# Patient Record
Sex: Female | Born: 1975
Health system: Southern US, Community
[De-identification: ages and names within clinical notes are randomized; demographics above are authoritative.]

## PROBLEM LIST (undated history)

## (undated) DIAGNOSIS — F419 Anxiety disorder, unspecified: Secondary | ICD-10-CM

## (undated) DIAGNOSIS — F309 Manic episode, unspecified: Secondary | ICD-10-CM

## (undated) DIAGNOSIS — F4310 Post-traumatic stress disorder, unspecified: Secondary | ICD-10-CM

## (undated) DIAGNOSIS — F329 Major depressive disorder, single episode, unspecified: Secondary | ICD-10-CM

## (undated) DIAGNOSIS — Z9989 Dependence on other enabling machines and devices: Secondary | ICD-10-CM

## (undated) DIAGNOSIS — R51 Headache: Secondary | ICD-10-CM

## (undated) DIAGNOSIS — E785 Hyperlipidemia, unspecified: Secondary | ICD-10-CM

## (undated) DIAGNOSIS — I10 Essential (primary) hypertension: Secondary | ICD-10-CM

## (undated) DIAGNOSIS — E669 Obesity, unspecified: Secondary | ICD-10-CM

## (undated) DIAGNOSIS — F29 Unspecified psychosis not due to a substance or known physiological condition: Secondary | ICD-10-CM

## (undated) DIAGNOSIS — G4733 Obstructive sleep apnea (adult) (pediatric): Secondary | ICD-10-CM

## (undated) DIAGNOSIS — F32A Depression, unspecified: Secondary | ICD-10-CM

## (undated) DIAGNOSIS — G932 Benign intracranial hypertension: Secondary | ICD-10-CM

## (undated) DIAGNOSIS — F840 Autistic disorder: Secondary | ICD-10-CM

## (undated) DIAGNOSIS — G4730 Sleep apnea, unspecified: Secondary | ICD-10-CM

## (undated) DIAGNOSIS — F819 Developmental disorder of scholastic skills, unspecified: Secondary | ICD-10-CM

## (undated) DIAGNOSIS — M545 Low back pain, unspecified: Secondary | ICD-10-CM

## (undated) DIAGNOSIS — M25559 Pain in unspecified hip: Secondary | ICD-10-CM

## (undated) DIAGNOSIS — F909 Attention-deficit hyperactivity disorder, unspecified type: Secondary | ICD-10-CM

## (undated) HISTORY — PX: FRACTURE SURGERY: SHX138

## (undated) HISTORY — DX: Major depressive disorder, single episode, unspecified: F32.9

## (undated) HISTORY — PX: CHOLECYSTECTOMY: SHX55

## (undated) HISTORY — DX: Essential (primary) hypertension: I10

## (undated) HISTORY — DX: Hyperlipidemia, unspecified: E78.5

## (undated) HISTORY — DX: Obesity, unspecified: E66.9

## (undated) HISTORY — DX: Post-traumatic stress disorder, unspecified: F43.10

## (undated) HISTORY — DX: Headache: R51

## (undated) HISTORY — DX: Manic episode, unspecified: F30.9

## (undated) HISTORY — DX: Depression, unspecified: F32.A

## (undated) HISTORY — DX: Dependence on other enabling machines and devices: Z99.89

## (undated) HISTORY — DX: Anxiety disorder, unspecified: F41.9

## (undated) HISTORY — DX: Obstructive sleep apnea (adult) (pediatric): G47.33

## (undated) HISTORY — DX: Unspecified psychosis not due to a substance or known physiological condition: F29

## (undated) HISTORY — DX: Low back pain, unspecified: M54.50

## (undated) HISTORY — DX: Sleep apnea, unspecified: G47.30

## (undated) HISTORY — DX: Autistic disorder: F84.0

## (undated) HISTORY — PX: ANKLE SURGERY: SHX546

## (undated) HISTORY — DX: Attention-deficit hyperactivity disorder, unspecified type: F90.9

## (undated) HISTORY — PX: HX GASTRIC SLEEVE: 2100003104

## (undated) HISTORY — DX: Pain in unspecified hip: M25.559

## (undated) HISTORY — DX: Developmental disorder of scholastic skills, unspecified: F81.9

## (undated) HISTORY — DX: Benign intracranial hypertension: G93.2

---

## 1997-09-23 ENCOUNTER — Emergency Department (HOSPITAL_COMMUNITY): Admission: EM | Admit: 1997-09-23 | Discharge: 1997-09-23 | Payer: Self-pay | Admitting: Emergency Medicine

## 1998-05-27 HISTORY — PX: OTHER SURGICAL HISTORY: SHX169

## 2005-05-27 HISTORY — PX: GALLBLADDER SURGERY: SHX652

## 2006-01-07 ENCOUNTER — Emergency Department (HOSPITAL_COMMUNITY): Admission: EM | Admit: 2006-01-07 | Discharge: 2006-01-07 | Payer: Self-pay | Admitting: Emergency Medicine

## 2006-01-09 ENCOUNTER — Inpatient Hospital Stay (HOSPITAL_COMMUNITY): Admission: EM | Admit: 2006-01-09 | Discharge: 2006-01-11 | Payer: Self-pay | Admitting: Emergency Medicine

## 2006-01-10 ENCOUNTER — Encounter (INDEPENDENT_AMBULATORY_CARE_PROVIDER_SITE_OTHER): Payer: Self-pay | Admitting: *Deleted

## 2006-01-22 ENCOUNTER — Ambulatory Visit (HOSPITAL_COMMUNITY): Payer: Self-pay | Admitting: Psychiatry

## 2006-02-05 ENCOUNTER — Ambulatory Visit (HOSPITAL_COMMUNITY): Payer: Self-pay | Admitting: Psychiatry

## 2006-02-19 ENCOUNTER — Ambulatory Visit (HOSPITAL_COMMUNITY): Payer: Self-pay | Admitting: Psychiatry

## 2006-06-04 ENCOUNTER — Ambulatory Visit (HOSPITAL_COMMUNITY): Payer: Self-pay | Admitting: Psychiatry

## 2006-07-14 ENCOUNTER — Ambulatory Visit (HOSPITAL_COMMUNITY): Payer: Self-pay | Admitting: Psychiatry

## 2006-07-31 ENCOUNTER — Ambulatory Visit (HOSPITAL_COMMUNITY): Payer: Self-pay | Admitting: Licensed Clinical Social Worker

## 2006-08-07 ENCOUNTER — Ambulatory Visit (HOSPITAL_COMMUNITY): Payer: Self-pay | Admitting: Licensed Clinical Social Worker

## 2006-08-25 ENCOUNTER — Ambulatory Visit (HOSPITAL_COMMUNITY): Payer: Self-pay | Admitting: Licensed Clinical Social Worker

## 2006-09-08 ENCOUNTER — Ambulatory Visit (HOSPITAL_COMMUNITY): Payer: Self-pay | Admitting: Psychiatry

## 2006-09-09 ENCOUNTER — Ambulatory Visit (HOSPITAL_COMMUNITY): Payer: Self-pay | Admitting: Licensed Clinical Social Worker

## 2006-09-16 ENCOUNTER — Ambulatory Visit (HOSPITAL_COMMUNITY): Payer: Self-pay | Admitting: Licensed Clinical Social Worker

## 2006-10-01 ENCOUNTER — Ambulatory Visit (HOSPITAL_COMMUNITY): Payer: Self-pay | Admitting: Licensed Clinical Social Worker

## 2006-10-21 ENCOUNTER — Ambulatory Visit (HOSPITAL_COMMUNITY): Payer: Self-pay | Admitting: Licensed Clinical Social Worker

## 2006-10-30 ENCOUNTER — Ambulatory Visit (HOSPITAL_COMMUNITY): Payer: Self-pay | Admitting: Licensed Clinical Social Worker

## 2006-11-10 ENCOUNTER — Ambulatory Visit (HOSPITAL_COMMUNITY): Payer: Self-pay | Admitting: Psychiatry

## 2006-11-12 ENCOUNTER — Ambulatory Visit (HOSPITAL_COMMUNITY): Payer: Self-pay | Admitting: Licensed Clinical Social Worker

## 2006-11-18 ENCOUNTER — Ambulatory Visit (HOSPITAL_COMMUNITY): Payer: Self-pay | Admitting: Licensed Clinical Social Worker

## 2006-11-25 ENCOUNTER — Ambulatory Visit (HOSPITAL_COMMUNITY): Payer: Self-pay | Admitting: Licensed Clinical Social Worker

## 2006-12-02 ENCOUNTER — Ambulatory Visit (HOSPITAL_COMMUNITY): Payer: Self-pay | Admitting: Licensed Clinical Social Worker

## 2006-12-09 ENCOUNTER — Ambulatory Visit (HOSPITAL_COMMUNITY): Payer: Self-pay | Admitting: Licensed Clinical Social Worker

## 2006-12-16 ENCOUNTER — Ambulatory Visit (HOSPITAL_COMMUNITY): Payer: Self-pay | Admitting: Licensed Clinical Social Worker

## 2006-12-24 ENCOUNTER — Ambulatory Visit (HOSPITAL_COMMUNITY): Payer: Self-pay | Admitting: Licensed Clinical Social Worker

## 2006-12-31 ENCOUNTER — Ambulatory Visit (HOSPITAL_COMMUNITY): Payer: Self-pay | Admitting: Licensed Clinical Social Worker

## 2007-01-05 ENCOUNTER — Ambulatory Visit (HOSPITAL_COMMUNITY): Payer: Self-pay | Admitting: Psychiatry

## 2007-01-07 ENCOUNTER — Ambulatory Visit (HOSPITAL_COMMUNITY): Payer: Self-pay | Admitting: Licensed Clinical Social Worker

## 2007-01-21 ENCOUNTER — Ambulatory Visit (HOSPITAL_COMMUNITY): Payer: Self-pay | Admitting: Licensed Clinical Social Worker

## 2007-02-04 ENCOUNTER — Ambulatory Visit (HOSPITAL_COMMUNITY): Payer: Self-pay | Admitting: Psychiatry

## 2007-02-10 ENCOUNTER — Ambulatory Visit (HOSPITAL_COMMUNITY): Payer: Self-pay | Admitting: Licensed Clinical Social Worker

## 2007-02-17 ENCOUNTER — Ambulatory Visit (HOSPITAL_COMMUNITY): Payer: Self-pay | Admitting: Licensed Clinical Social Worker

## 2007-02-23 ENCOUNTER — Ambulatory Visit (HOSPITAL_COMMUNITY): Payer: Self-pay | Admitting: Licensed Clinical Social Worker

## 2007-03-03 ENCOUNTER — Ambulatory Visit (HOSPITAL_COMMUNITY): Payer: Self-pay | Admitting: Licensed Clinical Social Worker

## 2007-03-04 ENCOUNTER — Ambulatory Visit (HOSPITAL_COMMUNITY): Payer: Self-pay | Admitting: Psychiatry

## 2007-03-10 ENCOUNTER — Ambulatory Visit (HOSPITAL_COMMUNITY): Payer: Self-pay | Admitting: Licensed Clinical Social Worker

## 2007-03-17 ENCOUNTER — Ambulatory Visit (HOSPITAL_COMMUNITY): Payer: Self-pay | Admitting: Licensed Clinical Social Worker

## 2007-03-31 ENCOUNTER — Ambulatory Visit (HOSPITAL_COMMUNITY): Payer: Self-pay | Admitting: Licensed Clinical Social Worker

## 2007-04-03 ENCOUNTER — Ambulatory Visit (HOSPITAL_COMMUNITY): Payer: Self-pay | Admitting: Psychiatry

## 2007-04-07 ENCOUNTER — Ambulatory Visit (HOSPITAL_COMMUNITY): Payer: Self-pay | Admitting: Licensed Clinical Social Worker

## 2007-04-14 ENCOUNTER — Ambulatory Visit (HOSPITAL_COMMUNITY): Payer: Self-pay | Admitting: Licensed Clinical Social Worker

## 2007-04-21 ENCOUNTER — Ambulatory Visit (HOSPITAL_COMMUNITY): Payer: Self-pay | Admitting: Licensed Clinical Social Worker

## 2007-04-27 ENCOUNTER — Ambulatory Visit (HOSPITAL_COMMUNITY): Payer: Self-pay | Admitting: Licensed Clinical Social Worker

## 2007-05-04 ENCOUNTER — Ambulatory Visit (HOSPITAL_COMMUNITY): Payer: Self-pay | Admitting: Licensed Clinical Social Worker

## 2007-05-08 ENCOUNTER — Ambulatory Visit (HOSPITAL_COMMUNITY): Payer: Self-pay | Admitting: Psychiatry

## 2007-05-11 ENCOUNTER — Ambulatory Visit (HOSPITAL_COMMUNITY): Payer: Self-pay | Admitting: Licensed Clinical Social Worker

## 2007-05-18 ENCOUNTER — Ambulatory Visit (HOSPITAL_COMMUNITY): Payer: Self-pay | Admitting: Licensed Clinical Social Worker

## 2007-05-25 ENCOUNTER — Ambulatory Visit (HOSPITAL_COMMUNITY): Payer: Self-pay | Admitting: Licensed Clinical Social Worker

## 2007-06-15 ENCOUNTER — Ambulatory Visit (HOSPITAL_COMMUNITY): Payer: Self-pay | Admitting: Licensed Clinical Social Worker

## 2007-06-25 ENCOUNTER — Ambulatory Visit (HOSPITAL_COMMUNITY): Payer: Self-pay | Admitting: Licensed Clinical Social Worker

## 2007-07-02 ENCOUNTER — Ambulatory Visit (HOSPITAL_COMMUNITY): Payer: Self-pay | Admitting: Licensed Clinical Social Worker

## 2007-07-23 ENCOUNTER — Ambulatory Visit (HOSPITAL_COMMUNITY): Payer: Self-pay | Admitting: Licensed Clinical Social Worker

## 2007-07-29 ENCOUNTER — Ambulatory Visit (HOSPITAL_COMMUNITY): Payer: Self-pay | Admitting: Licensed Clinical Social Worker

## 2007-08-05 ENCOUNTER — Ambulatory Visit (HOSPITAL_COMMUNITY): Payer: Self-pay | Admitting: Licensed Clinical Social Worker

## 2007-08-12 ENCOUNTER — Ambulatory Visit (HOSPITAL_COMMUNITY): Payer: Self-pay | Admitting: Licensed Clinical Social Worker

## 2007-08-19 ENCOUNTER — Ambulatory Visit (HOSPITAL_COMMUNITY): Payer: Self-pay | Admitting: Licensed Clinical Social Worker

## 2007-08-26 ENCOUNTER — Ambulatory Visit (HOSPITAL_COMMUNITY): Payer: Self-pay | Admitting: Licensed Clinical Social Worker

## 2007-09-02 ENCOUNTER — Ambulatory Visit (HOSPITAL_COMMUNITY): Payer: Self-pay | Admitting: Licensed Clinical Social Worker

## 2007-09-09 ENCOUNTER — Ambulatory Visit (HOSPITAL_COMMUNITY): Payer: Self-pay | Admitting: Licensed Clinical Social Worker

## 2007-09-30 ENCOUNTER — Ambulatory Visit (HOSPITAL_COMMUNITY): Payer: Self-pay | Admitting: Licensed Clinical Social Worker

## 2007-10-12 ENCOUNTER — Ambulatory Visit (HOSPITAL_COMMUNITY): Payer: Self-pay | Admitting: Licensed Clinical Social Worker

## 2007-10-20 ENCOUNTER — Ambulatory Visit (HOSPITAL_COMMUNITY): Payer: Self-pay | Admitting: Licensed Clinical Social Worker

## 2007-10-26 ENCOUNTER — Ambulatory Visit (HOSPITAL_COMMUNITY): Payer: Self-pay | Admitting: Licensed Clinical Social Worker

## 2007-11-02 ENCOUNTER — Ambulatory Visit (HOSPITAL_COMMUNITY): Payer: Self-pay | Admitting: Licensed Clinical Social Worker

## 2007-11-09 ENCOUNTER — Ambulatory Visit (HOSPITAL_COMMUNITY): Payer: Self-pay | Admitting: Licensed Clinical Social Worker

## 2007-11-16 ENCOUNTER — Ambulatory Visit (HOSPITAL_COMMUNITY): Payer: Self-pay | Admitting: Licensed Clinical Social Worker

## 2007-12-07 ENCOUNTER — Ambulatory Visit (HOSPITAL_COMMUNITY): Payer: Self-pay | Admitting: Licensed Clinical Social Worker

## 2007-12-14 ENCOUNTER — Ambulatory Visit (HOSPITAL_COMMUNITY): Payer: Self-pay | Admitting: Licensed Clinical Social Worker

## 2007-12-21 ENCOUNTER — Ambulatory Visit (HOSPITAL_COMMUNITY): Payer: Self-pay | Admitting: Licensed Clinical Social Worker

## 2007-12-28 ENCOUNTER — Ambulatory Visit (HOSPITAL_COMMUNITY): Payer: Self-pay | Admitting: Licensed Clinical Social Worker

## 2008-01-04 ENCOUNTER — Ambulatory Visit (HOSPITAL_COMMUNITY): Payer: Self-pay | Admitting: Licensed Clinical Social Worker

## 2008-01-08 ENCOUNTER — Ambulatory Visit (HOSPITAL_COMMUNITY): Payer: Self-pay | Admitting: Psychiatry

## 2008-01-12 ENCOUNTER — Ambulatory Visit (HOSPITAL_COMMUNITY): Payer: Self-pay | Admitting: Licensed Clinical Social Worker

## 2008-01-18 ENCOUNTER — Ambulatory Visit (HOSPITAL_COMMUNITY): Payer: Self-pay | Admitting: Licensed Clinical Social Worker

## 2008-01-25 ENCOUNTER — Ambulatory Visit (HOSPITAL_COMMUNITY): Payer: Self-pay | Admitting: Licensed Clinical Social Worker

## 2008-02-08 ENCOUNTER — Ambulatory Visit (HOSPITAL_COMMUNITY): Payer: Self-pay | Admitting: Licensed Clinical Social Worker

## 2008-02-15 ENCOUNTER — Ambulatory Visit (HOSPITAL_COMMUNITY): Payer: Self-pay | Admitting: Licensed Clinical Social Worker

## 2008-02-22 ENCOUNTER — Ambulatory Visit (HOSPITAL_COMMUNITY): Payer: Self-pay | Admitting: Licensed Clinical Social Worker

## 2008-02-29 ENCOUNTER — Ambulatory Visit (HOSPITAL_COMMUNITY): Payer: Self-pay | Admitting: Licensed Clinical Social Worker

## 2008-03-02 ENCOUNTER — Ambulatory Visit (HOSPITAL_COMMUNITY): Payer: Self-pay | Admitting: Psychiatry

## 2008-03-07 ENCOUNTER — Ambulatory Visit (HOSPITAL_COMMUNITY): Payer: Self-pay | Admitting: Licensed Clinical Social Worker

## 2008-03-14 ENCOUNTER — Ambulatory Visit (HOSPITAL_COMMUNITY): Payer: Self-pay | Admitting: Licensed Clinical Social Worker

## 2008-03-21 ENCOUNTER — Ambulatory Visit (HOSPITAL_COMMUNITY): Payer: Self-pay | Admitting: Licensed Clinical Social Worker

## 2008-03-28 ENCOUNTER — Ambulatory Visit (HOSPITAL_COMMUNITY): Payer: Self-pay | Admitting: Licensed Clinical Social Worker

## 2008-04-04 ENCOUNTER — Ambulatory Visit (HOSPITAL_COMMUNITY): Payer: Self-pay | Admitting: Licensed Clinical Social Worker

## 2008-04-11 ENCOUNTER — Ambulatory Visit (HOSPITAL_COMMUNITY): Payer: Self-pay | Admitting: Licensed Clinical Social Worker

## 2008-04-18 ENCOUNTER — Ambulatory Visit (HOSPITAL_COMMUNITY): Payer: Self-pay | Admitting: Licensed Clinical Social Worker

## 2008-04-25 ENCOUNTER — Ambulatory Visit (HOSPITAL_COMMUNITY): Payer: Self-pay | Admitting: Licensed Clinical Social Worker

## 2008-04-27 ENCOUNTER — Ambulatory Visit (HOSPITAL_COMMUNITY): Payer: Self-pay | Admitting: Psychiatry

## 2008-05-02 ENCOUNTER — Ambulatory Visit (HOSPITAL_COMMUNITY): Payer: Self-pay | Admitting: Licensed Clinical Social Worker

## 2008-05-16 ENCOUNTER — Ambulatory Visit (HOSPITAL_COMMUNITY): Payer: Self-pay | Admitting: Licensed Clinical Social Worker

## 2008-05-23 ENCOUNTER — Ambulatory Visit (HOSPITAL_COMMUNITY): Payer: Self-pay | Admitting: Licensed Clinical Social Worker

## 2008-06-01 ENCOUNTER — Ambulatory Visit (HOSPITAL_COMMUNITY): Payer: Self-pay | Admitting: Licensed Clinical Social Worker

## 2008-06-07 ENCOUNTER — Ambulatory Visit (HOSPITAL_COMMUNITY): Payer: Self-pay | Admitting: Licensed Clinical Social Worker

## 2008-06-15 ENCOUNTER — Ambulatory Visit (HOSPITAL_COMMUNITY): Payer: Self-pay | Admitting: Licensed Clinical Social Worker

## 2008-06-20 ENCOUNTER — Ambulatory Visit (HOSPITAL_COMMUNITY): Payer: Self-pay | Admitting: Psychiatry

## 2008-06-22 ENCOUNTER — Ambulatory Visit (HOSPITAL_COMMUNITY): Payer: Self-pay | Admitting: Licensed Clinical Social Worker

## 2008-07-06 ENCOUNTER — Ambulatory Visit (HOSPITAL_COMMUNITY): Payer: Self-pay | Admitting: Licensed Clinical Social Worker

## 2008-07-20 ENCOUNTER — Ambulatory Visit (HOSPITAL_COMMUNITY): Payer: Self-pay | Admitting: Licensed Clinical Social Worker

## 2008-07-27 ENCOUNTER — Ambulatory Visit (HOSPITAL_COMMUNITY): Payer: Self-pay | Admitting: Licensed Clinical Social Worker

## 2008-08-17 ENCOUNTER — Ambulatory Visit (HOSPITAL_COMMUNITY): Payer: Self-pay | Admitting: Licensed Clinical Social Worker

## 2008-08-24 ENCOUNTER — Ambulatory Visit (HOSPITAL_COMMUNITY): Payer: Self-pay | Admitting: Licensed Clinical Social Worker

## 2008-08-31 ENCOUNTER — Ambulatory Visit (HOSPITAL_COMMUNITY): Payer: Self-pay | Admitting: Licensed Clinical Social Worker

## 2008-09-07 ENCOUNTER — Ambulatory Visit (HOSPITAL_COMMUNITY): Payer: Self-pay | Admitting: Licensed Clinical Social Worker

## 2008-09-14 ENCOUNTER — Ambulatory Visit (HOSPITAL_COMMUNITY): Payer: Self-pay | Admitting: Licensed Clinical Social Worker

## 2008-09-30 ENCOUNTER — Ambulatory Visit (HOSPITAL_COMMUNITY): Payer: Self-pay | Admitting: Licensed Clinical Social Worker

## 2008-10-07 ENCOUNTER — Ambulatory Visit (HOSPITAL_COMMUNITY): Payer: Self-pay | Admitting: Licensed Clinical Social Worker

## 2008-10-13 ENCOUNTER — Ambulatory Visit (HOSPITAL_COMMUNITY): Payer: Self-pay | Admitting: Licensed Clinical Social Worker

## 2008-10-18 ENCOUNTER — Ambulatory Visit (HOSPITAL_COMMUNITY): Payer: Self-pay | Admitting: Licensed Clinical Social Worker

## 2008-10-25 ENCOUNTER — Ambulatory Visit (HOSPITAL_COMMUNITY): Payer: Self-pay | Admitting: Licensed Clinical Social Worker

## 2008-11-01 ENCOUNTER — Ambulatory Visit (HOSPITAL_COMMUNITY): Payer: Self-pay | Admitting: Licensed Clinical Social Worker

## 2008-11-08 ENCOUNTER — Ambulatory Visit (HOSPITAL_COMMUNITY): Payer: Self-pay | Admitting: Licensed Clinical Social Worker

## 2008-11-11 ENCOUNTER — Ambulatory Visit (HOSPITAL_COMMUNITY): Payer: Self-pay | Admitting: Licensed Clinical Social Worker

## 2008-11-15 ENCOUNTER — Ambulatory Visit (HOSPITAL_COMMUNITY): Payer: Self-pay | Admitting: Licensed Clinical Social Worker

## 2008-11-29 ENCOUNTER — Ambulatory Visit (HOSPITAL_COMMUNITY): Payer: Self-pay | Admitting: Licensed Clinical Social Worker

## 2008-12-06 ENCOUNTER — Ambulatory Visit (HOSPITAL_COMMUNITY): Payer: Self-pay | Admitting: Licensed Clinical Social Worker

## 2008-12-13 ENCOUNTER — Ambulatory Visit (HOSPITAL_COMMUNITY): Payer: Self-pay | Admitting: Licensed Clinical Social Worker

## 2008-12-20 ENCOUNTER — Ambulatory Visit (HOSPITAL_COMMUNITY): Payer: Self-pay | Admitting: Licensed Clinical Social Worker

## 2008-12-27 ENCOUNTER — Ambulatory Visit (HOSPITAL_COMMUNITY): Payer: Self-pay | Admitting: Licensed Clinical Social Worker

## 2009-01-04 ENCOUNTER — Ambulatory Visit (HOSPITAL_COMMUNITY): Payer: Self-pay | Admitting: Licensed Clinical Social Worker

## 2009-01-10 ENCOUNTER — Ambulatory Visit (HOSPITAL_COMMUNITY): Payer: Self-pay | Admitting: Licensed Clinical Social Worker

## 2009-01-16 ENCOUNTER — Ambulatory Visit (HOSPITAL_COMMUNITY): Payer: Self-pay | Admitting: Licensed Clinical Social Worker

## 2009-01-23 ENCOUNTER — Ambulatory Visit (HOSPITAL_COMMUNITY): Payer: Self-pay | Admitting: Licensed Clinical Social Worker

## 2009-02-01 ENCOUNTER — Ambulatory Visit (HOSPITAL_COMMUNITY): Payer: Self-pay | Admitting: Licensed Clinical Social Worker

## 2009-02-08 ENCOUNTER — Ambulatory Visit (HOSPITAL_COMMUNITY): Payer: Self-pay | Admitting: Licensed Clinical Social Worker

## 2009-02-22 ENCOUNTER — Ambulatory Visit (HOSPITAL_COMMUNITY): Payer: Self-pay | Admitting: Licensed Clinical Social Worker

## 2009-03-01 ENCOUNTER — Ambulatory Visit (HOSPITAL_COMMUNITY): Payer: Self-pay | Admitting: Licensed Clinical Social Worker

## 2009-03-08 ENCOUNTER — Ambulatory Visit (HOSPITAL_COMMUNITY): Payer: Self-pay | Admitting: Licensed Clinical Social Worker

## 2009-03-15 ENCOUNTER — Ambulatory Visit (HOSPITAL_COMMUNITY): Payer: Self-pay | Admitting: Licensed Clinical Social Worker

## 2009-03-19 ENCOUNTER — Emergency Department (HOSPITAL_COMMUNITY): Admission: EM | Admit: 2009-03-19 | Discharge: 2009-03-19 | Payer: Self-pay | Admitting: Emergency Medicine

## 2009-03-29 ENCOUNTER — Ambulatory Visit (HOSPITAL_COMMUNITY): Payer: Self-pay | Admitting: Licensed Clinical Social Worker

## 2009-04-05 ENCOUNTER — Ambulatory Visit (HOSPITAL_COMMUNITY): Payer: Self-pay | Admitting: Licensed Clinical Social Worker

## 2009-04-12 ENCOUNTER — Ambulatory Visit (HOSPITAL_COMMUNITY): Payer: Self-pay | Admitting: Licensed Clinical Social Worker

## 2009-04-19 ENCOUNTER — Ambulatory Visit (HOSPITAL_COMMUNITY): Payer: Self-pay | Admitting: Licensed Clinical Social Worker

## 2009-05-04 ENCOUNTER — Ambulatory Visit (HOSPITAL_COMMUNITY): Payer: Self-pay | Admitting: Licensed Clinical Social Worker

## 2009-05-10 ENCOUNTER — Ambulatory Visit (HOSPITAL_COMMUNITY): Payer: Self-pay | Admitting: Licensed Clinical Social Worker

## 2009-05-17 ENCOUNTER — Ambulatory Visit (HOSPITAL_COMMUNITY): Payer: Self-pay | Admitting: Licensed Clinical Social Worker

## 2009-05-24 ENCOUNTER — Ambulatory Visit (HOSPITAL_COMMUNITY): Payer: Self-pay | Admitting: Licensed Clinical Social Worker

## 2009-06-01 ENCOUNTER — Ambulatory Visit (HOSPITAL_COMMUNITY): Payer: Self-pay | Admitting: Licensed Clinical Social Worker

## 2009-06-08 ENCOUNTER — Ambulatory Visit (HOSPITAL_COMMUNITY): Payer: Self-pay | Admitting: Licensed Clinical Social Worker

## 2009-06-08 ENCOUNTER — Ambulatory Visit (HOSPITAL_COMMUNITY): Admission: RE | Admit: 2009-06-08 | Discharge: 2009-06-08 | Payer: Self-pay | Admitting: Obstetrics and Gynecology

## 2009-06-15 ENCOUNTER — Ambulatory Visit (HOSPITAL_COMMUNITY): Payer: Self-pay | Admitting: Licensed Clinical Social Worker

## 2009-06-22 ENCOUNTER — Ambulatory Visit (HOSPITAL_COMMUNITY): Payer: Self-pay | Admitting: Licensed Clinical Social Worker

## 2009-06-29 ENCOUNTER — Ambulatory Visit (HOSPITAL_COMMUNITY): Payer: Self-pay | Admitting: Licensed Clinical Social Worker

## 2009-07-06 ENCOUNTER — Ambulatory Visit (HOSPITAL_COMMUNITY): Payer: Self-pay | Admitting: Licensed Clinical Social Worker

## 2009-07-28 ENCOUNTER — Ambulatory Visit (HOSPITAL_COMMUNITY): Payer: Self-pay | Admitting: Licensed Clinical Social Worker

## 2009-08-04 ENCOUNTER — Ambulatory Visit (HOSPITAL_COMMUNITY): Payer: Self-pay | Admitting: Licensed Clinical Social Worker

## 2009-08-10 ENCOUNTER — Ambulatory Visit (HOSPITAL_COMMUNITY): Payer: Self-pay | Admitting: Licensed Clinical Social Worker

## 2009-08-25 ENCOUNTER — Ambulatory Visit (HOSPITAL_COMMUNITY): Payer: Self-pay | Admitting: Licensed Clinical Social Worker

## 2009-08-31 ENCOUNTER — Ambulatory Visit (HOSPITAL_COMMUNITY): Payer: Self-pay | Admitting: Licensed Clinical Social Worker

## 2009-09-07 ENCOUNTER — Ambulatory Visit (HOSPITAL_COMMUNITY): Payer: Self-pay | Admitting: Licensed Clinical Social Worker

## 2009-09-11 ENCOUNTER — Ambulatory Visit (HOSPITAL_COMMUNITY): Payer: Self-pay | Admitting: Licensed Clinical Social Worker

## 2009-09-18 ENCOUNTER — Ambulatory Visit (HOSPITAL_COMMUNITY): Payer: Self-pay | Admitting: Licensed Clinical Social Worker

## 2009-10-06 ENCOUNTER — Ambulatory Visit (HOSPITAL_COMMUNITY): Payer: Self-pay | Admitting: Licensed Clinical Social Worker

## 2009-10-12 ENCOUNTER — Ambulatory Visit (HOSPITAL_COMMUNITY): Payer: Self-pay | Admitting: Licensed Clinical Social Worker

## 2009-10-17 ENCOUNTER — Ambulatory Visit (HOSPITAL_COMMUNITY): Payer: Self-pay | Admitting: Licensed Clinical Social Worker

## 2009-10-25 ENCOUNTER — Ambulatory Visit (HOSPITAL_COMMUNITY): Payer: Self-pay | Admitting: Licensed Clinical Social Worker

## 2009-10-31 ENCOUNTER — Ambulatory Visit (HOSPITAL_COMMUNITY): Payer: Self-pay | Admitting: Licensed Clinical Social Worker

## 2009-11-07 ENCOUNTER — Ambulatory Visit (HOSPITAL_COMMUNITY): Payer: Self-pay | Admitting: Licensed Clinical Social Worker

## 2009-11-14 ENCOUNTER — Ambulatory Visit (HOSPITAL_COMMUNITY): Payer: Self-pay | Admitting: Licensed Clinical Social Worker

## 2009-11-23 ENCOUNTER — Ambulatory Visit (HOSPITAL_COMMUNITY): Payer: Self-pay | Admitting: Licensed Clinical Social Worker

## 2009-11-28 ENCOUNTER — Ambulatory Visit (HOSPITAL_COMMUNITY): Payer: Self-pay | Admitting: Licensed Clinical Social Worker

## 2009-12-07 ENCOUNTER — Ambulatory Visit (HOSPITAL_COMMUNITY): Payer: Self-pay | Admitting: Licensed Clinical Social Worker

## 2009-12-14 ENCOUNTER — Ambulatory Visit (HOSPITAL_COMMUNITY): Payer: Self-pay | Admitting: Licensed Clinical Social Worker

## 2009-12-20 ENCOUNTER — Ambulatory Visit (HOSPITAL_COMMUNITY): Payer: Self-pay | Admitting: Psychiatry

## 2009-12-21 ENCOUNTER — Ambulatory Visit (HOSPITAL_COMMUNITY): Payer: Self-pay | Admitting: Licensed Clinical Social Worker

## 2009-12-27 ENCOUNTER — Ambulatory Visit (HOSPITAL_COMMUNITY): Payer: Self-pay | Admitting: Licensed Clinical Social Worker

## 2010-01-04 ENCOUNTER — Ambulatory Visit (HOSPITAL_COMMUNITY): Payer: Self-pay | Admitting: Licensed Clinical Social Worker

## 2010-01-08 ENCOUNTER — Ambulatory Visit (HOSPITAL_COMMUNITY): Payer: Self-pay | Admitting: Licensed Clinical Social Worker

## 2010-01-16 ENCOUNTER — Ambulatory Visit (HOSPITAL_COMMUNITY): Payer: Self-pay | Admitting: Licensed Clinical Social Worker

## 2010-01-23 ENCOUNTER — Ambulatory Visit (HOSPITAL_COMMUNITY): Payer: Self-pay | Admitting: Licensed Clinical Social Worker

## 2010-02-01 ENCOUNTER — Ambulatory Visit (HOSPITAL_COMMUNITY): Payer: Self-pay | Admitting: Licensed Clinical Social Worker

## 2010-02-06 ENCOUNTER — Ambulatory Visit (HOSPITAL_COMMUNITY): Payer: Self-pay | Admitting: Licensed Clinical Social Worker

## 2010-02-16 ENCOUNTER — Ambulatory Visit (HOSPITAL_COMMUNITY): Payer: Self-pay | Admitting: Licensed Clinical Social Worker

## 2010-02-27 ENCOUNTER — Ambulatory Visit (HOSPITAL_COMMUNITY): Payer: Self-pay | Admitting: Licensed Clinical Social Worker

## 2010-03-06 ENCOUNTER — Ambulatory Visit (HOSPITAL_COMMUNITY): Payer: Self-pay | Admitting: Licensed Clinical Social Worker

## 2010-03-13 ENCOUNTER — Ambulatory Visit (HOSPITAL_COMMUNITY): Payer: Self-pay | Admitting: Licensed Clinical Social Worker

## 2010-03-23 ENCOUNTER — Ambulatory Visit (HOSPITAL_COMMUNITY): Payer: Self-pay | Admitting: Licensed Clinical Social Worker

## 2010-03-29 ENCOUNTER — Ambulatory Visit (HOSPITAL_COMMUNITY): Payer: Self-pay | Admitting: Licensed Clinical Social Worker

## 2010-04-05 ENCOUNTER — Ambulatory Visit (HOSPITAL_COMMUNITY): Payer: Self-pay | Admitting: Licensed Clinical Social Worker

## 2010-04-12 ENCOUNTER — Ambulatory Visit (HOSPITAL_COMMUNITY): Payer: Self-pay | Admitting: Licensed Clinical Social Worker

## 2010-04-17 ENCOUNTER — Ambulatory Visit (HOSPITAL_COMMUNITY): Payer: Self-pay | Admitting: Licensed Clinical Social Worker

## 2010-04-23 ENCOUNTER — Ambulatory Visit (HOSPITAL_COMMUNITY): Payer: Self-pay | Admitting: Licensed Clinical Social Worker

## 2010-05-01 ENCOUNTER — Ambulatory Visit (HOSPITAL_COMMUNITY): Payer: Self-pay | Admitting: Licensed Clinical Social Worker

## 2010-05-08 ENCOUNTER — Ambulatory Visit (HOSPITAL_COMMUNITY): Payer: Self-pay | Admitting: Licensed Clinical Social Worker

## 2010-05-15 ENCOUNTER — Ambulatory Visit (HOSPITAL_COMMUNITY): Payer: Self-pay | Admitting: Licensed Clinical Social Worker

## 2010-05-29 ENCOUNTER — Ambulatory Visit (HOSPITAL_COMMUNITY)
Admission: RE | Admit: 2010-05-29 | Discharge: 2010-05-29 | Payer: Self-pay | Source: Home / Self Care | Attending: Licensed Clinical Social Worker | Admitting: Licensed Clinical Social Worker

## 2010-06-04 ENCOUNTER — Ambulatory Visit (HOSPITAL_COMMUNITY)
Admission: RE | Admit: 2010-06-04 | Discharge: 2010-06-04 | Payer: Self-pay | Source: Home / Self Care | Attending: Licensed Clinical Social Worker | Admitting: Licensed Clinical Social Worker

## 2010-06-15 ENCOUNTER — Ambulatory Visit (HOSPITAL_COMMUNITY)
Admission: RE | Admit: 2010-06-15 | Discharge: 2010-06-15 | Payer: Self-pay | Source: Home / Self Care | Attending: Licensed Clinical Social Worker | Admitting: Licensed Clinical Social Worker

## 2010-06-16 ENCOUNTER — Encounter: Payer: Self-pay | Admitting: Neurology

## 2010-06-17 ENCOUNTER — Encounter: Payer: Self-pay | Admitting: Neurology

## 2010-06-22 ENCOUNTER — Ambulatory Visit (HOSPITAL_COMMUNITY)
Admission: RE | Admit: 2010-06-22 | Discharge: 2010-06-22 | Payer: Self-pay | Source: Home / Self Care | Attending: Licensed Clinical Social Worker | Admitting: Licensed Clinical Social Worker

## 2010-06-29 ENCOUNTER — Ambulatory Visit (HOSPITAL_COMMUNITY): Admit: 2010-06-29 | Payer: Self-pay | Admitting: Licensed Clinical Social Worker

## 2010-06-29 ENCOUNTER — Encounter (INDEPENDENT_AMBULATORY_CARE_PROVIDER_SITE_OTHER): Payer: Medicaid Other | Admitting: Licensed Clinical Social Worker

## 2010-07-05 ENCOUNTER — Encounter (HOSPITAL_COMMUNITY): Payer: Medicare Other | Admitting: Licensed Clinical Social Worker

## 2010-07-13 ENCOUNTER — Encounter (HOSPITAL_COMMUNITY): Payer: Medicare Other | Admitting: Licensed Clinical Social Worker

## 2010-08-03 ENCOUNTER — Encounter (HOSPITAL_COMMUNITY): Payer: Medicare Other | Admitting: Licensed Clinical Social Worker

## 2010-08-10 ENCOUNTER — Encounter (HOSPITAL_COMMUNITY): Payer: Medicare Other | Admitting: Licensed Clinical Social Worker

## 2010-08-17 ENCOUNTER — Encounter (HOSPITAL_COMMUNITY): Payer: Medicare Other | Admitting: Licensed Clinical Social Worker

## 2010-08-30 LAB — CBC
Platelets: 205 10*3/uL (ref 150–400)
RDW: 15.1 % (ref 11.5–15.5)
WBC: 8.7 10*3/uL (ref 4.0–10.5)

## 2010-08-30 LAB — BASIC METABOLIC PANEL
BUN: 10 mg/dL (ref 6–23)
Creatinine, Ser: 0.53 mg/dL (ref 0.4–1.2)
GFR calc non Af Amer: >60 mL/min (ref >60)
Glucose, Bld: 117 mg/dL — ABNORMAL HIGH (ref 70–99)

## 2010-08-30 LAB — DIFFERENTIAL
Basophils Absolute: 0.2 10*3/uL — ABNORMAL HIGH (ref 0.0–0.1)
Eosinophils Absolute: 0.2 10*3/uL (ref 0.0–0.7)
Eosinophils Relative: 3 % (ref 0–5)
Lymphocytes Relative: 26 % (ref 12–46)
Lymphs Abs: 2.2 10*3/uL (ref 0.7–4.0)
Neutrophils Relative %: 65 % (ref 43–77)

## 2010-08-30 LAB — D-DIMER, QUANTITATIVE: D-Dimer, Quant: 0.24 ug/mL-FEU (ref 0.00–0.48)

## 2010-08-31 ENCOUNTER — Encounter (HOSPITAL_COMMUNITY): Payer: Medicare Other | Admitting: Licensed Clinical Social Worker

## 2010-09-07 ENCOUNTER — Encounter (HOSPITAL_BASED_OUTPATIENT_CLINIC_OR_DEPARTMENT_OTHER): Payer: Medicare Other | Admitting: Licensed Clinical Social Worker

## 2010-09-13 ENCOUNTER — Encounter (HOSPITAL_BASED_OUTPATIENT_CLINIC_OR_DEPARTMENT_OTHER): Payer: Medicare Other | Admitting: Licensed Clinical Social Worker

## 2010-09-21 ENCOUNTER — Encounter (HOSPITAL_BASED_OUTPATIENT_CLINIC_OR_DEPARTMENT_OTHER): Payer: Medicare Other | Admitting: Licensed Clinical Social Worker

## 2010-09-27 ENCOUNTER — Encounter (HOSPITAL_BASED_OUTPATIENT_CLINIC_OR_DEPARTMENT_OTHER): Payer: Medicare Other | Admitting: Licensed Clinical Social Worker

## 2010-10-08 ENCOUNTER — Encounter (HOSPITAL_COMMUNITY): Payer: Medicare Other | Admitting: Licensed Clinical Social Worker

## 2010-10-12 NOTE — H&P (Signed)
NAMESAMMYE, STAFF             ACCOUNT NO.:  [id number]   MEDICAL RECORD NO.:  [id number]          PATIENT TYPE:  INP   LOCATION:  5153                         FACILITY:  [facility]   PHYSICIAN:  Cherylynn Ridges, M.D.    DATE OF BIRTH:  05-23-1976   DATE OF ADMISSION:  01/09/2006  DATE OF DISCHARGE:                                HISTORY & PHYSICAL   CHIEF COMPLAINT:  The patient is a 35 year old morbidly obese female with  acute cholecystitis and cholelithiasis.   HISTORY OF PRESENT ILLNESS:  The patient was seen in the emergency room  approximately 2-1/2 to 3 days ago with abdominal pain in the right upper  quadrant.  Workup at that time demonstrated the patient had gallstones.  However, she did not have any evidence of acute cholecystitis and she was  discharged to home with a follow-up appointment to see the surgeon next  week.   She went back in to see her primary care physician because she had failed to  have a bowel movement and was having increasing abdominal pain.  She was  sent in for a CT scan, which demonstrated thickening of the gallbladder wall  with some pericholecystic fluid along with gallstones.  Surgical  consultation was obtained.   PAST MEDICAL HISTORY:  1. Hypertension.  2. Schizoaffective disorder.  3. Hyperlipidemia.  4. Noninsulin-dependent diabetes mellitus.   CURRENT MEDICATIONS:  1. Lithium 450 mg b.i.d.  2. Clonidine 1 mg p.o. t.i.d.  3. Hydrochlorothiazide 25 mg daily.  4. Metformin 500 mg b.i.d.  5. Tricor, dosage is unknown.  6. Lortab for her pain.   ALLERGIES:  PENICILLIN apparently, IMITREX, VICODIN and TEGRETOL.   REVIEW OF SYSTEMS:  She has had no bowel movement in the last 48 hours.  She  has had some chills, no documented fever outside of a low-grade temperature  here.  She is in no acute distress.   PHYSICAL EXAMINATION:  VITAL SIGNS:  She has a temperature of 99.1.  Her  pulse is 89, blood pressure 118/62.  GENERAL:  She does not  appear to be in acute distress, but she did recently  get a dose of Dilaudid 2 mg.  HEENT:  She is normocephalic and atraumatic.  She does not appear to be  jaundiced.  She has no scleral icterus.  NECK:  Supple.  LUNGS:  Clear.  CARDIAC:  Regular rhythm and rate with no murmurs, gallops, lifts or heaves.  ABDOMEN:  Gelatinous, morbidly obese.  No palpable masses.  She has  decreased bowel sounds.  RECTAL, PELVIC:  Not performed.  NEUROLOGIC:  Cranial nerves II-XII are grossly intact.   I reviewed her liver function tests, which are normal.  Her white count is  12.7, hemoglobin 12.2, platelets are 314.  Electrolytes are all within  normal limits.  I reviewed the CT results, which demonstrate evidence of  cholecystitis.   I __________ for the patient to get Zosyn prior to knowing that she had an  allergy to PENICILLIN; therefore, she has been started o ciprofloxacin, of  which she has received one dose.  She has  been getting pain medicine and  only sips with medication.  She should go to the OR for a laparoscopic  cholecystectomy today.  I discussed this with the oncoming surgical team,  Dr. Lurene Shadow and the P.A., Revonda Standard, and she will likely get her gallbladder  taken out later today.      Cherylynn Ridges, M.D.  Electronically Signed     JOW/MEDQ  D:  01/10/2006  T:  01/10/2006  Job:  [id number]

## 2010-10-12 NOTE — Op Note (Signed)
Michaela Norris, Michaela Norris             ACCOUNT NO.:  [id number]   MEDICAL RECORD NO.:  [id number]          PATIENT TYPE:  INP   LOCATION:  5153                         FACILITY:  [facility]   PHYSICIAN:  Leonie Man, M.D.   DATE OF BIRTH:  1975/07/04   DATE OF PROCEDURE:  01/10/2006  DATE OF DISCHARGE:                                 OPERATIVE REPORT   PREOPERATIVE DIAGNOSIS:  Acute cholecystitis.   POSTOPERATIVE DIAGNOSIS:  Acute cholecystitis.   PROCEDURES:  Laparoscopic cholecystectomy, intraoperative cholangiogram.   SURGEON:  Dr. Leonie Man.   ASSISTANT:  Dr. Wilmon Arms. [place].   ANESTHESIA:  General.   INDICATIONS FOR PROCEDURE:  The patient is a 35 year old female with known  gallstones who became acutely ill within the past 24-48 hours with  increasing abdominal pain, nausea and vomiting.  She was evaluated in the  emergency room where she was on CT scan noted to have a picture consistent  with acute cholecystitis on CT scan. Liver function studies are all within  normal limits except for a mild change in the elevation of her bilirubin.  The patient is a morbidly obese female weighing 327 pounds.  She comes to  the operating room being fully apprised of the risks and potential benefits  of laparoscopic cholecystectomy.   DESCRIPTION OF PROCEDURE:  Following the induction of satisfactory general  anesthesia, the patient is positioned supinely.  The abdomen is prepped and  draped routinely.  Open laparoscopy created at the umbilicus with insertion  of a Hassan cannula and insufflation of the peritoneal cavity to 14 mmHg  pressure using carbon dioxide.  The camera was inserted. Exploration of the  abdomen shows the liver edges to be somewhat rounded, there was a fatty  liver, that was an acutely inflamed gallbladder with multiple adhesions to  the gallbladder wall.  No small or large intestine visualized appeared to be  abnormal.  Under direct vision, epigastric and flank  trocars were placed.  The gallbladder was aspirated off its contents because it was acutely  hydropic. Dissection was carried down in the region of the ampulla of the  gallbladder.  Because of the patient's obesity, we switched to the 30 degree  scope in order to better visualize the region of the ampulla. The cystic  artery and cystic duct was then isolated and after visualization the cystic  duct was clipped proximally and opened. A cystic duct cholangiogram carried  out by passing the [facility] catheter into the abdomen and into the cystic duct  through which 1/2 strength Hypaque was injected under fluoroscopic control.  The resulting cholangiogram was normal.  The cystic duct catheter was then  removed and the cystic duct is triply clipped and then transected.  The  cystic artery isolated, doubly clipped and transected.  The gallbladder was  dissected free from the liver bed using electrocautery maintaining  hemostasis throughout the course of the dissection.  This was a particularly  difficult dissection because of the acute inflammation of the gallbladder.  On removal of the gallbladder from the liver bed, the gallbladder was placed  in an Endopouch and  retrieved through the umbilical wound.  We needed to  open the umbilical wound somewhat more widely in order to extruded the very  thick-walled gallbladder with very large stones within it.  The right upper  quadrant was then thoroughly irrigated with normal. I placed a 19-French  Blake drain in the sub hepatic space for additional drainage. Sponge,  instrument and sharp counts were then verified and the pneumoperitoneum  allowed to deflate and the wounds closed following removal of the trocars  under direct vision.  The umbilical wound closed in two layers with #0  Vicryl and 4-0  Monocryl, epigastric and flank wounds closed with 4-0 Monocryl.  All wounds  reinforced with Steri-Strips.  Sterile dressings applied, anesthetic  reversed.   The patient removed from the operating room to the recovery room  in stable condition.  She tolerated the procedure well.      Leonie Man, M.D.  Electronically Signed     PB/MEDQ  D:  01/10/2006  T:  01/10/2006  Job:  [id number]

## 2010-10-16 ENCOUNTER — Encounter (HOSPITAL_BASED_OUTPATIENT_CLINIC_OR_DEPARTMENT_OTHER): Payer: Medicare Other | Admitting: Licensed Clinical Social Worker

## 2010-10-26 ENCOUNTER — Encounter (HOSPITAL_BASED_OUTPATIENT_CLINIC_OR_DEPARTMENT_OTHER): Payer: Medicare Other | Admitting: Licensed Clinical Social Worker

## 2010-10-26 DIAGNOSIS — F259 Schizoaffective disorder, unspecified: Secondary | ICD-10-CM

## 2010-10-31 ENCOUNTER — Encounter (HOSPITAL_COMMUNITY): Payer: Medicare Other | Admitting: Licensed Clinical Social Worker

## 2010-11-01 ENCOUNTER — Encounter (HOSPITAL_BASED_OUTPATIENT_CLINIC_OR_DEPARTMENT_OTHER): Payer: Medicare Other | Admitting: Licensed Clinical Social Worker

## 2010-11-07 ENCOUNTER — Encounter (HOSPITAL_BASED_OUTPATIENT_CLINIC_OR_DEPARTMENT_OTHER): Payer: Medicare Other | Admitting: Licensed Clinical Social Worker

## 2010-11-07 DIAGNOSIS — F259 Schizoaffective disorder, unspecified: Secondary | ICD-10-CM

## 2010-11-13 ENCOUNTER — Encounter (HOSPITAL_BASED_OUTPATIENT_CLINIC_OR_DEPARTMENT_OTHER): Payer: Medicare Other | Admitting: Licensed Clinical Social Worker

## 2010-11-23 ENCOUNTER — Encounter (HOSPITAL_COMMUNITY): Payer: Medicare Other | Admitting: Licensed Clinical Social Worker

## 2010-11-29 ENCOUNTER — Encounter (HOSPITAL_BASED_OUTPATIENT_CLINIC_OR_DEPARTMENT_OTHER): Payer: Medicare Other | Admitting: Licensed Clinical Social Worker

## 2010-12-06 ENCOUNTER — Encounter (HOSPITAL_BASED_OUTPATIENT_CLINIC_OR_DEPARTMENT_OTHER): Payer: Medicare Other | Admitting: Licensed Clinical Social Worker

## 2010-12-13 ENCOUNTER — Encounter (HOSPITAL_BASED_OUTPATIENT_CLINIC_OR_DEPARTMENT_OTHER): Payer: Medicare Other | Admitting: Licensed Clinical Social Worker

## 2010-12-20 ENCOUNTER — Encounter (HOSPITAL_BASED_OUTPATIENT_CLINIC_OR_DEPARTMENT_OTHER): Payer: Medicare Other | Admitting: Licensed Clinical Social Worker

## 2010-12-26 ENCOUNTER — Encounter (HOSPITAL_BASED_OUTPATIENT_CLINIC_OR_DEPARTMENT_OTHER): Payer: Medicare Other | Admitting: Licensed Clinical Social Worker

## 2010-12-26 DIAGNOSIS — F209 Schizophrenia, unspecified: Secondary | ICD-10-CM

## 2011-01-03 ENCOUNTER — Encounter (HOSPITAL_BASED_OUTPATIENT_CLINIC_OR_DEPARTMENT_OTHER): Payer: Medicare Other | Admitting: Licensed Clinical Social Worker

## 2011-01-09 ENCOUNTER — Encounter (HOSPITAL_COMMUNITY): Payer: Medicare Other | Admitting: Licensed Clinical Social Worker

## 2011-01-16 ENCOUNTER — Encounter (INDEPENDENT_AMBULATORY_CARE_PROVIDER_SITE_OTHER): Payer: Medicare Other | Admitting: Licensed Clinical Social Worker

## 2011-01-24 ENCOUNTER — Ambulatory Visit (HOSPITAL_COMMUNITY): Payer: Medicare Other | Admitting: Physician Assistant

## 2011-02-01 ENCOUNTER — Encounter (INDEPENDENT_AMBULATORY_CARE_PROVIDER_SITE_OTHER): Payer: Medicare Other | Admitting: Licensed Clinical Social Worker

## 2011-02-06 ENCOUNTER — Encounter (HOSPITAL_COMMUNITY): Payer: Medicare Other | Admitting: Licensed Clinical Social Worker

## 2011-02-27 ENCOUNTER — Encounter (INDEPENDENT_AMBULATORY_CARE_PROVIDER_SITE_OTHER): Payer: Medicare Other | Admitting: Licensed Clinical Social Worker

## 2011-03-13 ENCOUNTER — Encounter (HOSPITAL_COMMUNITY): Payer: Medicare Other | Admitting: Licensed Clinical Social Worker

## 2011-03-18 ENCOUNTER — Encounter (INDEPENDENT_AMBULATORY_CARE_PROVIDER_SITE_OTHER): Payer: Medicare Other | Admitting: Licensed Clinical Social Worker

## 2011-03-25 ENCOUNTER — Encounter (INDEPENDENT_AMBULATORY_CARE_PROVIDER_SITE_OTHER): Payer: Medicare Other | Admitting: Licensed Clinical Social Worker

## 2011-04-02 ENCOUNTER — Encounter (HOSPITAL_COMMUNITY): Payer: Self-pay | Admitting: Licensed Clinical Social Worker

## 2011-04-02 ENCOUNTER — Ambulatory Visit (INDEPENDENT_AMBULATORY_CARE_PROVIDER_SITE_OTHER): Payer: Medicare Other | Admitting: Licensed Clinical Social Worker

## 2011-04-02 DIAGNOSIS — F259 Schizoaffective disorder, unspecified: Secondary | ICD-10-CM

## 2011-04-02 NOTE — Progress Notes (Signed)
   THERAPIST PROGRESS NOTE  Session Time: 3pm-3:50pm  Participation Level: Active  Behavioral Response: Well GroomedAlertAnxious and Irritable  Type of Therapy: Individual Therapy  Treatment Goals addressed: Anxiety, Coping and Diagnosis: schizoaffective disorder  Interventions: Motivational Interviewing, Solution Focused, Strength-based and Supportive  Summary: Michaela Norris is a 35 y.o. female who presents with anxious mood and affect. She reports anxiety and frustration related to school and discusses her disappointment with her fellow class mates for their misunderstanding of her and their unwillingness to understand her further. She is animated and irritable when discussing the focus on "white privilege" in her class. Patient does not identify herself as white and does not identify herself as a gender. Explored her identity formation struggle and how this creates tension in her relationships because others assume things about her which she states are not true, such as having privilege because she is white. Patient has strong feelings about her social class and is not ashamed to identify herself as poor. This part of her identity is well formed. Processed her stress related to finishing out the semester, her projects which are due and the upcoming holidays. She plans to spend the holidays with her family and will spend a few weeks at their house. Explored patients concern that she will loose herself while staying with her family. Discussed warning signs that tell her she is in trouble, such as a lack of exercise, eating poorly and decreased motivation. recommend patient continue her excellent self care over break. She reports baseline AH and paranoia. Her sleep has greatly improved due to exercise and she sleeps seven to eight hours per night. Her appetite is WNR. Reviewed goals and coping strategies for patient to use while this therapist is on leave.    Suicidal/Homicidal: Nowithout  intent/plan  Therapist Response: Assisted patent with the expression of her feelings of frustration and anger. Explored her identity disturbance and how this impacts her daily interaction with people. She has become more defensive and expresses her surprise over this. Reframed patients experience as learning opportunities and personal growth. Patient is English as a second language teacher and passions which she did not know she had, such as working in [employer], [employer]. She is managing her daughter well and demonstrates increased faith in her daughters therapist. Patient is more flexible and willing to consider alternative options for helping her daughter. Used CBT to help patient realign her negative self talk about her capacity and encouraged her to verbalize more factual responses.   Plan: Return again in 8 weeks.  Diagnosis: Axis I: Schizoaffective Disorder    Axis II: No diagnosis    Oseias Horsey, LCSW 04/02/2011

## 2011-06-05 DIAGNOSIS — R5383 Other fatigue: Secondary | ICD-10-CM | POA: Diagnosis not present

## 2011-06-05 DIAGNOSIS — R109 Unspecified abdominal pain: Secondary | ICD-10-CM | POA: Diagnosis not present

## 2011-06-05 DIAGNOSIS — R5381 Other malaise: Secondary | ICD-10-CM | POA: Diagnosis not present

## 2011-06-05 DIAGNOSIS — D649 Anemia, unspecified: Secondary | ICD-10-CM | POA: Diagnosis not present

## 2011-06-11 ENCOUNTER — Encounter (HOSPITAL_COMMUNITY): Payer: Self-pay | Admitting: Licensed Clinical Social Worker

## 2011-06-11 ENCOUNTER — Ambulatory Visit (INDEPENDENT_AMBULATORY_CARE_PROVIDER_SITE_OTHER): Payer: Medicare Other | Admitting: Licensed Clinical Social Worker

## 2011-06-11 DIAGNOSIS — F319 Bipolar disorder, unspecified: Secondary | ICD-10-CM | POA: Diagnosis not present

## 2011-06-11 DIAGNOSIS — F4310 Post-traumatic stress disorder, unspecified: Secondary | ICD-10-CM

## 2011-06-11 NOTE — Progress Notes (Signed)
   THERAPIST PROGRESS NOTE  Session Time: 2:00pm-2:50pm  Participation Level: Active  Behavioral Response: Well GroomedAlertAnxious  Type of Therapy: Individual Therapy  Treatment Goals addressed: Anxiety and Diagnosis: bi-polar disorder, PTSD  Interventions: Solution Focused, Strength-based and Supportive  Summary: Michaela Norris is a 36 y.o. female who presents with euthymic mood and bright affect. This is the first session in two months since this therapist has been out on leave. Patient reports doing well over the holidays because she stayed with her parents where she feels safer. She immediately begins talking about her decision to no longer identify with her diagnosis of bi-polar disorder or schizoaffective disorder, "which ever one I have". She boldly discusses her belief that these diagnosis are wrong and that instead she has PTSD and is on the Autism Spectrum scale. She has come to this conclusion after years of questioning her diagnosis and reports that her daughters recent diagnosis of Aspergers has helped her come to this conclusion. She admits that many times in therapy she agrees and demonstrates that she understands when she doesn't. She discusses that she processes information much slower than she has let on and while she may understand something in session, she forgets once she leaves. She wants to focus more on her PTSD symptoms, which patient does demonstrate and report, such as hyper vigilance, increased anxiety and avoidance of places similar to trauma, high startle response, psychosis and dissociation. She reports poor mood regulation occurring every day. Her appetite is wnl and her sleep is poor, due to feeling unsafe. She has had increased hypochondriac thoughts and believes that something is physically wrong with her. She went to the doctor and was told she was healthy. She has racing thoughts at night focused on her health. She continues to experience high levels of stress  related to her daughter. She has started the second semester of graduate school and is happy and eager for what lies ahead.    Suicidal/Homicidal: Nowithout intent/plan  Therapist Response: Patient is upbeat and excited today. She is bold and strong in her communication about her diagnosis and demonstrates fear that this therapist would dismiss her ideas. Will explore PTSD further in treatment. Will focus on developing distress tolerance skills, emotional regulation, self soothing techniques and anxiety reduction. There is no evidence of psychosis today and patient denies any AH, VH or delusions. Processed patients beliefs about her diagnosis. Explored her understanding of PTSD and how it manifests in her. She has decided to stop taking all psychiatric medications and has discussed this with her psychiatrist, Dr. Piedad Cl[facility]. Discussed the importance of patient directing the speed of therapy and she agrees to inform this therapist if she needs to slow down. Plan to have patient write down information in session to assist with processing. Overall, she is doing well, except not sleeping. Discussed sleep hygiene. Reviewed patients self care plan. Assessed her progress related to self care. Patient's self care is good. Recommend proper diet, regular exercise, socialization and recreation. Patient denies suicidal and homicidal ideation, intent or plan.  Plan: Return again in one weeks.  Diagnosis: Axis I: Bipolar, mixed and Post Traumatic Stress Disorder    Axis II: Deferred    Takela Varden, LCSW 06/11/2011

## 2011-06-25 ENCOUNTER — Ambulatory Visit (INDEPENDENT_AMBULATORY_CARE_PROVIDER_SITE_OTHER): Payer: Medicare Other | Admitting: Licensed Clinical Social Worker

## 2011-06-25 ENCOUNTER — Encounter (HOSPITAL_COMMUNITY): Payer: Self-pay | Admitting: Licensed Clinical Social Worker

## 2011-06-25 NOTE — Progress Notes (Signed)
   THERAPIST PROGRESS NOTE  Session Time: 11:30am-12:20pm  Participation Level: Active  Behavioral Response: Well GroomedAlertAnxious  Type of Therapy: Individual Therapy  Treatment Goals addressed: Coping  Interventions: CBT, Motivational Interviewing, Solution Focused and Strength-based  Summary: Michaela Norris is a 36 y.o. female who presents with anxious mood and affect. She reports anxiety and anger related to her daughters immaturity and refusal to participate in life. Patient endorses feelings of hopelessness and helplessness about her daughters behavior. She tries to hold her accountable by taking things away from her, but this doesn't work. She is upset that her daughter "doesn't care" about anything. She is disappointed with her graduate program and is uncomfortable listening to classmates become emotional regarding gender issues. Because of this she does not want to pursue gender studies after she earns her Master's Degree. Her sleep is disrupted by panic attacks and her appetite is wnl.    Suicidal/Homicidal: Nowithout intent/plan  Therapist Response: Patient demonstrates difficulty tolerating exposure to intense emotionality. As a result, she withdraws. Explored this further with patient and processed her inability to connect to intense emotions, because she doesn't feel them. Explored her desire to build distress tolerance skills in this area. She is willing, but is not confident she learn. Processed her feelings about her daughter. Explored public school as a option. She is hesitant, negates the positive and has strong negative distorted thoughts about this option. Used motivational interviewing to assist and encourage patient through the change process. Explored patients barriers to change. Used CBT to assist patient with the identification of her negative distortions and irrational thoughts. Encouraged patient to verbalize alternative and factual responses which challenge her  distortions. Reviewed patients self care plan. Assessed her progress related to self care. Patient's self care is good. Recommend proper diet, regular exercise, socialization and recreation.   Plan: Return again in one weeks.  Diagnosis: Axis I: Schizoaffective Disorder    Axis II: Deferred    Ankur Snowdon, LCSW 06/25/2011

## 2011-07-01 ENCOUNTER — Ambulatory Visit (HOSPITAL_COMMUNITY): Payer: Medicare Other | Admitting: Licensed Clinical Social Worker

## 2011-07-08 ENCOUNTER — Ambulatory Visit (INDEPENDENT_AMBULATORY_CARE_PROVIDER_SITE_OTHER): Payer: Medicare Other | Admitting: Licensed Clinical Social Worker

## 2011-07-08 ENCOUNTER — Encounter (HOSPITAL_COMMUNITY): Payer: Self-pay | Admitting: Licensed Clinical Social Worker

## 2011-07-08 NOTE — Progress Notes (Signed)
   THERAPIST PROGRESS NOTE  Session Time: 11:30am-12:20pm  Participation Level: Active  Behavioral Response: Well GroomedAlertAnxious and Depressed  Type of Therapy: Individual Therapy  Treatment Goals addressed: Anxiety and Coping  Interventions: CBT, Solution Focused and Supportive  Summary: Michaela Norris is a 36 y.o. female who presents with depressed/anxious mood and constricted affect. She reports increased stress related to things outside of herself, such as her family, her daughter and graduate school. She is happy with who she is, which is a new experince for her. She has realized that she has not paid attention to who she is or what she enjoys her entire life and now that she is paying attention, she feels less pressure to be what "everyone else wants me to be". She wants to move to [place] and is unhappy with graduate school, which she is not interested in anymore. She discusses her plan to relocate and why she needs to do this. She wants to distance herself from her family, who's influence and dysfunction have become clearer to her. Her sleep is poor and her appetite is wnl. She continues to experience baseline AH. VH and paranoia are absent.    Suicidal/Homicidal: Nowithout intent/plan  Therapist Response: Patient is struggling to manage her stress related to her daughter, family and school. She remains frustrated with her daughter's disrespect and refusal to cooperate. Processed w/pt her hurt feelings and her occasional fear when her daughter threatens to hurt her. Discussed her safety, which she is confident about defending herself against her daughter. Educated patient on hospitalization for her daughter and contacting 911 if she becomes violent. Explored strategies to increase her tolerance for school related stress and ways to delay an impulsive move to [place]. Reviewed patients self care plan. Assessed her progress related to self care. Patient's self care is good. Recommend  proper diet, regular exercise, socialization and recreation.   Plan: Return again in one weeks.  Diagnosis: Axis I: Schizoaffective Disorder    Axis II: No diagnosis    Thai Hemrick, LCSW 07/08/2011

## 2011-07-12 DIAGNOSIS — R799 Abnormal finding of blood chemistry, unspecified: Secondary | ICD-10-CM | POA: Diagnosis not present

## 2011-07-12 DIAGNOSIS — D509 Iron deficiency anemia, unspecified: Secondary | ICD-10-CM | POA: Diagnosis not present

## 2011-07-15 ENCOUNTER — Ambulatory Visit (INDEPENDENT_AMBULATORY_CARE_PROVIDER_SITE_OTHER): Payer: Medicare Other | Admitting: Licensed Clinical Social Worker

## 2011-07-15 DIAGNOSIS — F314 Bipolar disorder, current episode depressed, severe, without psychotic features: Secondary | ICD-10-CM

## 2011-07-15 NOTE — Progress Notes (Signed)
   THERAPIST PROGRESS NOTE  Session Time: 2:00pm-2:50pm  Participation Level: Active  Behavioral Response: Well GroomedAlertAnxious and Depressed  Type of Therapy: Individual Therapy  Treatment Goals addressed: Anxiety, Coping and Diagnosis: bi-polar disorder  Interventions: CBT, Motivational Interviewing, Strength-based, Supportive and Other: reality testing  Summary: Michaela Norris is a 36 y.o. female who presents with depressed mood and anxious affect. She is having a difficult time managing her stress related to graduate school, her daughter and living on her own. She is tearful when describing feelings of being overwhelmed and unable to continue functioning like this. She endorses baseline paranoia, AH which have become more docile and non threatening and an absence of VH and delusions. She is angry and disappointed with her graduate school experience and feels that she tries to make it hard, when it is not. She is angry and confused by her daughters behavior. She is not interested in getting her daughter help after their experience at [facility] and believes that it would be of no help because her daughter is not motivated for change. She believes she is unable to live alone because it is too stressful. She wants one of her major stressors to provide relief, but she sees not alternative to her current circumstances. She is focused on feeling bad physically and has developed some hypochondriasis. Her sleep and appetite are both inconsistent. She understands that medication would help her, but she does not want to take it.   Suicidal/Homicidal: Nowithout intent/plan  Therapist Response: Patient is depressed and anxious today. She is overwhelmed and having trouble problem solving. She is tearful in session, which is atypical for patient. Processed w/pt her fears, disappointments and anger. Assisted her with problem solving around her stressors. Discussed ways in which she can disengage from her  stressors, since she is unwilling and unable to make significant changes in her stressors. Provided reality testing for patient, who is oriented, without any indication of psychosis. Discussed self care and a plan for her to manage her stress. encouraged patient to seek help her for daughter. Explored ways in which patient adjust her expectations. Used CBT to assist patient with the identification of her negative distortions and irrational thoughts. Encouraged patient to verbalize alternative and factual responses which challenge her distortions. Reviewed patients self care plan. Assessed her progress related to self care. Patient's self care is fair. Recommend proper diet, regular exercise, socialization and recreation.   Plan: Return again in one weeks.  Diagnosis: Axis I: Bipolar, Depressed    Axis II: No diagnosis    Tracie Dore, LCSW 07/15/2011

## 2011-07-22 ENCOUNTER — Ambulatory Visit (INDEPENDENT_AMBULATORY_CARE_PROVIDER_SITE_OTHER): Payer: Medicare Other | Admitting: Licensed Clinical Social Worker

## 2011-07-22 DIAGNOSIS — F315 Bipolar disorder, current episode depressed, severe, with psychotic features: Secondary | ICD-10-CM

## 2011-07-22 DIAGNOSIS — D649 Anemia, unspecified: Secondary | ICD-10-CM | POA: Diagnosis not present

## 2011-07-22 NOTE — Progress Notes (Signed)
   THERAPIST PROGRESS NOTE  Session Time: 2:00pm-2:50pm  Participation Level: Active  Behavioral Response: Well GroomedAlertAnxious and Depressed  Type of Therapy: Individual Therapy  Treatment Goals addressed: Anxiety and Coping  Interventions: CBT, DBT and Supportive  Summary: Michaela Norris is a 36 y.o. female who presents with depressed mood and anxious affect. She starts the session by asking how she can have her diagnosis officially changed to Pervasive Developmental Disorder and PTSD.  She does not believe she has mood disorder and feels that because of her bipolar diagnosis, she is treated poorly and prejudicially by doctors. She hopes that if her diagnosis changes then her doctors will treat her better and realize they need to slow down when explaining things to her. She is tearful when describing her struggle in life to pretend that she knows what is going on. She is strongly against the idea that she has a mood disorder, despite symptoms clearly supporting this. Her stress has increased this semester, leaving her with little down time to recover. As a result her delusions about her body are stronger and her AH and paranoia are at baseline. Her sleep and appetite are both inconsistent.   Suicidal/Homicidal: Nowithout intent/plan  Therapist Response: Processed w/pt her feelings of frustration regarding her diagnosis. Provided feedback to patient explaining that PDD and PTSD do not account for her psychosis, which she was not aware of. Explored her history of psychosis dating back to childhood and how trauma and neglect have impacted her psychosis. Assisted patient with increased insight her distortions about her diagnosis. Challenged patients belief that a change in her diagnosis will change how doctors treat her. Used CBT to assist patient with the identification of her negative distortions and irrational thoughts. Encouraged patient to verbalize alternative and factual responses which  challenge her distortions. Reviewed patients self care plan. Assessed her progress related to self care. Patient's self care is fair. Recommend proper diet, regular exercise, socialization and recreation.   Plan: Return again in one weeks.  Diagnosis: Axis I: Bipolar, Depressed    Axis II: No diagnosis    Tyleigh Mahn, LCSW 07/22/2011

## 2011-07-29 ENCOUNTER — Ambulatory Visit (INDEPENDENT_AMBULATORY_CARE_PROVIDER_SITE_OTHER): Payer: Medicare Other | Admitting: Licensed Clinical Social Worker

## 2011-07-29 DIAGNOSIS — F3130 Bipolar disorder, current episode depressed, mild or moderate severity, unspecified: Secondary | ICD-10-CM | POA: Diagnosis not present

## 2011-07-29 DIAGNOSIS — F319 Bipolar disorder, unspecified: Secondary | ICD-10-CM
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
## 2011-08-05 ENCOUNTER — Ambulatory Visit (HOSPITAL_COMMUNITY): Payer: Medicare Other | Admitting: Licensed Clinical Social Worker

## 2011-08-15 ENCOUNTER — Ambulatory Visit (INDEPENDENT_AMBULATORY_CARE_PROVIDER_SITE_OTHER): Payer: Medicare Other | Admitting: Licensed Clinical Social Worker

## 2011-08-15 ENCOUNTER — Encounter (HOSPITAL_COMMUNITY): Payer: Self-pay | Admitting: Licensed Clinical Social Worker

## 2011-08-15 DIAGNOSIS — F3130 Bipolar disorder, current episode depressed, mild or moderate severity, unspecified: Secondary | ICD-10-CM

## 2011-08-15 DIAGNOSIS — F319 Bipolar disorder, unspecified: Secondary | ICD-10-CM | POA: Insufficient documentation

## 2011-08-15 NOTE — Progress Notes (Signed)
   THERAPIST PROGRESS NOTE  Session Time: 3:00pm-3:50pm  Participation Level: Active  Behavioral Response: Well GroomedAlertAnxious and Irritable  Type of Therapy: Individual Therapy  Treatment Goals addressed: Anxiety and Coping  Interventions: CBT, Solution Focused, Strength-based and Supportive  Summary: Michaela Norris is a 36 y.o. female who presents with anxious mood and irritable affect. She has just returned from her trip to [place] and states that she does not want to be back here. She does not want to be in school, is disgusted with her daughters behavior, and wants to move to [place] immediately. She plans to finish her graduate degree, but has lost interest and is disappointed that she did not fit in as she had hoped she would. She discusses her attempts to continually find a place in this world where she will fit in, and how she has been unable to find this. She talks with sweeping comments about academia and how she does not belong there because they are a "bunch of rich, elitist people". She is at her wits end with her daughter and describes her daughter melting down at [employer] because she lost her stuffed animals. She is concerned about her daughters PICA and does not know how to help her. She is open to getting her help, but her attempts so far have led to her feeling frustrated because she does not feel heard or believed by the therapists. Patient denies any paranoia, VH or AH. She does not demonstrate and denies any delusions. Her sleep and appetite are wnl.   Suicidal/Homicidal: Nowithout intent/plan  Therapist Response: Processed w/pt her feelings of frustration with her life circumstances. Her thinking is rigid, black and white today and she uses broad overgeneralizations to justify her reasons for wanting to leave her idea of working in academia. Used CBT to assist patient with the identification of her negative distortions and irrational thoughts. Encouraged patient to  verbalize alternative and factual responses which challenge her distortions. Will talk to a colleague about seeing patients daughter and will look for community assistance and support for her. Explored and problem solved about her disinterest in school. Challenged her search to belong and her misplaced hope in things which did not deliver. Reviewed patients self care plan. Assessed her progress related to self care. Patient's self care is fair. Recommend proper diet, regular exercise, socialization and recreation.   Plan: Return again in one weeks.  Diagnosis: Axis I: Bipolar, Depressed    Axis II: No diagnosis    Shawnn Bouillon, LCSW 08/15/2011

## 2011-08-20 ENCOUNTER — Encounter (HOSPITAL_COMMUNITY): Payer: Self-pay | Admitting: Licensed Clinical Social Worker

## 2011-08-20 ENCOUNTER — Ambulatory Visit (INDEPENDENT_AMBULATORY_CARE_PROVIDER_SITE_OTHER): Payer: Medicare Other | Admitting: Licensed Clinical Social Worker

## 2011-08-20 DIAGNOSIS — F3130 Bipolar disorder, current episode depressed, mild or moderate severity, unspecified: Secondary | ICD-10-CM | POA: Diagnosis not present

## 2011-08-20 NOTE — Progress Notes (Signed)
   THERAPIST PROGRESS NOTE  Session Time: 1:00pm-1:50pm  Participation Level: Active  Behavioral Response: Well GroomedAlertAnxious and Depressed  Type of Therapy: Individual Therapy  Treatment Goals addressed: Coping  Interventions: CBT, Solution Focused, Strength-based and Supportive  Summary: Michaela Norris is a 36 y.o. female who presents with anxious mood and affect. She feels overwhelmed with disorganized thinking and a lack of attention. She is tearful when talking about her inability to focus, complete basic tasks and attend school. She wants to finish out the semester, but may not be able to. 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 She compares going to school to feeling terrorized. She is upset that she and her peers are treated poorly and not communicated to. Her sleep and appetite are both disrupted. She feels unable to deal with her daughters problems and admits that they are much worse than she has ever acknowledged. She denies feeling paranoid or hearing voices. She denies any desire to harm herself. She wants to shut off the world so she can "reset" herself.   Suicidal/Homicidal: Nowithout intent/plan  Therapist Response: Processed patients deteriorating capacity to tolerate stress. Discussed a realistic approach to deal with school at this time. Recommend patient consider taking a medical leave of absence and inquire about this asap. Recommend she contact this therapist once she decides what to do. Provided feedback about the importance of not making big life changing decisions such as dropping out of school while she feels this poor. She is unwilling to take any medication to alleviate her symptoms. Discussed strategies to reduce stress. Reviewed patients self care plan. Assessed her progress related to self care. Patient's self care is fair. Recommend proper diet, regular  exercise, socialization and recreation.   Plan: Return again in one weeks.  Diagnosis: Axis I: Bipolar, Depressed    Axis II: No diagnosis    Ardit Danh, LCSW 08/20/2011

## 2011-09-02 ENCOUNTER — Ambulatory Visit (INDEPENDENT_AMBULATORY_CARE_PROVIDER_SITE_OTHER): Payer: Medicare Other | Admitting: Licensed Clinical Social Worker

## 2011-09-02 ENCOUNTER — Encounter: Payer: Medicare Other | Attending: Family Medicine | Admitting: *Deleted

## 2011-09-02 ENCOUNTER — Encounter: Payer: Self-pay | Admitting: *Deleted

## 2011-09-02 DIAGNOSIS — F319 Bipolar disorder, unspecified: Secondary | ICD-10-CM

## 2011-09-02 DIAGNOSIS — F3130 Bipolar disorder, current episode depressed, mild or moderate severity, unspecified: Secondary | ICD-10-CM

## 2011-09-02 DIAGNOSIS — E669 Obesity, unspecified: Secondary | ICD-10-CM | POA: Insufficient documentation

## 2011-09-02 NOTE — Progress Notes (Signed)
Medical Nutrition Therapy:  Appt start time: 1630 end time:  1730.  Assessment:  Primary concerns today: Patient here for obesity with fatigue and anemia. She declined being weighed today, states her last weight at MD office was 343 pounds. 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  MEDICATIONS: see list   DIETARY INTAKE: Usual eating pattern includes 3 meals and 3 snacks per day.  Everyday foods include food variety of all food groups.  Avoided foods include meat.    24-hr recall:  B ( AM): lately - started eating now; unsweetened cereal with granola on top, and skim milk   Snk ( AM): fruit and water, occasionally power bar  L ( PM): home: meal sized salad with dark lettuce, tomato, broccoli, kidney beans, veg - bacon bits, egg, [place] bread, rosemary olive oil Snk ( PM): almonds, pretzels if any D ( PM): eat out often: chinese order in OR [place], at home: starch, vegetable, beans,  Snk ( PM): cookies OR cake OR pie from store Beverages: skim milk, water, unsweet tea, diet soda if out  Usual physical activity: walks to classes  Estimated energy needs: 1600 calories 180 g carbohydrates 120 g protein 44 g fat  Progress Towards Goal(s):  In progress.   Nutritional Diagnosis:  NI-1.5 Excessive energy intake As related to activity level.  As evidenced by morbid obesity.    Intervention:  Nutrition counseling provided. Recommend more attention to carb intake, reading food labels and increasing activity level as tolerated to increase metabolism Plan: Consider using cast iron skillet to assist with improving anemia Keep track of Carb Choices to asses what current carb intake habits are Increase activity level through Arm Chair Exercises or Water Aerobics as tolerated Look for lower calorie salad dressings  Handouts given during visit include:  Carb Counting and reading Food Labels  Meal Plan  Card  Recipe for low calorie Ranch dressing to be emailed to patient.  Monitoring/Evaluation:  Dietary intake, exercise, read food labels, and body weight in 4 week(s).

## 2011-09-02 NOTE — Patient Instructions (Addendum)
Plan: Consider using cast iron skillet to assist with improving anemia Keep track of Carb Choices to asses what current carb intake habits are Increase activity level through Arm Chair Exercises or Water Aerobics as tolerated Look for lower calorie salad dressings

## 2011-09-02 NOTE — Progress Notes (Signed)
   THERAPIST PROGRESS NOTE  Session Time: 11:30am-12:20pm  Participation Level: Active  Behavioral Response: Well GroomedAlertAnxious  Type of Therapy: Individual Therapy  Treatment Goals addressed: Coping  Interventions: CBT, Motivational Interviewing, Strength-based, Supportive and Reframing  Summary: Michaela Norris is a 36 y.o. female who presents with euthymic mood and affect. She reports improvement in her anxiety, depression and frustration since her last session. She spoke with her professors and feels much better. She plans to remain in graduate school and feels hopeful after learning that her professors have nominated her for a scholarship. She expresses concern that her perception of events are much different than the reality of them. She is able to talk about how she misinterprets behavior, intention and words. She remains frustrated with her daughter. Her sleep and appetite are wnl.   Suicidal/Homicidal: Nowithout intent/plan  Therapist Response: Reviewed patients progress and assessed her current functioning. Explored ways in which she can improve her understanding of others intentions. Assisted her with increased insight into her cognitive distortions. Used CBT to assist patient with the identification of her negative distortions and irrational thoughts. Encouraged patient to verbalize alternative and factual responses which challenge her distortions. She completed a genogram on her own and discuses this. She will bring in next session. She demonstrates improved insight into the intergenerational patterns. Used motivational interviewing to assist and encourage patient through the change process. Explored patients barriers to change. Reviewed patients self care plan. Assessed her progress related to self care. Patient's self care is fair. Recommend proper diet, regular exercise, socialization and recreation.   Plan: Return again in one weeks.  Diagnosis: Axis I: Bipolar,  Depressed    Axis II: No diagnosis    Naveed Humphres, LCSW 09/02/2011

## 2011-09-11 ENCOUNTER — Ambulatory Visit (INDEPENDENT_AMBULATORY_CARE_PROVIDER_SITE_OTHER): Payer: Medicare Other | Admitting: Licensed Clinical Social Worker

## 2011-09-11 DIAGNOSIS — F3130 Bipolar disorder, current episode depressed, mild or moderate severity, unspecified: Secondary | ICD-10-CM

## 2011-09-11 DIAGNOSIS — F319 Bipolar disorder, unspecified: Secondary | ICD-10-CM

## 2011-09-11 NOTE — Progress Notes (Signed)
   THERAPIST PROGRESS NOTE  Session Time: 1:00pm-1:50pm  Participation Level: Active  Behavioral Response: Well GroomedAlertAnxious  Type of Therapy: Individual Therapy  Treatment Goals addressed: Coping  Interventions: CBT, Solution Focused, Supportive and Reframing  Summary: Michaela Norris is a 37 y.o. female who presents with anxious mood and affect. She has brought in the genogram she did on her family and walks this therapist through it. She discusses what she has learned about intergenerational patterns through this exercise. She is angry because no one in her family sought to help or protect her when she was a child. She questions why they didn't act upon obvious problems she had and she is angry that instead, she was punished for misunderstood behavior. She refers to herself as autistic today and states that this feels "right" to her and that looking at herself through this lense has helped her gain insight into why she has felt different all her life. She is anxious about dying, thinks about this daily and is concerned about dying suddenly and leaving her daughter with no one to care for her and advocate for her needs. She does not want her daughter to be "absobed" into her family.   Suicidal/Homicidal: Nowithout intent/plan  Therapist Response: Reviewed patients progress and assessed her current functioning. Processed w/pt her anger at her family for not protecting her and not acting upon her physical and emotional needs. She struggles to understand how her family could do this to her. Processed her grief, normalized her anger and explored ways in which she can express her anger and frustration. Patient does not want to confront her family on these issues. She is fearful of being invalidated. Reviewed strategies to manage her mood stability and anxiety. Used CBT to assist patient with the identification of her negative distortions and irrational thoughts. Encouraged patient to  verbalize alternative and factual responses which challenge her distortions. Reviewed patients self care plan. Assessed her progress related to self care. Patient's self care is fair. Recommend proper diet, regular exercise, socialization and recreation.   Plan: Return again in one weeks.  Diagnosis: Axis I: bipolar disorder    Axis II: No diagnosis    Caleah Tortorelli, LCSW 09/11/2011

## 2011-09-19 ENCOUNTER — Ambulatory Visit (INDEPENDENT_AMBULATORY_CARE_PROVIDER_SITE_OTHER): Payer: Medicare Other | Admitting: Licensed Clinical Social Worker

## 2011-09-19 DIAGNOSIS — F3130 Bipolar disorder, current episode depressed, mild or moderate severity, unspecified: Secondary | ICD-10-CM | POA: Diagnosis not present

## 2011-09-19 DIAGNOSIS — F319 Bipolar disorder, unspecified: Secondary | ICD-10-CM

## 2011-09-19 NOTE — Progress Notes (Signed)
   THERAPIST PROGRESS NOTE  Session Time: 3:00pm-3:50pm  Participation Level: Active  Behavioral Response: Well GroomedAlertAnxious  Type of Therapy: Individual Therapy  Treatment Goals addressed: Coping  Interventions: CBT, Supportive and Reframing  Summary: Michaela Norris is a 36 y.o. female who presents with anxious mood and affect. She states that the last 24 hours have been the most stressful in her entire life. 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 She reports that she didn't realize how much she relies upon avoidance to manage her stress is social situations until she has to be present last night for six hours. She is disappointed in this realization and how this impacts her future. She is upset that she has to take three years to finish her graduate program because they will not let her finish in two years. She feels indifferent about the program. She is overwhelmed by her daughters refusal to participate in life. She endorses baseline AH which is manageable. Her sleep is improving and her appetite is wnl.   Suicidal/Homicidal: Nowithout intent/plan  Therapist Response: Processed patients progress and assessed her current functioning. Processed w/pt her coping strategies to manage social situations and excessive stimuli. Explored her fears related to this and how she can manage in the world if she chooses to work after school. She demonstrates some negative self talk about this. Used CBT to assist patient with the identification of her negative distortions and irrational thoughts. Encouraged patient to verbalize alternative and factual responses which challenge her distortions. Used motivational interviewing to assist and encourage patient through the change process. Explored patients barriers to change. Reviewed patients self care plan. Assessed her progress related to self care. Patient's self care is fair. Recommend proper  diet, regular exercise, socialization and recreation.   Plan: Return again in one weeks.  Diagnosis: Axis I: bi polar disorder    Axis II: No diagnosis    Carsen Leaf, LCSW 09/19/2011

## 2011-10-08 ENCOUNTER — Ambulatory Visit (INDEPENDENT_AMBULATORY_CARE_PROVIDER_SITE_OTHER): Payer: Medicare Other | Admitting: Licensed Clinical Social Worker

## 2011-10-08 ENCOUNTER — Encounter (HOSPITAL_COMMUNITY): Payer: Self-pay | Admitting: Licensed Clinical Social Worker

## 2011-10-08 DIAGNOSIS — F319 Bipolar disorder, unspecified: Secondary | ICD-10-CM

## 2011-10-08 DIAGNOSIS — F3130 Bipolar disorder, current episode depressed, mild or moderate severity, unspecified: Secondary | ICD-10-CM | POA: Diagnosis not present

## 2011-10-08 NOTE — Progress Notes (Signed)
   THERAPIST PROGRESS NOTE  Session Time: 3:00pm-3:50pm  Participation Level: Active  Behavioral Response: Well GroomedAlertAnxious  Type of Therapy: Individual Therapy  Treatment Goals addressed: Coping  Interventions: CBT, Motivational Interviewing, Solution Focused and Supportive  Summary: Susie Ehresman is a 36 y.o. female who presents with anxious mood and constricted affect. She has finished her first year of graduate school and is awaiting her grades, which she is anxious about and uncertain how she did. She feels overwhelmed and has been isolating herself more as a means for protection. She has been spending her time cooking, learning new things to cook, reading for pleasure and feeding the birds. She is focused on self care in a new way and is pleased about this. She remains upset with her daughters behavior and has been trying to set firmer boundaries with her by refusing to engage in conversations which upset her. She denies any AH, VH or paranoia. Her sleep is improving and her appetite is wnl.    Suicidal/Homicidal: Nowithout intent/plan  Therapist Response: Reviewed patients progress and assessed her current functioing. Processed w/pt her accomplishments related to graduate school and explored lessons learned about herself and her capacity to tolerate social situations. Her self care is improving and she is learning to accept her limitations with greater ease. Discussed her plans for the summer, ways to manage her mood lability, remain focused and healthy. Used motivational interviewing to assist and encourage patient through the change process. Explored patients barriers to change. Reviewed patients self care plan. Assessed her progress related to self care. Patient's self care is her. Recommend proper diet, regular exercise, socialization and recreation.   Plan: Return again in one weeks.  Diagnosis: Axis I: Bipolar, Depressed    Axis II: No diagnosis    Ivaan Liddy,  LCSW 10/08/2011

## 2011-10-15 ENCOUNTER — Ambulatory Visit (INDEPENDENT_AMBULATORY_CARE_PROVIDER_SITE_OTHER): Payer: Medicare Other | Admitting: Licensed Clinical Social Worker

## 2011-10-15 ENCOUNTER — Encounter (HOSPITAL_COMMUNITY): Payer: Self-pay | Admitting: Licensed Clinical Social Worker

## 2011-10-15 DIAGNOSIS — F319 Bipolar disorder, unspecified: Secondary | ICD-10-CM

## 2011-10-15 DIAGNOSIS — F3130 Bipolar disorder, current episode depressed, mild or moderate severity, unspecified: Secondary | ICD-10-CM

## 2011-10-15 NOTE — Progress Notes (Signed)
   THERAPIST PROGRESS NOTE  Session Time: 1:00pm-1:50pm  Participation Level: Active  Behavioral Response: Well GroomedAlertAnxious and Euthymic  Type of Therapy: Individual Therapy  Treatment Goals addressed: Anxiety and Coping  Interventions: CBT, Motivational Interviewing and Reframing  Summary: Michaela Norris is a 36 y.o. female who presents with euthymic mood and anxious affect. She has officially finished her first year of graduate school and is pleased with her grades. She has been relaxing and "doing nothing". She continues to spend time cooking, learning new recipes and focusing on her health. She endorses daily anxiety about her health because she feels "bad every day" and is angry that her doctor continues to write this off due to her anemia. She has found a new doctor and will follow up in June. She is focused on back pain, exhaustion and pressure behind her eyes. She does not feel she is taken seriously by doctors due to her size and feels discriminated against by them. She attempts to use distractions to manage her irrational thinking regarding her body sensations, but endorses that this is difficult. She questions why she does not loose more weight despite changes in her diet and some exercise. She endorses poor motivation to exercise over the summer. Her sleep is improving and her appetite is wnl.    Suicidal/Homicidal: Nowithout intent/plan  Therapist Response: Reviewed patients progress and assessed her current functioning. She denies any paranoia or AH, but does endorse some VH she wants to keep. She is grounded in reality and oriented x3. Processed her anxiety related to her body. Explored her fears and negative consequences associated with increased vigilance about her body. Used CBT to assist patient with the identification of her negative distortions and irrational thoughts. Encouraged patient to verbalize alternative and factual responses which challenge her distortions.  Used motivational interviewing to assist and encourage patient through the change process. Explored patients barriers to change. Reviewed patients self care plan. Assessed her progress related to self care. Patient's self care is good. Recommend proper diet, regular exercise, socialization and recreation.   Plan: Return again in one weeks.  Diagnosis: Axis I: Bi polar disorder    Axis II: No diagnosis    Deatrice Spanbauer, LCSW 10/15/2011

## 2011-10-22 ENCOUNTER — Ambulatory Visit (INDEPENDENT_AMBULATORY_CARE_PROVIDER_SITE_OTHER): Payer: Medicare Other | Admitting: Licensed Clinical Social Worker

## 2011-10-22 DIAGNOSIS — F319 Bipolar disorder, unspecified: Secondary | ICD-10-CM | POA: Diagnosis not present

## 2011-10-23 DIAGNOSIS — F319 Bipolar disorder, unspecified: Secondary | ICD-10-CM | POA: Insufficient documentation

## 2011-10-23 NOTE — Progress Notes (Signed)
   THERAPIST PROGRESS NOTE  Session Time: 4:00pm-4:50pm  Participation Level: Active  Behavioral Response: Well GroomedAlertAnxious and Euthymic  Type of Therapy: Individual Therapy  Treatment Goals addressed: Coping  Interventions: CBT, Solution Focused, Strength-based and Reframing  Summary: Michaela Norris is a 36 y.o. female who presents with euthymic mood and bright affect. She reports enjoying her time off from school and has been focusing on increasing her understanding of self. She discusses questions which have arisen while in graduate school, such as social interaction, her difficulty reading social cues and the types of friends she chooses. She questions why she tends to befriend others who have learning disabilities. She is intimidated to befriend those of equal or greater intellegnece and wants to understand why she does this. She has been spending time at her parents home and is bothered by the noise and chaos. She realizes that she disengages and dissociates to an extent in order to manage this chaos. Her frustration with her daughter has decreased, but she remains concerned about her future. Her AH and paranoia remain at baseline. Her sleep is improving and her appetite is wnl.    Suicidal/Homicidal: Nowithout intent/plan  Therapist Response: Reviewed patients progress and assessed her current functioning. She demonstrates growing insight into her behaviors and a deep curiosity about herself. Explored her questions regarding her struggles with social interaction. Patient lacks confidence with her interaction with others. She has motivation to change this, but remains unsure if she wants to. She holds firm distorted and negative beliefs about economic class and her experience growing up "poor". Used CBT to assist patient with the identification of her negative distortions and irrational thoughts. Encouraged patient to verbalize alternative and factual responses which challenge her  distortions. Reviewed patients self care plan. Assessed her progress related to self care. Patient's self care is good. Recommend proper diet, regular exercise, socialization and recreation.   Plan: Return again in one weeks.  Diagnosis: Axis I: bi-polar disorder    Axis II: No diagnosis    Sanika Brosious, LCSW 10/23/2011

## 2011-10-31 ENCOUNTER — Ambulatory Visit (HOSPITAL_COMMUNITY): Payer: Self-pay | Admitting: Licensed Clinical Social Worker

## 2011-11-07 ENCOUNTER — Ambulatory Visit (INDEPENDENT_AMBULATORY_CARE_PROVIDER_SITE_OTHER): Payer: Medicaid Other | Admitting: Licensed Clinical Social Worker

## 2011-11-07 DIAGNOSIS — F319 Bipolar disorder, unspecified: Secondary | ICD-10-CM

## 2011-11-07 NOTE — Progress Notes (Signed)
   THERAPIST PROGRESS NOTE  Session Time: 1:00pm-1:50pm  Participation Level: Active  Behavioral Response: Well GroomedAlertEuthymic  Type of Therapy: Individual Therapy  Treatment Goals addressed: Coping  Interventions: CBT, Motivational Interviewing, Supportive and Reframing  Summary: Michaela Norris is a 36 y.o. female who presents with euthymic mood and bright affect. She reports having a wonderful week last week because her family surprised her and took her to the beach for her birthday. She is surprised that her family thought of her and decided to do something for her, which is not typical. She has been trying to enjoy her time off from school, but finds herself frustrated with her daughter and she does not want to return to her Masters program. She will, but wants to pursue something else, she is just uncertain what that is. She reports an absence of AH, VH or paranoia. Her primary stressor is dealing with her daughter, who manipulates her. Her sleep and appetite are wnl.    Suicidal/Homicidal: Nowithout intent/plan  Therapist Response: Reviewed patients progress and assessed her current functioning. Patients psychosis is well controlled, her thinking is clear and her insight is improving. She struggles to set and keep firm boundaries with her daughter. Discussed parenting and boundary setting. Patient has difficulty tolerating her negative feelings which her daughter elicits. Used CBT to assist patient with the identification of her negative distortions and irrational thoughts. Encouraged patient to verbalize alternative and factual responses which challenge her distortions. Reviewed patients self care plan. Assessed her progress related to self care. Patient's self care is good. Recommend proper diet, regular exercise, socialization and recreation.   Plan: Return again in two weeks.  Diagnosis: Axis I: bi polar    Axis II: No diagnosis    Kentarius Partington, LCSW 11/07/2011

## 2011-11-14 ENCOUNTER — Ambulatory Visit (HOSPITAL_COMMUNITY): Payer: Self-pay | Admitting: Licensed Clinical Social Worker

## 2011-11-22 ENCOUNTER — Ambulatory Visit (INDEPENDENT_AMBULATORY_CARE_PROVIDER_SITE_OTHER): Payer: Medicaid Other | Admitting: Licensed Clinical Social Worker

## 2011-11-22 DIAGNOSIS — F319 Bipolar disorder, unspecified: Secondary | ICD-10-CM

## 2011-11-22 NOTE — Progress Notes (Signed)
   THERAPIST PROGRESS NOTE  Session Time: 11:30am-12:20pm  Participation Level: Active  Behavioral Response: Well GroomedAlertAnxious  Type of Therapy: Individual Therapy  Treatment Goals addressed: Coping  Interventions: CBT, Solution Focused and Reframing  Summary: Michaela Norris is a 36 y.o. female who presents with anxious mood and affect. Her disability is being reviewed and she is fearful that it will be taken away from her. She is afraid that she will be punished because she attends school. She is paranoid about the intentions of the government and is highly anxious in session. She is under financial stress and has had to spend time at her parents because she can't afford food. She processes her frustration while at her families and her inability to ignore the dysfunction in her family any more. This anxiety manifests itself in somatic symptoms. She is excited to see her new doctor, but concerned about her pain levels and fearful that she will be blown off again. Her sleep is wnl and her appetite is increased.     Suicidal/Homicidal: Nowithout intent/plan  Therapist Response: Processed w/pt her anxiety. Used DBT to help build mindfulness and distress tolerance. Reviewed distraction list. Used CBT to assist patient with the identification of her negative distortions and irrational thoughts. Encouraged patient to verbalize alternative and factual responses which challenge her distortions. Processed her fear about her body and explored her readiness to have weight loss surgery. She demonstrates fear of failure and fear that she will have to interact with the world differently. Processed her weight as protection. Used motivational interviewing to assist and encourage patient through the change process. Explored patients barriers to change. Reviewed patients self care plan. Assessed her progress related to self care. Patient's self care is good. Recommend proper diet, regular exercise,  socialization and recreation.   Plan: Return again in one weeks.  Diagnosis: Axis I: bipolar     Axis II: No diagnosis    Petrina Melby, LCSW 11/22/2011

## 2011-11-29 ENCOUNTER — Ambulatory Visit (INDEPENDENT_AMBULATORY_CARE_PROVIDER_SITE_OTHER): Payer: Medicaid Other | Admitting: Licensed Clinical Social Worker

## 2011-11-29 ENCOUNTER — Ambulatory Visit: Payer: Self-pay | Admitting: Family Medicine

## 2011-11-29 DIAGNOSIS — F319 Bipolar disorder, unspecified: Secondary | ICD-10-CM

## 2011-11-29 NOTE — Progress Notes (Signed)
   THERAPIST PROGRESS NOTE  Session Time: 3:00pm-3:50pm  Participation Level: Active  Behavioral Response: Well GroomedAlertAnxious  Type of Therapy: Individual Therapy  Treatment Goals addressed: Anxiety and Coping  Interventions: CBT, Supportive and Reframing  Summary: Michaela Norris is a 36 y.o. female who presents with anxious mood and affect. She reports that she has been thinking a lot about relocating and exploring if this is the best decision for her and her daughter. She wants to move to [place] to be close to her good friend who needs her help taking care of a new born. She acknowledges that she needs to be needed and that because her family does not need her and her daughter is "mean" to her, that this would fulfill her desire to be needed. She has some understanding that moving to [place] for this reason only, would not be a positive decision. She questions if she would like it there, if she could get good mental health services there and where she would like to live. She discusses her concern for her friend, who she suspects has paranoid schizophrenia. She continues to struggle with her daughter and struggles to accept her daughters limitations. Her sleep and appetite are wnl.   Suicidal/Homicidal: Nowithout intent/plan  Therapist Response: Processed w/pt her feelings of anxiety about relocating. Explored her doubts and reasons for moving. Provided feedback, assisting her with increased insight into her unrealistic expectations of her life is she moves. Encouraged patient to do research about services down there. Processed her need to be needed and how this sometimes leads to poor decision making and poor judgment. Used CBT to assist patient with the identification of her negative distortions and irrational thoughts. Encouraged patient to verbalize alternative and factual responses which challenge her distortions. Reviewed patients self care plan. Assessed her progress related to  self care. Patient's self care is fair. Recommend proper diet, regular exercise, socialization and recreation.   Plan: Return again in one weeks.  Diagnosis: Axis I: Bipolar, mixed    Axis II: No diagnosis    Lamar Meter, LCSW 11/29/2011

## 2011-12-06 ENCOUNTER — Encounter (HOSPITAL_COMMUNITY): Payer: Self-pay | Admitting: Licensed Clinical Social Worker

## 2011-12-06 ENCOUNTER — Ambulatory Visit (INDEPENDENT_AMBULATORY_CARE_PROVIDER_SITE_OTHER): Payer: Medicaid Other | Admitting: Licensed Clinical Social Worker

## 2011-12-06 DIAGNOSIS — F319 Bipolar disorder, unspecified: Secondary | ICD-10-CM | POA: Diagnosis not present

## 2011-12-06 NOTE — Progress Notes (Signed)
   THERAPIST PROGRESS NOTE  Session Time: 1:00pm-1:50pm  Participation Level: Active  Behavioral Response: Well GroomedAlertAnxious  Type of Therapy: Individual Therapy  Treatment Goals addressed: Coping  Interventions: CBT, Solution Focused and Reframing  Summary: Cammy Sanjurjo is a 36 y.o. female who presents with anxious mood and affect. She wants to make a decision about moving to [place] very soon. She feels she needs to decide in order to plan and address/calm her anxiety. She discusses the pros and cons of moving and her struggle to accept that it is okay to take care of herself. She is fearful of moving and fearful that her family will punish her for this decision.   Suicidal/Homicidal: Nowithout intent/plan  Therapist Response: Assisted patient with the expression of her feelings of anxiety, fear, confusion and uncertainty. Used CBT to assist patient with the identification of her negative distortions and irrational thoughts. Encouraged patient to verbalize alternative and factual responses which challenge her distortions. Used motivational interviewing to assist and encourage patient through the change process. Explored patients barriers to change. Reviewed patients self care plan. Assessed her progress related to self care. Patient's self care is good. Recommend proper diet, regular exercise, socialization and recreation.   Plan: Return again in one weeks.  Diagnosis: Axis I: bi polar     Axis II: No diagnosis    Nil Bolser, LCSW 12/06/2011

## 2011-12-13 ENCOUNTER — Ambulatory Visit (HOSPITAL_COMMUNITY): Payer: Self-pay | Admitting: Licensed Clinical Social Worker

## 2011-12-20 ENCOUNTER — Ambulatory Visit (INDEPENDENT_AMBULATORY_CARE_PROVIDER_SITE_OTHER): Payer: Medicare Other | Admitting: Licensed Clinical Social Worker

## 2011-12-20 DIAGNOSIS — F319 Bipolar disorder, unspecified: Secondary | ICD-10-CM

## 2011-12-20 NOTE — Progress Notes (Signed)
   THERAPIST PROGRESS NOTE  Session Time: 1:00pm-1:50pm  Participation Level: Active  Behavioral Response: Well GroomedAlertAnxious  Type of Therapy: Individual Therapy  Treatment Goals addressed: Coping  Interventions: CBT, Supportive, Reframing and Other: reality testing  Summary: Jaidah Lomax is a 36 y.o. female who presents with anxious mood and affect. She reports worsening symptoms, with increased anxiety, AH and VH. She denies paranoia but will not talk in any detail about her AH and VH because she is being told she can't. She is unable to sleep and believes her psychosis is a result of sleep deprivation. She does not want to take any medication to help her sleep because none have worked but she is willing to try OTC cold medication which causes drowsiness. She feels overwhelmed by her senses. She has too much time on her hands and realizes that this is contributing to her racing thoughts. Her appetite is poor.    Suicidal/Homicidal: Nowithout intent/plan  Therapist Response: Assessed patients current functioning and reviewed progress. Provided reality testing, which patient is unable to fully accept. Despite redirection, she refuses to discuss her delusions. Assessed patients safety and determined that she is safe. Discussed coping strategies and safety plan. Discussed plan to reduce stress and calm her senses. Advised patient to contact this therapist if she is unable to sleep over the weekend. Used CBT to assist patient with the identification of her negative distortions and irrational thoughts. Encouraged patient to verbalize alternative and factual responses which challenge her distortions. Reviewed patients self care plan. Assessed her progress related to self care. Patient's self care is fair. Recommend proper diet, regular exercise, socialization and recreation.   Plan: Return again in one weeks.  Diagnosis: Axis I: bi polar    Axis II: No diagnosis    Takina Busser,  LCSW 12/20/2011

## 2011-12-21 ENCOUNTER — Encounter (HOSPITAL_COMMUNITY): Payer: Self-pay | Admitting: Cardiology

## 2011-12-21 ENCOUNTER — Emergency Department (INDEPENDENT_AMBULATORY_CARE_PROVIDER_SITE_OTHER)
Admission: EM | Admit: 2011-12-21 | Discharge: 2011-12-21 | Disposition: A | Discharge status: Home / Self Care | Payer: Medicare Other | Source: Home / Self Care | Attending: Emergency Medicine | Admitting: Emergency Medicine

## 2011-12-21 DIAGNOSIS — L0390 Cellulitis, unspecified: Secondary | ICD-10-CM | POA: Diagnosis not present

## 2011-12-21 DIAGNOSIS — R7309 Other abnormal glucose: Secondary | ICD-10-CM

## 2011-12-21 DIAGNOSIS — L0291 Cutaneous abscess, unspecified: Secondary | ICD-10-CM | POA: Diagnosis not present

## 2011-12-21 LAB — GLUCOSE, CAPILLARY: Glucose-Capillary: 91 mg/dL (ref 70–99)

## 2011-12-21 MED ORDER — CEPHALEXIN 500 MG PO CAPS: 500.0000 mg | ORAL_CAPSULE | Freq: Four times a day (QID) | ORAL | Status: AC

## 2011-12-21 NOTE — ED Provider Notes (Signed)
History     CSN: [id number]  Arrival date & time 12/21/11  1719   None     Chief Complaint  Patient presents with  . Skin Problem    (Consider location/radiation/quality/duration/timing/severity/associated sxs/prior treatment) HPI Comments: Pt awoke from sleep night of 7/23 with pain in L great toe.  Noticed small red area on top of toe that was painful.  Since then area has gotten progressively more painful, red, warmth, swollen.   Patient is a 36 y.o. female presenting with toe pain. The history is provided by the patient.  Toe Pain This is a new problem. Episode onset: 4 days ago. The problem occurs constantly. The problem has been gradually worsening. Exacerbated by: touching it. Nothing relieves the symptoms. Treatments tried: ibuprofen. The treatment provided mild (mild transient improvement) relief.    Past Medical History  Diagnosis Date  . Anxiety   . Depression   . Headache   . Psychosis   . Schizoaffective disorder   . Mania   . Obesity   . PTSD (post-traumatic stress disorder)   . High blood pressure     off meds at this time    Past Surgical History  Procedure Date  . Fracture surgery   . Cholecystectomy     Family History  Problem Relation Age of Onset  . Alcohol abuse Mother   . Depression Mother   . Alcohol abuse Brother   . Suicidality Maternal Grandmother   . Anorexia nervosa Maternal Grandmother   . Asperger's syndrome Daughter     History  Substance Use Topics  . Smoking status: Never Smoker   . Smokeless tobacco: Never Used  . Alcohol Use: No    OB History    Grav Para Term Preterm Abortions TAB SAB Ect Mult Living                  Review of Systems  Constitutional: Negative for fever and chills.  Musculoskeletal:       Toe pain  Skin: Positive for color change.    Allergies  Latex; Tegretol; and Vicodin  Home Medications   Current Outpatient Rx  Name Route Sig Dispense Refill  . CEPHALEXIN 500 MG PO CAPS Oral Take 1  capsule (500 mg total) by mouth 4 (four) times daily. 40 capsule 0  . ONE-DAILY MULTI VITAMINS PO TABS Oral Take 1 tablet by mouth daily.      BP 153/80  Pulse 75  Temp 98.7 F (37.1 C) (Oral)  Resp 18  SpO2 96%  LMP 12/17/2011  Physical Exam  Constitutional: She appears well-developed and well-nourished. No distress.  Pulmonary/Chest: Effort normal.  Musculoskeletal:       Left foot: She exhibits tenderness and swelling. She exhibits no bony tenderness.       Feet:  Skin: Skin is warm and dry. There is erythema.       See msk exam.      ED Course  Procedures (including critical care time)   Labs Reviewed  GLUCOSE, CAPILLARY   No results found.   1. Cellulitis       MDM  Blood sugar is 91 today.   No bp was recorded in chart. Asked RN to obtain bp prior to pt d/c, as long as was normal, pt could go home.  Pt was released without obtaining bp.         Cathlyn Parsons, NP 12/21/11 2032

## 2011-12-21 NOTE — ED Notes (Addendum)
Pt reports bump to left great toe started this past Wed. The area has become larger. It has been painful and itching. Pt reports fever of 100 earlier today.

## 2011-12-22 NOTE — ED Provider Notes (Signed)
Medical screening examination/treatment/procedure(s) were performed by non-physician practitioner and as supervising physician I was immediately available for consultation/collaboration.  Leslee Home, M.D.   Reuben Likes, MD 12/22/11 [phone]

## 2011-12-25 ENCOUNTER — Ambulatory Visit (INDEPENDENT_AMBULATORY_CARE_PROVIDER_SITE_OTHER): Payer: Medicare Other | Admitting: Family Medicine

## 2011-12-25 ENCOUNTER — Encounter: Payer: Self-pay | Admitting: Family Medicine

## 2011-12-25 VITALS — BP 135/80 | HR 88 | Temp 98.4°F | Ht 68.0 in | Wt 351.4 lb

## 2011-12-25 DIAGNOSIS — Z6841 Body Mass Index (BMI) 40.0 and over, adult: Secondary | ICD-10-CM | POA: Diagnosis not present

## 2011-12-25 DIAGNOSIS — E785 Hyperlipidemia, unspecified: Secondary | ICD-10-CM

## 2011-12-25 DIAGNOSIS — F259 Schizoaffective disorder, unspecified: Secondary | ICD-10-CM | POA: Insufficient documentation

## 2011-12-25 DIAGNOSIS — D649 Anemia, unspecified: Secondary | ICD-10-CM | POA: Diagnosis not present

## 2011-12-25 DIAGNOSIS — G932 Benign intracranial hypertension: Secondary | ICD-10-CM

## 2011-12-25 DIAGNOSIS — R768 Other specified abnormal immunological findings in serum: Secondary | ICD-10-CM

## 2011-12-25 DIAGNOSIS — L03039 Cellulitis of unspecified toe: Secondary | ICD-10-CM

## 2011-12-25 DIAGNOSIS — R7303 Prediabetes: Secondary | ICD-10-CM | POA: Insufficient documentation

## 2011-12-25 DIAGNOSIS — R894 Abnormal immunological findings in specimens from other organs, systems and tissues: Secondary | ICD-10-CM

## 2011-12-25 DIAGNOSIS — M2550 Pain in unspecified joint: Secondary | ICD-10-CM | POA: Insufficient documentation

## 2011-12-25 DIAGNOSIS — R7309 Other abnormal glucose: Secondary | ICD-10-CM | POA: Diagnosis not present

## 2011-12-25 DIAGNOSIS — L02619 Cutaneous abscess of unspecified foot: Secondary | ICD-10-CM

## 2011-12-25 LAB — CBC WITH DIFFERENTIAL/PLATELET
Basophils Absolute: 0.1 10*3/uL (ref 0.0–0.1)
Basophils Relative: 0.9 % (ref 0.0–3.0)
Eosinophils Absolute: 0.2 10*3/uL (ref 0.0–0.7)
Lymphocytes Relative: 23.9 % (ref 12.0–46.0)
MCHC: 32 g/dL (ref 30.0–36.0)
MCV: 72.3 fl — ABNORMAL LOW (ref 78.0–100.0)
Monocytes Absolute: 0.3 10*3/uL (ref 0.1–1.0)
Neutrophils Relative %: 68 % (ref 43.0–77.0)
Platelets: 205 10*3/uL (ref 150.0–400.0)
RBC: 4.92 Mil/uL (ref 3.87–5.11)
WBC: 7.5 10*3/uL (ref 4.5–10.5)

## 2011-12-25 LAB — TSH: TSH: 1.5 u[IU]/mL (ref 0.35–5.50)

## 2011-12-25 LAB — SEDIMENTATION RATE: Sed Rate: 30 mm/hr — ABNORMAL HIGH (ref 0–22)

## 2011-12-25 LAB — BASIC METABOLIC PANEL
Chloride: 106 mEq/L (ref 96–112)
GFR: 130.82 mL/min (ref >60.00)
Potassium: 4.1 mEq/L (ref 3.5–5.1)
Sodium: 141 mEq/L (ref 135–145)

## 2011-12-25 LAB — HEPATIC FUNCTION PANEL
ALT: 19 U/L (ref 0–35)
AST: 21 U/L (ref 0–37)
Alkaline Phosphatase: 69 U/L (ref 39–117)
Bilirubin, Direct: 0 mg/dL (ref 0.0–0.3)
Total Bilirubin: 0.5 mg/dL (ref 0.3–1.2)

## 2011-12-25 LAB — LIPID PANEL
HDL: 31.3 mg/dL — ABNORMAL LOW (ref >39.00)
Total CHOL/HDL Ratio: 4
VLDL: 53.8 mg/dL — ABNORMAL HIGH (ref 0.0–40.0)

## 2011-12-25 LAB — LDL CHOLESTEROL, DIRECT: Direct LDL: 56.4 mg/dL

## 2011-12-25 NOTE — Progress Notes (Signed)
  Subjective:    Patient ID: Michaela Norris, female    DOB: Oct 03, 1975, 36 y.o.   MRN: [id number]  HPI New to establish.  Previous MD- [employer].  Therapist- Behavioral Health  Psych- Dr Alphia Kava at [facility]  Schizoaffective Disorder- 'it's dx 295.72'.  Denies dx of bipolar.  Denies 'going to a dark place w/ dangerous psychosis'.  Not currently on meds.  'i think i'm exceptionally good at managing until i get into high stress situations'.  When stressed out will 'lose time'.  Has not occurred recently- 'last serious break was 2010'.  Seeing therapist weekly- she's not comfortable about pt being off meds.  Pt reports he has had side effects and problems w/ multiple meds in the past.  Dierdre Searles tends to control sxs but worsens pseudotumor.  Pseudotumor Cerebrii- dx'd 3 yrs ago.  Has not seen neuro recently for this.  Was told by eye doctor that she had swelling in optic nerve- sent to neuro who did MRI that was inconclusive.  Was recommended to have LP which is when pt 'backed out'.  Will have frequent eye pain, 'particularly on the R'.  Some HAs.  Was unable to tolerate Diamox.  Made lifestyle changes and sxs are 'some better'.  Elevated cholesterol/borderline DM- started on meds 10 yrs ago for both but has not taken any meds in ~3 yrs.  Last labs were done ~6 months ago.  Doing some exercise and watching diet.  Anemia- pt has had GI w/u to r/o bleed.  Difficulty tolerating oral iron.  Taking childrens MVI w/ iron and eating diet high in iron.  + fatigue, diffuse joint pains.  L toe infection- went to UC on 7/27 and started on Keflex.  No longer having drainage or pain- wants to avoid UC f/u.   Review of Systems For ROS see HPI     Objective:   Physical Exam  Vitals reviewed. Constitutional: She is oriented to person, place, and time. She appears well-developed and well-nourished. No distress.       Morbidly obese  HENT:  Head: Normocephalic and atraumatic.  Eyes: Conjunctivae and EOM are normal.  Pupils are equal, round, and reactive to light.  Neck: Normal range of motion. Neck supple.  Cardiovascular: Normal rate, regular rhythm, normal heart sounds and intact distal pulses.   Pulmonary/Chest: Effort normal and breath sounds normal. No respiratory distress. She has no wheezes. She has no rales.  Abdominal: Soft. Bowel sounds are normal.       Morbidly obese  Musculoskeletal: She exhibits no edema.  Lymphadenopathy:    She has no cervical adenopathy.  Neurological: She is alert and oriented to person, place, and time. No cranial nerve deficit. Coordination normal.  Skin: Skin is warm and dry.       Improving infxn of L great toe  Psychiatric: She has a normal mood and affect. Her behavior is normal.          Assessment & Plan:

## 2011-12-25 NOTE — Patient Instructions (Addendum)
Schedule your complete physical We'll notify you of your lab results Someone will call you with your Neuro and eye appts Try and get regular exercise Make healthy food choices Call with any questions or concerns Think of [place] as your home base Welcome!  We're glad to have you!

## 2011-12-26 ENCOUNTER — Encounter (HOSPITAL_COMMUNITY): Payer: Self-pay | Admitting: Licensed Clinical Social Worker

## 2011-12-26 ENCOUNTER — Ambulatory Visit (INDEPENDENT_AMBULATORY_CARE_PROVIDER_SITE_OTHER): Payer: Medicare Other | Admitting: Licensed Clinical Social Worker

## 2011-12-26 DIAGNOSIS — F319 Bipolar disorder, unspecified: Secondary | ICD-10-CM | POA: Diagnosis not present

## 2011-12-26 LAB — ANA: Anti Nuclear Antibody(ANA): POSITIVE — AB

## 2011-12-26 NOTE — Progress Notes (Signed)
   THERAPIST PROGRESS NOTE  Session Time: 1:00pm-1:50pm  Participation Level: Active  Behavioral Response: Well GroomedAlertAnxious  Type of Therapy: Individual Therapy  Treatment Goals addressed: Coping  Interventions: CBT, Motivational Interviewing, Supportive and Reframing  Summary: Michaela Norris is a 36 y.o. female who presents with anxious mood and bright affect. She reports improved mood, decreased stress, decreased paranoia with decreased AH and VH. She reports making significant changes in her diet by cutting her portion size down and processes her surprise over how easy this has become. She describes her experience changing behavior, which she must think about for a long time before she makes the change, but when she does, she is often shocked by it. She is ready to loose more weight and demonstrates progress related her food addiction. She has been focusing more on improving her self care and getting increased sleep.    Suicidal/Homicidal: Nowithout intent/plan  Therapist Response: Assessed patients current functioning and reviewed progress. Evaluated the continued and important changes patient has made to her lifestyle and processed her readiness to loose more weight. She wants to loose 100 pounds and is willing to challenge herself to do this. Discussed the support that patient will need. Reviewed coping strategies to manage anxiety related to school starting in two weeks. Used DBT to help patient use mindfulness, distraction techniques and build distress tolerance. Used CBT to assist patient with the identification of her negative distortions and irrational thoughts. Encouraged patient to verbalize alternative and factual responses which challenge her distortions. Reviewed patients self care plan. Assessed her progress related to self care. Patient's self care is good. Recommend proper diet, regular exercise, socialization and recreation.   Plan: Return again in one  weeks.  Diagnosis: Axis I: bi -polar     Axis II: No diagnosis    Michaela Canizares, LCSW 12/26/2011

## 2011-12-31 ENCOUNTER — Ambulatory Visit (HOSPITAL_COMMUNITY): Payer: Medicare Other | Admitting: Licensed Clinical Social Worker

## 2011-12-31 ENCOUNTER — Telehealth: Payer: Self-pay | Admitting: *Deleted

## 2011-12-31 DIAGNOSIS — F319 Bipolar disorder, unspecified: Secondary | ICD-10-CM

## 2011-12-31 MED ORDER — FENOFIBRATE 160 MG PO TABS: 160.0000 mg | ORAL_TABLET | Freq: Every day | ORAL | Status: DC

## 2011-12-31 NOTE — Progress Notes (Unsigned)
   THERAPIST PROGRESS NOTE  Session Time: 4:00pm-4:50pm  Participation Level: Active  Behavioral Response: Well GroomedAlertAnxious and Depressed  Type of Therapy: Individual Therapy  Treatment Goals addressed: Coping  Interventions: CBT, Solution Focused, Supportive and Reframing  Summary: Michaela Norris is a 36 y.o. female who presents with anxious mood and affect. She is tearful when reporting that the new doctor she saw did not call her back with results from her blood work and instead, she had to call them to inquire. She received the results via e-mail and is upset over testing positive for an autoimmune disease. She processes her anger and feels that this confirms her conspiracy theory that health care is out to punish certain people. She is able to be redirected and can respond to reality testing.   Suicidal/Homicidal: {BHH YES OR NO:22294}{yes/no/with/without intent/plan:22693}  Therapist Response: ***  Plan: Return again in *** weeks.  Diagnosis: Axis I: {psych axis 1:31909}    Axis II: {psych axis 2:31910}    Michaela Shevchenko, LCSW 12/31/2011

## 2011-12-31 NOTE — Telephone Encounter (Signed)
Done

## 2011-12-31 NOTE — Telephone Encounter (Signed)
Pt advise results via my chart Placed referral in chart for Rhuem per positive ANA, sent new RX for fenofibrite to pharmacy via escribe, left message that someone will call her with [facility] asap. Noted waiver in pt chart signed to allow detailed messages to be left on voicemail, left detailed message about: results/instructions/prescribtion information. Advise if any further concerns or questions please call our office at [phone].

## 2012-01-07 NOTE — Assessment & Plan Note (Signed)
New.  Pt following w/ therapist.  Not currently on meds.  Made it clear that I would not treat her psych issues and that will be left to her mental health team.

## 2012-01-07 NOTE — Assessment & Plan Note (Signed)
New to provider, chronic for pt.  Unable to tolerate oral iron in the past.  Check labs and determine whether this requires tx.

## 2012-01-07 NOTE — Assessment & Plan Note (Signed)
New to provider.  This is pt's most serious health risk at this time.  Limited exercise.  Recently started to watch diet.  Stressed importance of losing weight and its overall impact on her health.  Will follow closely.

## 2012-01-07 NOTE — Assessment & Plan Note (Signed)
New.  Improving on current meds.  Finish Keflex as prescribed.

## 2012-01-07 NOTE — Assessment & Plan Note (Signed)
New to provider, chronic for pt.  Was previously on meds.  Will need to check labs and determine whether meds are currently required.  Encouraged healthy diet and regular exercise.

## 2012-01-07 NOTE — Assessment & Plan Note (Signed)
New to provider, chronic for pt.  Has not followed up w/ neuro as suggested.  Will re-refer.  Again stressed importance of healthy diet and regular exercise.

## 2012-01-07 NOTE — Assessment & Plan Note (Signed)
New to provider.  Chronic for pt.  Reports she was previously on meds but not recently.  Will check labs and determine if meds are required.

## 2012-01-07 NOTE — Assessment & Plan Note (Signed)
New.  This was pt's 'oh by the way'.  May be related to her current size but will check labs to r/o rheumatologic process

## 2012-01-09 ENCOUNTER — Encounter (HOSPITAL_COMMUNITY): Payer: Self-pay | Admitting: Licensed Clinical Social Worker

## 2012-01-09 ENCOUNTER — Ambulatory Visit (INDEPENDENT_AMBULATORY_CARE_PROVIDER_SITE_OTHER): Payer: Medicare Other | Admitting: Licensed Clinical Social Worker

## 2012-01-09 DIAGNOSIS — F319 Bipolar disorder, unspecified: Secondary | ICD-10-CM

## 2012-01-09 NOTE — Progress Notes (Signed)
   THERAPIST PROGRESS NOTE  Session Time: 2:00pm-2:50pm  Participation Level: Active  Behavioral Response: Well GroomedAlertAnxious  Type of Therapy: Individual Therapy  Treatment Goals addressed: Coping  Interventions: CBT, Motivational Interviewing, Supportive and Reframing  Summary: Michaela Norris is a 36 y.o. female who presents with euthymic mood and anxious affect. She is due to start school on Monday and is not looking forward to this. She has decided to take only on line classes because she is displeased with her graduate program and has been doing well this summer and she does not want to change her patterns. She realizes that she in order to make successful behavioral changes in her life that she must remain intently focused on one goal. She is surprised how well she has been feeling this summer when she typically struggles due to lack of externally imposed structure. She wants to be home more often so she can continue to focus on her diet, changing her eating habits and maintaining her health. She continues to process her desire to move to [place] and struggles with waiting. Her sleep has improved.    Suicidal/Homicidal: Nowithout intent/plan  Therapist Response: Assessed patients current functioning and reviewed progress. Reviewed coping strategies. Assessed patients safety and assisted in identifying protective factors.  Reviewed crisis plan with patient. Assisted patient with the expression of her feelings of frustration and anxiety related to starting school. Processed negative consequences and triggers patient needs to look for since her semester will involve more social isolation. Used CBT to assist patient with the identification of negative distortions and irrational thoughts. Encouraged patient to verbalize alternative and factual responses which challenge thought distortions. She remains sedentary and knows she needs to exercise. Used motivational interviewing to assist and  encourage patient through the change process. Explored patients barriers to change. Reviewed patients self care plan. Assessed  progress related to self care. Patient's self care is improving. Recommend proper diet, regular exercise, socialization and recreation.   Plan: Return again in one weeks.  Diagnosis: Axis I: bi polar     Axis II: No diagnosis    Saida Lonon, LCSW 01/09/2012

## 2012-01-15 ENCOUNTER — Encounter (INDEPENDENT_AMBULATORY_CARE_PROVIDER_SITE_OTHER): Payer: Self-pay | Admitting: Ophthalmology

## 2012-01-16 ENCOUNTER — Ambulatory Visit (INDEPENDENT_AMBULATORY_CARE_PROVIDER_SITE_OTHER): Payer: Medicare Other | Admitting: Licensed Clinical Social Worker

## 2012-01-16 ENCOUNTER — Encounter (HOSPITAL_COMMUNITY): Payer: Self-pay | Admitting: Licensed Clinical Social Worker

## 2012-01-16 DIAGNOSIS — F319 Bipolar disorder, unspecified: Secondary | ICD-10-CM

## 2012-01-16 NOTE — Progress Notes (Signed)
   THERAPIST PROGRESS NOTE  Session Time: 4:00pm-4:50pm  Participation Level: Active  Behavioral Response: Well GroomedAlertAnxious  Type of Therapy: Individual Therapy  Treatment Goals addressed: Coping  Interventions: CBT, Motivational Interviewing, Strength-based and Reframing  Summary: Michaela Norris is a 36 y.o. female who presents with euthymic mood and anxious affect. School has started and she is not pleased about returning. She has decided to take one class on campus to decrease her isolation and is pleased with this decision. She reports baseline AH and paranoia. She denies any VH. She continues to endorse concerns about her health and has appointments to see a neurologist, rheumatologist and optimologist. She is willing to undergo tests , except a lumbar puncture. She processes her fear over this test and how she wants to make further lifestyle changes in order to avoid this test. She endorses poor motivation for exercise and can't tolerate the physical changes which occur when working out hard. She finds her increased heart rate intolerable and is not willing to work on desensitizing herself to this yet. Her sleep and appetite are wnl.   Suicidal/Homicidal: Nowithout intent/plan  Therapist Response: Assessed patients current functioning and reviewed progress. Reviewed coping strategies. Assessed patients safety and assisted in identifying protective factors.  Reviewed crisis plan with patient. Assisted patient with the expression of her feelings of anxiety about school and her health. Processed her negative and distorted thoughts about her health.  Used CBT to assist patient with the identification of negative distortions and irrational thoughts. Encouraged patient to verbalize alternative and factual responses which challenge thought distortions. Used motivational interviewing to assist and encourage patient through the change process. Explored patients barriers to change. Reviewed  patients self care plan. Assessed  progress related to self care. Patient's self care is improving. Recommend proper diet, regular exercise, socialization and recreation.   Plan: Return again in one weeks.  Diagnosis: Axis I: bi polar disorder    Axis II: No diagnosis    Michaela Loy, LCSW 01/16/2012

## 2012-01-20 ENCOUNTER — Encounter (INDEPENDENT_AMBULATORY_CARE_PROVIDER_SITE_OTHER): Payer: Medicare Other | Admitting: Ophthalmology

## 2012-01-20 DIAGNOSIS — E1139 Type 2 diabetes mellitus with other diabetic ophthalmic complication: Secondary | ICD-10-CM | POA: Diagnosis not present

## 2012-01-20 DIAGNOSIS — E1165 Type 2 diabetes mellitus with hyperglycemia: Secondary | ICD-10-CM

## 2012-01-20 DIAGNOSIS — E11319 Type 2 diabetes mellitus with unspecified diabetic retinopathy without macular edema: Secondary | ICD-10-CM | POA: Diagnosis not present

## 2012-01-20 DIAGNOSIS — H43819 Vitreous degeneration, unspecified eye: Secondary | ICD-10-CM | POA: Diagnosis not present

## 2012-01-21 ENCOUNTER — Encounter (HOSPITAL_COMMUNITY): Payer: Self-pay | Admitting: Licensed Clinical Social Worker

## 2012-01-21 ENCOUNTER — Ambulatory Visit (INDEPENDENT_AMBULATORY_CARE_PROVIDER_SITE_OTHER): Payer: Medicare Other | Admitting: Licensed Clinical Social Worker

## 2012-01-21 DIAGNOSIS — F319 Bipolar disorder, unspecified: Secondary | ICD-10-CM | POA: Diagnosis not present

## 2012-01-21 NOTE — Progress Notes (Signed)
   THERAPIST PROGRESS NOTE  Session Time: 4:00pm-4:50pm  Participation Level: Active  Behavioral Response: Well GroomedAlertAnxious and Euthymic  Type of Therapy: Individual Therapy  Treatment Goals addressed: Anxiety and Coping  Interventions: CBT, Solution Focused, Strength-based and Reframing  Summary: Michaela Norris is a 36 y.o. female who presents with euthymic mood and anxious affect. She is doing well, with improved mood and improved motivation for school. She enjoys one of her classes, but is focused on finding another degree to follow up with because she is no longer interested in gender studies. 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 She questions her capacity to navigate the political landscape and feels that it is too much for her to manage. Conflict with her daughter is decreasing and she is pleased with this. She reports baseline and manageable AH with and absence of paranoia.    Suicidal/Homicidal: Nowithout intent/plan  Therapist Response: Assessed patients current functioning and reviewed progress. Reviewed coping strategies. Assessed patients safety and assisted in identifying protective factors.  Reviewed crisis plan with patient. Assisted patient with the expression of her feelings of anxiety about school and her future. Used CBT to assist patient with the identification of negative distortions and irrational thoughts. Encouraged patient to verbalize alternative and factual responses which challenge thought distortions. Used motivational interviewing to assist and encourage patient through the change process. Explored patients barriers to change. Reviewed patients self care plan. Assessed  progress related to self care. Patient's self care is fair. Recommend proper diet, regular exercise, socialization and recreation.   Plan: Return again in one to two weeks.  Diagnosis: Axis I: bi polar    Axis II: No diagnosis    Arkel Cartwright,  LCSW 01/21/2012

## 2012-01-22 DIAGNOSIS — M25519 Pain in unspecified shoulder: Secondary | ICD-10-CM | POA: Diagnosis not present

## 2012-01-22 DIAGNOSIS — R5381 Other malaise: Secondary | ICD-10-CM | POA: Diagnosis not present

## 2012-01-22 DIAGNOSIS — R5383 Other fatigue: Secondary | ICD-10-CM | POA: Diagnosis not present

## 2012-01-22 DIAGNOSIS — R894 Abnormal immunological findings in specimens from other organs, systems and tissues: Secondary | ICD-10-CM | POA: Diagnosis not present

## 2012-01-28 ENCOUNTER — Telehealth: Payer: Self-pay | Admitting: Family Medicine

## 2012-01-28 ENCOUNTER — Ambulatory Visit (INDEPENDENT_AMBULATORY_CARE_PROVIDER_SITE_OTHER): Payer: Medicare Other | Admitting: Internal Medicine

## 2012-01-28 ENCOUNTER — Encounter: Payer: Self-pay | Admitting: Internal Medicine

## 2012-01-28 VITALS — BP 132/86 | HR 72 | Temp 98.5°F | Wt 346.0 lb

## 2012-01-28 DIAGNOSIS — J069 Acute upper respiratory infection, unspecified: Secondary | ICD-10-CM | POA: Diagnosis not present

## 2012-01-28 MED ORDER — AZELASTINE HCL 0.1 % NA SOLN: 2.0000 | Freq: Two times a day (BID) | NASAL | Status: DC

## 2012-01-28 MED ORDER — AMOXICILLIN 500 MG PO CAPS: 1000.0000 mg | ORAL_CAPSULE | Freq: Two times a day (BID) | ORAL | Status: AC

## 2012-01-28 NOTE — Patient Instructions (Addendum)
Rest, fluids , tylenol For cough, take Mucinex DM twice a day as needed  For congestion use astelin nasal spray twice a day until you feel better Take the antibiotic as prescribed  (Amoxicillin) Call if no better in few days Call anytime if the symptoms are severe, you have high fever, short of breath, chest pain

## 2012-01-28 NOTE — Progress Notes (Signed)
  Subjective:    Patient ID: Michaela Norris, female    DOB: 1975/10/20, 37 y.o.   MRN: [id number]  HPI to the visit 6 days history of sore throat, having a cold. After 2 days, symptoms decreased but now for the last 48 hours she is coughing more, having more head congestion, coughing up clear sputum. Last night, couldn't sleep due to her symptoms, got SOB whenever she lay down.  Past medical history and medication list reviewed.   Review of Systems No chills, some subjective fever last night. No hemoptysis No nausea, vomiting, diarrhea or lower extremity edema. Patient is a nonsmoker, has no history of asthma.     Objective:   Physical Exam  General -- alert, well-developed, and morbidly obese appearing. No apparent distress.  Neck --no JVD at 45 HEENT -- TMs normal, throat w/o redness, face symmetric and not tender to palpation, nose white congested without discharge Lungs -- normal respiratory effort, no intercostal retractions, no accessory muscle use, and few rhonchi.   Heart-- normal rate, regular rhythm, no murmur, and no gallop.   Extremities-- no pretibial edema bilaterally  psych-- Cognition and judgment appear intact. Alert and cooperative with normal attention span and concentration.  not anxious appearing and not depressed appearing.       Assessment & Plan:   Symptoms consistent with a URI, I'm somehow concerned with her apparent orthopnea, however most likely symptoms are related to nasal congestion rather than a cardiac issue noting no JVD or lower extremity edema. Also, at some point many years ago was told she was allergic to penicillin but since then has taken several rounds of penicillin without apparent problems. Plan: see instructions

## 2012-01-28 NOTE — Telephone Encounter (Signed)
Spoke with pharmacy & they have the rx.

## 2012-01-28 NOTE — Telephone Encounter (Signed)
no medications at her pharmacy from todays visit, she will be leaving to go out of town in about an hour can you please call in

## 2012-02-04 ENCOUNTER — Ambulatory Visit (HOSPITAL_COMMUNITY): Payer: Self-pay | Admitting: Licensed Clinical Social Worker

## 2012-02-13 ENCOUNTER — Ambulatory Visit (HOSPITAL_COMMUNITY): Payer: Self-pay | Admitting: Licensed Clinical Social Worker

## 2012-02-18 ENCOUNTER — Ambulatory Visit (INDEPENDENT_AMBULATORY_CARE_PROVIDER_SITE_OTHER): Payer: Medicare Other | Admitting: Licensed Clinical Social Worker

## 2012-02-18 DIAGNOSIS — F319 Bipolar disorder, unspecified: Secondary | ICD-10-CM | POA: Diagnosis not present

## 2012-02-18 NOTE — Progress Notes (Signed)
   THERAPIST PROGRESS NOTE  Session Time: 3:00pm-3:50pm  Participation Level: Active  Behavioral Response: Well GroomedAlertEuthymic  Type of Therapy: Individual Therapy  Treatment Goals addressed: Coping  Interventions: CBT, Motivational Interviewing, Supportive and Reframing  Summary: Michaela Norris is a 36 y.o. female who presents with euthymic mood and bright affect. She has recently returned from a trip to [place] where she had a wonderful time. She is pleased that her confidence has increased and she feels that she is able to navigate the parks on her own. She has also traveled to a tournament for [employer] and even though she struggled with over stimulation, she is pleased that she was able to adapt. She is tolerating school, but not enjoying it. She did see a ruematologist who did not diagnose her with anything. Instead of feeling invalidated as she normally does, she feels affirmed and confident that there is nothing wrong with her and that her pain is from her obesity. She is willing to discuss gastric bypass surgery with her doctor. Her sleep remains inconsistent and her appetite is wnl.    Suicidal/Homicidal: Nowithout intent/plan  Therapist Response: Assessed patients current functioning and reviewed progress. Reviewed coping strategies. Assessed patients safety and assisted in identifying protective factors.  Reviewed crisis plan with patient. Assisted patient with the expression of her feelings of frustration with finding what she is good at. Processed patients relief of anxiety regarding her physical health and her acceptance of what her obesity does to her body. While her resistance may look like denial, her processing is slow and therefore she needs an extraordinary amount of time to accept and understand. Used CBT to assist patient with the identification of negative distortions and irrational thoughts. Encouraged patient to verbalize alternative and factual responses  which challenge thought distortions. Reviewed patients self care plan. Assessed  progress related to self care. Patient's self care is good. Recommend proper diet, regular exercise, socialization and recreation.   Plan: Return again in one weeks.  Diagnosis: Axis I: bi polar     Axis II: No diagnosis    Dillan Lunden, LCSW 02/18/2012

## 2012-02-26 ENCOUNTER — Encounter: Payer: Self-pay | Admitting: Family Medicine

## 2012-02-26 ENCOUNTER — Other Ambulatory Visit (HOSPITAL_COMMUNITY)
Admission: RE | Admit: 2012-02-26 | Discharge: 2012-02-26 | Disposition: A | Discharge status: Home / Self Care | Payer: Medicare Other | Source: Ambulatory Visit | Attending: Family Medicine | Admitting: Family Medicine

## 2012-02-26 ENCOUNTER — Ambulatory Visit (INDEPENDENT_AMBULATORY_CARE_PROVIDER_SITE_OTHER): Payer: Medicare Other | Admitting: Family Medicine

## 2012-02-26 VITALS — BP 120/84 | HR 82 | Temp 98.3°F | Ht 68.25 in | Wt 344.4 lb

## 2012-02-26 DIAGNOSIS — Z124 Encounter for screening for malignant neoplasm of cervix: Secondary | ICD-10-CM | POA: Insufficient documentation

## 2012-02-26 DIAGNOSIS — M2550 Pain in unspecified joint: Secondary | ICD-10-CM

## 2012-02-26 DIAGNOSIS — E785 Hyperlipidemia, unspecified: Secondary | ICD-10-CM

## 2012-02-26 DIAGNOSIS — Z01419 Encounter for gynecological examination (general) (routine) without abnormal findings: Secondary | ICD-10-CM | POA: Diagnosis not present

## 2012-02-26 DIAGNOSIS — Z6841 Body Mass Index (BMI) 40.0 and over, adult: Secondary | ICD-10-CM

## 2012-02-26 DIAGNOSIS — Z0000 Encounter for general adult medical examination without abnormal findings: Secondary | ICD-10-CM

## 2012-02-26 LAB — HEPATIC FUNCTION PANEL
ALT: 22 U/L (ref 0–35)
AST: 21 U/L (ref 0–37)
Total Bilirubin: 1 mg/dL (ref 0.3–1.2)
Total Protein: 7.1 g/dL (ref 6.0–8.3)

## 2012-02-26 LAB — LIPID PANEL
Cholesterol: 116 mg/dL (ref 0–200)
LDL Cholesterol: 58 mg/dL (ref 0–99)
Triglycerides: 141 mg/dL (ref 0.0–149.0)

## 2012-02-26 NOTE — Progress Notes (Signed)
Subjective:    Patient ID: Michaela Norris, female    DOB: 10-10-1975, 36 y.o.   MRN: [id number]  HPI Here today for CPE.  Risk Factors: Hyperlipidemia- pt was dx'd at last visit w/ elevated trigs and started on Fenofibrate.  No abd pain, N/V.  Join pains- saw rheum who felt ANA was nonspecific and felt that possibly pain is due to fibro but didn't find trigger points particularly sensitive.  Recommended possible sleep study to assess for sleep apnea.  Pt refusing sleep study- 'i won't fall asleep w/ someone watching me and i won't wear a CPAP mask'.  Submandibular glands- reports pain, dry mouth, increased pain/cracking of jaw along w/ malocclusion of teeth  Obesity- pt has already lost 50-100 lbs.  Is interested in continued weight loss but not surgery.  Limited activity due to stress on joints.  Is considering juice fast vs other options.  Physical Activity: walking regularly Fall risk: low Depression: chronic problem, following w/ therapist Hearing: normal to conversational tones and whispered voice ADL's: independent Cognitive: normal linear thought process, memory and attention intact Home Safety: safe at home, lives w/ daughter Height, Weight, BMI, Visual Acuity: see vitals, vision corrected to 20/20 w/ glasses Counseling: too young for mammo, colonoscopy.  Pap today. Labs Ordered: See A&P Care Plan: See A&P    Review of Systems Patient reports no vision/ hearing changes, adenopathy, fever, weight change,  persistant/recurrent hoarseness, swallowing issues, chest pain, palpitations, edema, persistant/recurrent cough, hemoptysis, dyspnea (rest/exertional/paroxysmal nocturnal), gastrointestinal bleeding (melena, rectal bleeding), abdominal pain, significant heartburn, bowel changes, GU symptoms (dysuria, hematuria, incontinence), Gyn symptoms (abnormal  bleeding, pain),  syncope, focal weakness, memory loss, numbness & tingling, skin/hair/nail changes, abnormal bruising or bleeding.      Objective:   Physical Exam  General Appearance:    Alert, cooperative, no distress, appears stated age, morbidly obese  Head:    Normocephalic, without obvious abnormality, atraumatic  Eyes:    PERRL, conjunctiva/corneas clear, EOM's intact, fundi    benign, both eyes  Ears:    Normal TM's and external ear canals, both ears  Nose:   Nares normal, septum midline, mucosa normal, no drainage    or sinus tenderness  Throat:   Lips, mucosa, and tongue normal; teeth and gums normal  Neck:   Supple, symmetrical, trachea midline, no adenopathy, mild enlargement of submandibular glands bilaterally;    Thyroid: no enlargement/tenderness/nodules  Back:     Symmetric, no curvature, ROM normal, no CVA tenderness  Lungs:     Clear to auscultation bilaterally, respirations unlabored  Chest Wall:    No tenderness or deformity   Heart:    Regular rate and rhythm, S1 and S2 normal, no murmur, rub   or gallop  Breast Exam:    No tenderness, masses, or nipple abnormality  Abdomen:     Soft, non-tender, bowel sounds active all four quadrants,    no masses, no organomegaly  Genitalia:    External genitalia normal, cervix normal in appearance, no CMT, uterus in normal size and position, adnexa w/out mass or tenderness, mucosa pink and moist, no lesions or discharge present  Rectal:    Normal external appearance  Extremities:   Extremities normal, atraumatic, no cyanosis or edema  Pulses:   2+ and symmetric all extremities  Skin:   Skin color, texture, turgor normal, no rashes or lesions  Lymph nodes:   Cervical, supraclavicular, and axillary nodes normal  Neurologic:   CNII-XII intact, normal strength, sensation and reflexes  throughout          Assessment & Plan:

## 2012-02-26 NOTE — Assessment & Plan Note (Signed)
Pt's PE WNL w/ exception of morbid obesity.  Reviewed labs from previous visit.  Anticipatory guidance provided.

## 2012-02-26 NOTE — Assessment & Plan Note (Signed)
Pap collected. 

## 2012-02-26 NOTE — Patient Instructions (Addendum)
Follow up in 3 months to check weight loss progress It is ok to start juicing but make sure you do it smartly- read about the various kinds, choose a program that still provides protein for energy Consider Michaela Cornfield Do or aqua exercise Your jaw pain is due to grinding your teeth- consider OTC bite guard, ibuprofen as needed We'll notify you of your lab results Call with any questions or concerns Hang in there!!!

## 2012-02-26 NOTE — Assessment & Plan Note (Signed)
Pt had appt w/ rheum who felt low level ANA titer was nonspecific and didn't feel there was any rheumatologic cause for pain.  Suspect that pt's migratory joint pain is due to her excessive weight- pt is in agreement w/ this and is actively trying to lose weight.  Will follow closely.

## 2012-02-26 NOTE — Assessment & Plan Note (Signed)
Chronic problem, pt has already lost 50-100 lbs by changing diet.  Is limited in exercise due to her size and joint pains.  Is considering following a juicing diet.  Discussed the safety of this and the need for protein.  Encouraged her to start Judeth Cornfield Do which she has expressed interest in or begin aqua exercise to take stress off joints.  Will follow closely.

## 2012-02-26 NOTE — Assessment & Plan Note (Signed)
Pt started on fenofibrate at last visit.  Tolerating med w/out difficulty.  Attempting to lose weight.  Check labs.  Adjust meds prn

## 2012-02-27 ENCOUNTER — Encounter: Payer: Self-pay | Admitting: *Deleted

## 2012-03-02 ENCOUNTER — Ambulatory Visit (INDEPENDENT_AMBULATORY_CARE_PROVIDER_SITE_OTHER): Payer: Medicare Other | Admitting: Licensed Clinical Social Worker

## 2012-03-02 DIAGNOSIS — F319 Bipolar disorder, unspecified: Secondary | ICD-10-CM | POA: Diagnosis not present

## 2012-03-02 NOTE — Progress Notes (Signed)
   THERAPIST PROGRESS NOTE  Session Time: 2:00pm-2:50pm  Participation Level: Active  Behavioral Response: Well GroomedAlertEuthymic  Type of Therapy: Individual Therapy  Treatment Goals addressed: Coping  Interventions: CBT, Motivational Interviewing, Supportive and Reframing  Summary: Konstance Davern is a 36 y.o. female who presents with euthymic mood and affect. She is doing well, with decreased anxiety and she is trying to focus on her doctor telling her that her health is okay in order to reign in any hypochondriac thoughts. She is focused on her health and is currently doing a juice fast which she finds helpful. She is disinterested in school and is doing just enough to get by. She continues to focus on developing self knowledge and finding a place to "fit in" in the world. She is spending too much time with her mother and realizes that she needs to set some firmer boundaries to manage her anxiety. Her sleep and appetite are wnl.   Suicidal/Homicidal: Nowithout intent/plan  Therapist Response: Assessed patients current functioning and reviewed progress. Reviewed coping strategies. Assessed patients safety and assisted in identifying protective factors.  Reviewed crisis plan with patient. Assisted patient with the expression of her feelings of frustration with her mother. Used CBT to assist patient with the identification of negative distortions and irrational thoughts. Encouraged patient to verbalize alternative and factual responses which challenge thought distortions. Reviewed patients self care plan. Assessed  progress related to self care. Patient's self care is good. Recommend proper diet, regular exercise, socialization and recreation. Reviewed healthy boundaries and assertive communication.   Plan: Return again in one weeks.  Diagnosis: Axis I: bi polar    Axis II: No diagnosis    Saida Lonon, LCSW 03/02/2012

## 2012-03-10 ENCOUNTER — Ambulatory Visit (INDEPENDENT_AMBULATORY_CARE_PROVIDER_SITE_OTHER): Payer: Medicare Other | Admitting: Licensed Clinical Social Worker

## 2012-03-10 DIAGNOSIS — F319 Bipolar disorder, unspecified: Secondary | ICD-10-CM

## 2012-03-10 NOTE — Progress Notes (Signed)
   THERAPIST PROGRESS NOTE  Session Time: 1:00pm-1:50pm  Participation Level: Active  Behavioral Response: Well GroomedAlertEuthymic  Type of Therapy: Individual Therapy  Treatment Goals addressed: Coping  Interventions: CBT, Strength-based, Supportive and Reframing  Summary: Michaela Norris is a 36 y.o. female who presents with euthymic mood and bright affect. She reports doing well and is currently on Fall Break. She is under some stress related to her course work in that she is asked to share personal things about herself and she is uncomfortable doing this. She processes her difficulty with grief and how she feels guilt that her grandmother thought patient was angry with her when she died. Patient explores her regret and her inability to fix this. She continues to struggle with social cues and interpretation of how other communicate. She feels she "copies" how others act as a means to quickly "get away" from feeling anxious in a social situation. While she wants to learn how to manage social anxiety better, she feels unable to tolerate this distress to build skill. Her sleep and appetite are wnl. Her AH are well managed and at baseline and she denies any VH or parnaoia.    Suicidal/Homicidal: Nowithout intent/plan  Therapist Response: Assessed patients current functioning and reviewed progress. Reviewed coping strategies. Assessed patients safety and assisted in identifying protective factors.  Reviewed crisis plan with patient. Assisted patient with the expression of her feelings of frustration regarding her inability to interpret social cues. Used CBT to assist patient with the identification of negative distortions and irrational thoughts. Encouraged patient to verbalize alternative and factual responses which challenge thought distortions. Used DBT to practice mindfulness, review distraction list and improve distress tolerance skills. Reviewed patients self care plan. Assessed  progress  related to self care. Patient's self care is good. Recommend proper diet, regular exercise, socialization and recreation.   Plan: Return again in one weeks.  Diagnosis: Axis I: bi polar    Axis II: No diagnosis    Jaeven Wanzer, LCSW 03/10/2012

## 2012-03-17 ENCOUNTER — Ambulatory Visit (INDEPENDENT_AMBULATORY_CARE_PROVIDER_SITE_OTHER): Payer: Medicare Other | Admitting: Licensed Clinical Social Worker

## 2012-03-17 DIAGNOSIS — F319 Bipolar disorder, unspecified: Secondary | ICD-10-CM | POA: Diagnosis not present

## 2012-03-17 NOTE — Progress Notes (Signed)
   THERAPIST PROGRESS NOTE  Session Time: 2:00pm-2:50pm  Participation Level: Active  Behavioral Response: Well GroomedAlertEuthymic  Type of Therapy: Individual Therapy  Treatment Goals addressed: Coping  Interventions: CBT, Strength-based, Supportive and Reframing  Summary: Kamarra Sirmon is a 36 y.o. female who presents with euthymic mood and bright affect. She reports feeling tired after staying up to 5am working on a school project, which she was pleased with. She had to present to her class for thirty minutes and was surprised that she was able to fill all this time. She is feeling better about school and is taking this semester in stride. She has lowered her expectations of herself and feels decreased anxiety. She is focused on the relationship with her daughter and notices improvement. She processes her desire to become a foster parent as she discuses her frustration with her mother and how she attempts to use patient to meet her emotional needs. Patients overall health is well. Her sleep and appetite are wnl.    Suicidal/Homicidal: Nowithout intent/plan  Therapist Response: Assessed patients current functioning and reviewed progress. Reviewed coping strategies. Assessed patients safety and assisted in identifying protective factors.  Reviewed crisis plan with patient. Assisted patient with the expression of her feelings of frustration with her motehr. Processed and normalized her grief and anger with her mother. Used CBT to assist patient with the identification of negative distortions and irrational thoughts. Encouraged patient to verbalize alternative and factual responses which challenge thought distortions. Used DBT to practice mindfulness, review distraction list and improve distress tolerance skills. Reviewed patients self care plan. Assessed  progress related to self care. Patient's self care is good. Recommend proper diet, regular exercise, socialization and recreation.   Plan:  Return again in one weeks.  Diagnosis: Axis I: bi polar disorder    Axis II: No diagnosis    Jasia Hiltunen, LCSW 03/17/2012

## 2012-04-01 ENCOUNTER — Ambulatory Visit (INDEPENDENT_AMBULATORY_CARE_PROVIDER_SITE_OTHER): Payer: Medicare Other | Admitting: Licensed Clinical Social Worker

## 2012-04-01 ENCOUNTER — Encounter (HOSPITAL_COMMUNITY): Payer: Self-pay | Admitting: Licensed Clinical Social Worker

## 2012-04-01 DIAGNOSIS — F319 Bipolar disorder, unspecified: Secondary | ICD-10-CM
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
## 2012-04-08 ENCOUNTER — Ambulatory Visit (INDEPENDENT_AMBULATORY_CARE_PROVIDER_SITE_OTHER): Payer: Medicare Other | Admitting: Licensed Clinical Social Worker

## 2012-04-08 DIAGNOSIS — F319 Bipolar disorder, unspecified: Secondary | ICD-10-CM

## 2012-04-08 NOTE — Progress Notes (Signed)
   THERAPIST PROGRESS NOTE  Session Time: 2:00pm-2:50pm  Participation Level: Active  Behavioral Response: Well GroomedAlertAnxious  Type of Therapy: Individual Therapy  Treatment Goals addressed: Coping  Interventions: CBT, Strength-based, Supportive and Reframing  Summary: Michaela Norris is a 36 y.o. female who presents with anxious mood and affect. She reports increased stress at school related to social interaction and her difficulty reading social cues. She endorses feeling different and uncomfortable in this difference especially at school because she does not know how to navigate the politics of academia. She reports feeling mildly traumatized every day when she interacts with the public and she believes that there is something everyone else knows that they won't tell her. She has difficulty accepting her difference as difference and not pathology. Her sleep and appetite are wnl.    Suicidal/Homicidal: Nowithout intent/plan  Therapist Response: Assessed patients current functioning and reviewed progress. Reviewed coping strategies. Assessed patients safety and assisted in identifying protective factors.  Reviewed crisis plan with patient. Assisted patient with the expression of her feelings of anxiety. Used CBT to assist patient with the identification of negative distortions and irrational thoughts. Encouraged patient to verbalize alternative and factual responses which challenge thought distortions. Her thinking can be negative and distorted at times. Used DBT to practice mindfulness, review distraction list and improve distress tolerance skills. Discussed ways of improving mindfulness in her social interactions. Reviewed patients self care plan. Assessed  progress related to self care. Patient's self care is good. Recommend proper diet, regular exercise, socialization and recreation.   Plan: Return again in one weeks.  Diagnosis: Axis I: bi polar     Axis II: No  diagnosis    Detrice Cales, LCSW 04/08/2012

## 2012-04-15 ENCOUNTER — Ambulatory Visit (INDEPENDENT_AMBULATORY_CARE_PROVIDER_SITE_OTHER): Payer: Medicare Other | Admitting: Licensed Clinical Social Worker

## 2012-04-15 DIAGNOSIS — F319 Bipolar disorder, unspecified: Secondary | ICD-10-CM

## 2012-04-15 NOTE — Progress Notes (Signed)
   THERAPIST PROGRESS NOTE  Session Time: 2:00pm-2:50pm  Participation Level: Active  Behavioral Response: Well GroomedAlertEuthymic  Type of Therapy: Individual Therapy  Treatment Goals addressed: Coping  Interventions: CBT, Solution Focused, Supportive and Reframing  Summary: Shanaya Schneck is a 36 y.o. female who presents with euthymic mood and bright affect. she is doing well and focused on finishing out the semester. She is disappointed with school and the graduate program. She feels she is not being challenged and not learning much which is new. She is fearful and anxious about the upcoming holidays. She is concerned about the noise and stimuli she will experience at her parents home. She endorses feelings of paranoia that her apartment will be broken into and her Christmas gifts will be stolen. She does not want to bring them to her parents because she doesn't trust them. Her sleep and appetite are wnl.  Suicidal/Homicidal: Nowithout intent/plan  Therapist Response: Assessed patients current functioning and reviewed progress. Reviewed coping strategies. Assessed patients safety and assisted in identifying protective factors.  Reviewed crisis plan with patient. Assisted patient with the expression of her feelings of anxiety. Assisted patient with solutions to decrease her paranoia. Used CBT to assist patient with the identification of negative distortions and irrational thoughts. Encouraged patient to verbalize alternative and factual responses which challenge thought distortions. Used DBT to practice mindfulness, review distraction list and improve distress tolerance skills. Reviewed patients self care plan. Assessed  progress related to self care. Patient's self care is good. Recommend proper diet, regular exercise, socialization and recreation.   Plan: Return again in one weeks.  Diagnosis: Axis I: bi polar    Axis II: No diagnosis    Andrej Spagnoli, LCSW 04/15/2012

## 2012-04-22 ENCOUNTER — Ambulatory Visit (INDEPENDENT_AMBULATORY_CARE_PROVIDER_SITE_OTHER): Payer: Medicare Other | Admitting: Licensed Clinical Social Worker

## 2012-04-22 ENCOUNTER — Encounter (HOSPITAL_COMMUNITY): Payer: Self-pay | Admitting: Licensed Clinical Social Worker

## 2012-04-22 DIAGNOSIS — F319 Bipolar disorder, unspecified: Secondary | ICD-10-CM
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
## 2012-05-04 ENCOUNTER — Ambulatory Visit (INDEPENDENT_AMBULATORY_CARE_PROVIDER_SITE_OTHER): Payer: Medicare Other | Admitting: Licensed Clinical Social Worker

## 2012-05-04 DIAGNOSIS — F319 Bipolar disorder, unspecified: Secondary | ICD-10-CM | POA: Diagnosis not present

## 2012-05-04 NOTE — Progress Notes (Signed)
   THERAPIST PROGRESS NOTE  Session Time: 4:00pm-4:50pm  Participation Level: Active  Behavioral Response: Well GroomedAlertEuthymic  Type of Therapy: Individual Therapy  Treatment Goals addressed: Coping  Interventions: Strength-based, Supportive and Reframing  Summary: Michaela Norris is a 36 y.o. female who presents with euthymic mood and bright affect. She is finished with her semester and is relieved. She was able to take care of herself on Thanksgiving and did not spend much time at her mothers home. She plans to do the same for Christmas. She feels improvement in her ability to understand and be patient with her daughter. She feels for the first time that she is able to see her daughters Asperger's behavior. She is concerned about her finances and anxious that she is going to have to apply for disability for her daughter. Overall, patient is doing well. Sleeping and eating well.    Suicidal/Homicidal: Nowithout intent/plan  Therapist Response: Assessed patients current functioning and reviewed progress. Reviewed coping strategies. Assessed patients safety and assisted in identifying protective factors.  Reviewed crisis plan with patient. Assisted patient with the expression of her feelings of frustration with her family and the chaos at her mothers home. Used DBT to practice mindfulness, review distraction list and improve distress tolerance skills. Used motivational interviewing to assist and encourage patient through the change process. Explored patients barriers to change. Reviewed patients self care plan. Assessed  progress related to self care. Patient's self care is good. Recommend proper diet, regular exercise, socialization and recreation.   Plan: Return again in one weeks.  Diagnosis: Axis I: bi polar    Axis II: No diagnosis    Michaela Manzano, LCSW 05/04/2012

## 2012-05-11 ENCOUNTER — Ambulatory Visit (INDEPENDENT_AMBULATORY_CARE_PROVIDER_SITE_OTHER): Payer: Medicare Other | Admitting: Licensed Clinical Social Worker

## 2012-05-11 ENCOUNTER — Encounter (HOSPITAL_COMMUNITY): Payer: Self-pay | Admitting: Licensed Clinical Social Worker

## 2012-05-11 DIAGNOSIS — F259 Schizoaffective disorder, unspecified: Secondary | ICD-10-CM | POA: Diagnosis not present

## 2012-05-11 NOTE — Progress Notes (Signed)
   THERAPIST PROGRESS NOTE  Session Time: 2:00pm-2:50pm  Participation Level: Active  Behavioral Response: Well GroomedAlertEuthymic  Type of Therapy: Individual Therapy  Treatment Goals addressed: Coping  Interventions: Solution Focused, Strength-based, Supportive and Reframing  Summary: Michaela Norris is a 36 y.o. female who presents with euthymic mood and bright affect. She is very excited after reading a theory about Autism which she feels perfectly describes her. She discusses her belief that her sensory integration problems as a child have interfered with her ability to learn how to adapt socially and develop appropriate social skills. And she believes that despite what others say, she does have deep empathy and feelings, she just struggles to communicate these. She is enjoying her time off and has been spending some time at her parents home. She does endorse concern about managing her senses on Christmas and explores strategies she can use. She is getting along with her daughter well and overall is doing very well.     Suicidal/Homicidal: Nowithout intent/plan  Therapist Response: Assessed patients current functioning and reviewed progress. Reviewed coping strategies. Assessed patients safety and assisted in identifying protective factors.  Reviewed crisis plan with patient. Assisted patient with the expression of her feelings of anxiety over Christmas. Reviewed healthy boundaries and assertive communication to use with her family. Used DBT to practice mindfulness, review distraction list and improve distress tolerance skills. Reviewed patients self care plan. Assessed  progress related to self care. Patient's self care is good. Recommend proper diet, regular exercise, socialization and recreation.   Plan: Return again in one to two weeks.  Diagnosis: Axis I: Schizoaffective Disorder    Axis II: No diagnosis    Zoeya Gramajo, LCSW 05/11/2012

## 2012-05-25 ENCOUNTER — Ambulatory Visit (INDEPENDENT_AMBULATORY_CARE_PROVIDER_SITE_OTHER): Payer: Medicare Other | Admitting: Licensed Clinical Social Worker

## 2012-05-25 ENCOUNTER — Encounter (HOSPITAL_COMMUNITY): Payer: Self-pay | Admitting: Licensed Clinical Social Worker

## 2012-05-25 DIAGNOSIS — F259 Schizoaffective disorder, unspecified: Secondary | ICD-10-CM | POA: Diagnosis not present
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
## 2012-05-29 ENCOUNTER — Ambulatory Visit: Payer: Self-pay | Admitting: Family Medicine

## 2012-06-01 ENCOUNTER — Ambulatory Visit (HOSPITAL_COMMUNITY): Payer: Self-pay | Admitting: Licensed Clinical Social Worker

## 2012-06-08 ENCOUNTER — Ambulatory Visit (INDEPENDENT_AMBULATORY_CARE_PROVIDER_SITE_OTHER): Payer: Medicare Other | Admitting: Licensed Clinical Social Worker

## 2012-06-08 DIAGNOSIS — F259 Schizoaffective disorder, unspecified: Secondary | ICD-10-CM | POA: Diagnosis not present

## 2012-06-08 NOTE — Progress Notes (Signed)
   THERAPIST PROGRESS NOTE  Session Time: 11:30am-12:20pm  Participation Level: Active  Behavioral Response: Well GroomedAlertEuthymic  Type of Therapy: Individual Therapy  Treatment Goals addressed: Coping  Interventions: CBT, Motivational Interviewing, Strength-based, Supportive and Reframing  Summary: Michaela Norris is a 37 y.o. female who presents with euthymic mood and affect. She reports feeling unmotivated about school and does not want to participate in this semester. She feels she is being forced to take classes she is not interested in and is generally disappointed with her graduate school experience. She continues to spend a lot of time focusing and researching her own learning style, personality type and differences in an effort to understand herself better. What she finds is that this information makes her feel even more excluded and different and she shies away from interaction even further. She remains focused on her health and wellness and right now enjoys cooking for herself as a primary tool to reduce stress. Her sleep and appetite are wnl.    Suicidal/Homicidal: Nowithout intent/plan  Therapist Response: Assessed patients current functioning and reviewed progress. Reviewed coping strategies. Assessed patients safety and assisted in identifying protective factors.  Reviewed crisis plan with patient. Assisted patient with the expression of her feelings of frustration. Used CBT to assist patient with the identification of negative distortions and irrational thoughts. Encouraged patient to verbalize alternative and factual responses which challenge thought distortions. Used DBT to practice mindfulness, review distraction list and improve distress tolerance skills. Reviewed patients self care plan. Assessed  progress related to self care. Patient's self care is good. Recommend proper diet, regular exercise, socialization and recreation. Used motivational interviewing to assist and  encourage patient through the change process. Explored patients barriers to change.   Plan: Return again in one weeks.  Diagnosis: Axis I: Schizoaffective Disorder    Axis II: No diagnosis    Steffon Gladu, LCSW 06/08/2012

## 2012-06-15 ENCOUNTER — Encounter (HOSPITAL_COMMUNITY): Payer: Self-pay | Admitting: Licensed Clinical Social Worker

## 2012-06-15 ENCOUNTER — Ambulatory Visit (INDEPENDENT_AMBULATORY_CARE_PROVIDER_SITE_OTHER): Payer: Medicare Other | Admitting: Licensed Clinical Social Worker

## 2012-06-15 DIAGNOSIS — F259 Schizoaffective disorder, unspecified: Secondary | ICD-10-CM | POA: Diagnosis not present

## 2012-06-15 NOTE — Progress Notes (Signed)
   THERAPIST PROGRESS NOTE  Session Time: 2:00pm-2:50pm  Participation Level: Active  Behavioral Response: Well GroomedAlertEuthymic  Type of Therapy: Individual Therapy  Treatment Goals addressed: Coping  Interventions: CBT, Strength-based, Supportive and Reframing  Summary: Michaela Norris is a 37 y.o. female who presents with euthymic mood and bright affect. She reports doing well and has started the winter semester. She discusses her graduate classes and her concern about one of them. She is trying hard to maintain motivation to see her through the semester. She remains focused on her belief that she has hyperlexia and is trying to understand how this affects her negatively so she can work towards problem solving. She endorses continues racing thoughts at night and does not endorse restful sleep.   Suicidal/Homicidal: Nowithout intent/plan  Therapist Response: Assessed patients current functioning and reviewed progress. Reviewed coping strategies. Assessed patients safety and assisted in identifying protective factors.  Reviewed crisis plan with patient. Assisted patient with the expression of her feelings of frustration. Used DBT to practice mindfulness, review distraction list and improve distress tolerance skills. Reviewed patients self care plan. Assessed  progress related to self care. Patient's self care is good. Recommend proper diet, regular exercise, socialization and recreation. Patient remains highly motivated for change.   Plan: Return again in one weeks.  Diagnosis: Axis I: Schizoaffective Disorder    Axis II: No diagnosis    Sinead Hockman, LCSW 06/15/2012

## 2012-06-29 ENCOUNTER — Ambulatory Visit (INDEPENDENT_AMBULATORY_CARE_PROVIDER_SITE_OTHER): Payer: Medicare Other | Admitting: Licensed Clinical Social Worker

## 2012-06-29 DIAGNOSIS — F259 Schizoaffective disorder, unspecified: Secondary | ICD-10-CM | POA: Diagnosis not present

## 2012-06-29 NOTE — Progress Notes (Signed)
   THERAPIST PROGRESS NOTE  Session Time: 4:00pm-4:50pm  Participation Level: Active  Behavioral Response: Well GroomedAlertEuthymic  Type of Therapy: Individual Therapy  Treatment Goals addressed: Coping  Interventions: CBT, Solution Focused, Strength-based, Supportive and Reframing  Summary: Michaela Norris is a 37 y.o. female who presents with euthymic mood and bright affect. She is excited to share her decision to relocate to [place] sooner than she had originally planned.  She realizes that she has been putting her life on hold for her daughter and her family and has come to accept that she is capable of relocating and being happy. She processes her resistance in accepting her mothers responsibility in creating dysfunction in patients life. She will begin planning to move. Currently she struggles with school, but is able to get up and force herself to attend. Her sleep and appetite are wnl.   Suicidal/Homicidal: Nowithout intent/plan  Therapist Response: Assessed patients current functioning and reviewed progress. Reviewed coping strategies. Assessed patients safety and assisted in identifying protective factors.  Reviewed crisis plan with patient. Assisted patient with the expression of her feelings of frustration with her family. Used CBT to assist patient with the identification of negative distortions and irrational thoughts. Encouraged patient to verbalize alternative and factual responses which challenge thought distortions. Used DBT to practice mindfulness, review distraction list and improve distress tolerance skills. Reviewed patients self care plan. Assessed  progress related to self care. Patient's self care is good. Recommend proper diet, regular exercise, socialization and recreation.   Plan: Return again in one weeks.  Diagnosis: Axis I: Schizoaffective Disorder    Axis II: No diagnosis    Zadin Lange, LCSW 06/29/2012

## 2012-07-06 ENCOUNTER — Ambulatory Visit (INDEPENDENT_AMBULATORY_CARE_PROVIDER_SITE_OTHER): Payer: Medicare Other | Admitting: Licensed Clinical Social Worker

## 2012-07-06 DIAGNOSIS — F259 Schizoaffective disorder, unspecified: Secondary | ICD-10-CM

## 2012-07-06 NOTE — Progress Notes (Signed)
   THERAPIST PROGRESS NOTE  Session Time: 3:00pm-3:50pm  Participation Level: Active  Behavioral Response: Well GroomedAlertAnxious, Depressed and Irritable  Type of Therapy: Individual Therapy  Treatment Goals addressed: Coping  Interventions: CBT, Solution Focused, Strength-based, Supportive and Reframing  Summary: Michaela Norris is a 37 y.o. female who presents with sad mood and frustrated affect. She reports getting her feelings hurt by her family after learning that they have planned a trip to [place] without inviting her and her daughter. She processes her initial anger which has been followed by deep sadness. She believes that either her family doesn't know her and how much she loves [employer] or they just don't care. She feels that this was positive confirmation she needs in order to leave and move to [place]. She endorses feelings of rejection and is beginning to fully accept and understand that her family is not good for her and her daughter. Her sleep is disrupted by panic and anxiety and her appetite is wnl.    Suicidal/Homicidal: Nowithout intent/plan  Therapist Response: Assessed patients current functioning and reviewed progress. Reviewed coping strategies. Assessed patients safety and assisted in identifying protective factors.  Reviewed crisis plan with patient. Assisted patient with the expression of her feelings of anger and sadness. Reviewed healthy boundaries and assertive communication. Used CBT to assist patient with the identification of negative distortions and irrational thoughts. Encouraged patient to verbalize alternative and factual responses which challenge thought distortions. Reviewed patients self care plan. Assessed  progress related to self care. Patient's self care is good. Recommend proper diet, regular exercise, socialization and recreation.   Plan: Return again in one weeks.  Diagnosis: Axis I: Schizoaffective Disorder    Axis II: No  diagnosis    Emeree Mahler, LCSW 07/06/2012

## 2012-07-11 ENCOUNTER — Other Ambulatory Visit: Payer: Self-pay

## 2012-07-13 ENCOUNTER — Ambulatory Visit (HOSPITAL_COMMUNITY): Payer: Self-pay | Admitting: Licensed Clinical Social Worker

## 2012-07-20 ENCOUNTER — Ambulatory Visit (INDEPENDENT_AMBULATORY_CARE_PROVIDER_SITE_OTHER): Payer: Medicare Other | Admitting: Licensed Clinical Social Worker

## 2012-07-20 DIAGNOSIS — F319 Bipolar disorder, unspecified: Secondary | ICD-10-CM | POA: Diagnosis not present

## 2012-07-20 NOTE — Progress Notes (Signed)
   THERAPIST PROGRESS NOTE  Session Time: 2:00pm-2:50pm  Participation Level: Active  Behavioral Response: Well GroomedAlertAnxious  Type of Therapy: Individual Therapy  Treatment Goals addressed: Coping  Interventions: CBT, Solution Focused, Strength-based, Supportive and Reframing  Summary: Michaela Norris is a 37 y.o. female who presents with anxious mood and bright affect. She continues to prepare to relocate to [place] and is surprised by how quickly things are moving. She has learned that she will most likely relocate in May or June. She is not receiving support from her family as she expected and is frustrated by their negativity. Overall, she is doing very well. 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 She remains focused on her differences in learning styles and her inability to visualize something when she reads it.    Suicidal/Homicidal: Nowithout intent/plan  Therapist Response: Assessed patients current functioning and reviewed progress. Reviewed coping strategies. Assessed patients safety and assisted in identifying protective factors.  Reviewed crisis plan with patient. Assisted patient with the expression of her feelings of stress about moving. Used CBT to assist patient with the identification of negative distortions and irrational thoughts. Encouraged patient to verbalize alternative and factual responses which challenge thought distortions. Reviewed patients self care plan. Assessed  progress related to self care. Patient's self care is good. Recommend proper diet, regular exercise, socialization and recreation.   Plan: Return again in one weeks.  Diagnosis: Axis I: bi polar    Axis II: No diagnosis    Cataleia Gade, LCSW 07/20/2012

## 2012-07-27 ENCOUNTER — Ambulatory Visit (HOSPITAL_COMMUNITY): Payer: Self-pay | Admitting: Licensed Clinical Social Worker

## 2012-07-29 ENCOUNTER — Ambulatory Visit (INDEPENDENT_AMBULATORY_CARE_PROVIDER_SITE_OTHER): Payer: Medicare Other | Admitting: Licensed Clinical Social Worker

## 2012-07-29 DIAGNOSIS — F319 Bipolar disorder, unspecified: Secondary | ICD-10-CM | POA: Diagnosis not present
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
## 2012-08-03 ENCOUNTER — Ambulatory Visit (INDEPENDENT_AMBULATORY_CARE_PROVIDER_SITE_OTHER): Payer: Medicare Other | Admitting: Licensed Clinical Social Worker

## 2012-08-03 DIAGNOSIS — F319 Bipolar disorder, unspecified: Secondary | ICD-10-CM | POA: Diagnosis not present

## 2012-08-03 NOTE — Progress Notes (Signed)
   THERAPIST PROGRESS NOTE  Session Time: 1:00pm-1:50pm  Participation Level: Active  Behavioral Response: Well GroomedAlertAnxious and Euthymic  Type of Therapy: Individual Therapy  Treatment Goals addressed: Anxiety and Coping  Interventions: CBT, Motivational Interviewing, Strength-based, Supportive and Reframing  Summary: Michaela Norris is a 37 y.o. female who presents with euthymic mood and bright affect. She reports ongoing anxiety about her move to [place] and is frustrated that she has not heard anything from the apartment complex. She is trying to be "zen" about this process but admits that is just not her temperament. She is unhappy in school and just tolerating it until the semester is over. She explores her frustration with her family and how they have tried to convince her that she is someone else her entire life. She views her education as a study of self exploration. She is learning to accept her limits in a new way, without judgment and condemnation. Her sleep and appetite are wnl.    Suicidal/Homicidal: Nowithout intent/plan  Therapist Response: Assessed patients current functioning and reviewed progress. Reviewed coping strategies. Assessed patients safety and assisted in identifying protective factors.  Reviewed crisis plan with patient. Assisted patient with the expression of frustration with her family. Reviewed patients self care plan. Assessed progress related to self care. Patients self care is good, but she plans to exercise more. Recommend daily exercise, increased socialization and recreation. Used CBT to assist patient with the identification of negative distortions and irrational thoughts. Encouraged patient to verbalize alternative and factual responses which challenge thought distortions. Used motivational interviewing to assist and encourage patient through the change process. Explored patients barriers to change.   Plan: Return again in one  weeks.  Diagnosis: Axis I: bi polar    Axis II: No diagnosis    NORDEN,KRISTIN, LCSW 08/03/2012

## 2012-08-13 ENCOUNTER — Ambulatory Visit (INDEPENDENT_AMBULATORY_CARE_PROVIDER_SITE_OTHER): Payer: Medicare Other | Admitting: Licensed Clinical Social Worker

## 2012-08-13 DIAGNOSIS — F319 Bipolar disorder, unspecified: Secondary | ICD-10-CM | POA: Diagnosis not present

## 2012-08-13 NOTE — Progress Notes (Signed)
   THERAPIST PROGRESS NOTE  Session Time: 1:00pm-1:50pm  Participation Level: Active  Behavioral Response: Well GroomedAlertAnxious  Type of Therapy: Individual Therapy  Treatment Goals addressed: Communication: with family and Coping  Interventions: CBT, DBT, Motivational Interviewing, Strength-based, Supportive and Reframing  Summary: Michaela Norris is a 37 y.o. female who presents with anxious mood and affect. She reports that her daughter has gone out of town for a tournament and she endorses high anxiety over this. This is the first time she and her daughter have been separated and she is noticing how much she depends upon her daughter. She is texting her often with direction about social situations. She is worried that her daughter will act inappropriately and will shut down and not participate. She is also fearful of being alone and has scheduled time with friends. She hates school and is doing the bare minimum. She has decided to delay moving to [place] until September. Her sleep and appetite are wnl.   Suicidal/Homicidal: Nowithout intent/plan  Therapist Response: Assessed patients current functioning and reviewed progress. Reviewed coping strategies. Assessed patients safety and assisted in identifying protective factors.  Reviewed crisis plan with patient. Assisted patient with the expression of anxiety. Reviewed patients self care plan. Assessed progress related to self care. Patients self care is good. Recommend daily exercise, increased socialization and recreation. Used CBT to assist patient with the identification of negative distortions and irrational thoughts. Encouraged patient to verbalize alternative and factual responses which challenge thought distortions. Used DBT to practice mindfulness, review distraction list and improve distress tolerance skills. Used motivational interviewing to assist and encourage patient through the change process. Explored patients barriers to  change.   Plan: Return again in one weeks.  Diagnosis: Axis I: bi polar    Axis II: No diagnosis    Zeph Riebel, LCSW 08/13/2012

## 2012-08-20 ENCOUNTER — Ambulatory Visit (HOSPITAL_COMMUNITY): Payer: Self-pay | Admitting: Licensed Clinical Social Worker

## 2012-08-21 ENCOUNTER — Ambulatory Visit (INDEPENDENT_AMBULATORY_CARE_PROVIDER_SITE_OTHER): Payer: Medicare Other | Admitting: Licensed Clinical Social Worker

## 2012-08-21 DIAGNOSIS — F319 Bipolar disorder, unspecified: Secondary | ICD-10-CM | POA: Diagnosis not present
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
## 2012-08-27 ENCOUNTER — Ambulatory Visit (HOSPITAL_COMMUNITY): Payer: Self-pay | Admitting: Licensed Clinical Social Worker

## 2012-09-01 ENCOUNTER — Ambulatory Visit (INDEPENDENT_AMBULATORY_CARE_PROVIDER_SITE_OTHER): Payer: Medicare Other | Admitting: Licensed Clinical Social Worker

## 2012-09-01 DIAGNOSIS — F319 Bipolar disorder, unspecified: Secondary | ICD-10-CM
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
## 2012-09-03 ENCOUNTER — Ambulatory Visit (HOSPITAL_COMMUNITY): Payer: Self-pay | Admitting: Licensed Clinical Social Worker

## 2012-09-03 ENCOUNTER — Telehealth (HOSPITAL_COMMUNITY): Payer: Self-pay

## 2012-09-04 ENCOUNTER — Telehealth (HOSPITAL_COMMUNITY): Payer: Self-pay | Admitting: Licensed Clinical Social Worker

## 2012-09-04 NOTE — Telephone Encounter (Signed)
Called patient back. No answer. Left message.

## 2012-09-08 ENCOUNTER — Ambulatory Visit (INDEPENDENT_AMBULATORY_CARE_PROVIDER_SITE_OTHER): Payer: Medicare Other | Admitting: Licensed Clinical Social Worker

## 2012-09-08 DIAGNOSIS — F319 Bipolar disorder, unspecified: Secondary | ICD-10-CM | POA: Diagnosis not present

## 2012-09-08 NOTE — Progress Notes (Signed)
   THERAPIST PROGRESS NOTE  Session Time: 2:00pm-2:50pm  Participation Level: Active  Behavioral Response: Well GroomedAlertAnxious and Depressed  Type of Therapy: Individual Therapy  Treatment Goals addressed: Coping  Interventions: CBT, Strength-based, Supportive, Reframing and Other: grief and loss  Summary: Michaela Norris is a 37 y.o. female who presents with anxious mood and tearful affect. She reports having the worst week of her life, after putting her dog to sleep, her parents dog died and her nephew's appendix burst. She processes her anger with her family for what she believes is neglectful parenting. She believes they ignored her nephews symptoms and connects his experience to her childhood and being ignored. She feels over stimulated by being in the hospital for days. She feels vulnerable since her dog is gone and she does not have a protector. She is tearful in session, as she processes feeling overwhelmed and angry.    Suicidal/Homicidal: Nowithout intent/plan  Therapist Response: Assessed patients current functioning and reviewed progress. Reviewed coping strategies. Assessed patients safety and assisted in identifying protective factors.  Reviewed crisis plan with patient. Assisted patient with the expression of sadness and anger. Reviewed patients self care plan. Assessed progress related to self care. Patients self care is good. Recommend daily exercise, increased socialization and recreation. Used CBT to assist patient with the identification of negative distortions and irrational thoughts. Encouraged patient to verbalize alternative and factual responses which challenge thought distortions. Processed and normalized patients grief reaction. Used motivational interviewing to assist and encourage patient through the change process. Explored patients barriers to change. Reviewed healthy boundaries and assertive communication.   Plan: Return again in one weeks.  Diagnosis: Axis  I: bi polar     Axis II: No diagnosis    Sherica Paternostro, LCSW 09/08/2012

## 2012-09-14 ENCOUNTER — Ambulatory Visit (INDEPENDENT_AMBULATORY_CARE_PROVIDER_SITE_OTHER): Payer: Medicare Other | Admitting: Licensed Clinical Social Worker

## 2012-09-14 DIAGNOSIS — F319 Bipolar disorder, unspecified: Secondary | ICD-10-CM | POA: Diagnosis not present

## 2012-09-14 NOTE — Progress Notes (Signed)
   THERAPIST PROGRESS NOTE  Session Time: 2:00pm-2:50pm  Participation Level: Active  Behavioral Response: Well GroomedAlertAnxious  Type of Therapy: Individual Therapy  Treatment Goals addressed: Coping  Interventions: CBT, Solution Focused, Strength-based, Supportive and Reframing  Summary: Michaela Norris is a 37 y.o. female who presents with anxious mood and affect. She reports a difficult week due to the death of her dog. She processes her sadness and talks about the difficulty grieving because she has many other obligations right now. She questions getting another dog and the right time to do this. She is in the midst of final exams and is trying to find motivation to finish out the school year. She remains disappointed in her program and struggles to find purpose. she processes how her dog was the only living creature that she felt truly loved by and how she feels lost now. Her sleep and appetite are wnl.    Suicidal/Homicidal: Nowithout intent/plan  Therapist Response: Assessed patients current functioning and reviewed progress. Reviewed coping strategies. Assessed patients safety and assisted in identifying protective factors.  Reviewed crisis plan with patient. Assisted patient with the expression of anxiety and fear. Reviewed patients self care plan. Assessed progress related to self care. Patients self care is good. Recommend daily exercise, increased socialization and recreation. Processed and normalized patients grief reaction. Used CBT to assist patient with the identification of negative distortions and irrational thoughts. Encouraged patient to verbalize alternative and factual responses which challenge thought distortions. Used motivational interviewing to assist and encourage patient through the change process. Explored patients barriers to change. Explored options for patient to feel safe and secure in her home.   Plan: Return again in one weeks.  Diagnosis: Axis I: bi  polar     Axis II: No diagnosis    Noemie Devivo, LCSW 09/14/2012

## 2012-09-21 ENCOUNTER — Ambulatory Visit (INDEPENDENT_AMBULATORY_CARE_PROVIDER_SITE_OTHER): Payer: Medicare Other | Admitting: Licensed Clinical Social Worker

## 2012-09-21 DIAGNOSIS — F319 Bipolar disorder, unspecified: Secondary | ICD-10-CM | POA: Diagnosis not present

## 2012-09-21 NOTE — Progress Notes (Signed)
   THERAPIST PROGRESS NOTE  Session Time: 10:30am-11:20am  Participation Level: Active  Behavioral Response: Well GroomedAlertAnxious  Type of Therapy: Individual Therapy  Treatment Goals addressed: Coping  Interventions: CBT, Strength-based, Supportive and Reframing  Summary: Kaveri Perras is a 37 y.o. female who presents with anxious mood and bright affect. She is almost done with school and endorses increased anxiety related to a paper she is working on. She is writing a narrative about her educational experience and she endorses that this process elicits emotions of sadness and anger. She is upset that she was not identified as a child that needed help. She explores how she has been able to fool educators throughout her life. She processes her grief related to the fact that her parents were not actively participating in her life. She is aware that she is unique in her challenges and unique in her strong commitment to understand and change her struggles associated with developmental challenges as an adult. Her sleep and appetite are wnl. She has not started an exercise regime because it hurts.    Suicidal/Homicidal: Nowithout intent/plan  Therapist Response: Assessed patients current functioning and reviewed progress. Reviewed coping strategies. Assessed patients safety and assisted in identifying protective factors.  Reviewed crisis plan with patient. Assisted patient with the expression of frustration . Reviewed patients self care plan. Assessed progress related to self care. Patients self care is good. Recommend daily exercise, increased socialization and recreation. Used CBT to assist patient with the identification of negative distortions and irrational thoughts. Encouraged patient to verbalize alternative and factual responses which challenge thought distortions. Processed and normalized patients grief reaction. Used motivational interviewing to assist and encourage patient through the  change process. Explored patients barriers to change.   Plan: Return again in one weeks.  Diagnosis: Axis I: bi polar    Axis II: No diagnosis    Defne Gerling, LCSW 09/21/2012

## 2012-10-07 ENCOUNTER — Ambulatory Visit (INDEPENDENT_AMBULATORY_CARE_PROVIDER_SITE_OTHER): Payer: Medicare Other | Admitting: Licensed Clinical Social Worker

## 2012-10-07 DIAGNOSIS — F848 Other pervasive developmental disorders: Secondary | ICD-10-CM | POA: Diagnosis not present

## 2012-10-07 DIAGNOSIS — F845 Asperger's syndrome: Secondary | ICD-10-CM

## 2012-10-07 NOTE — Progress Notes (Signed)
   THERAPIST PROGRESS NOTE  Session Time: 2:00pm-2:50pm  Participation Level: Active  Behavioral Response: Well GroomedAlertEuthymic  Type of Therapy: Individual Therapy  Treatment Goals addressed: Coping  Interventions: CBT, Motivational Interviewing, Strength-based, Supportive and Reframing  Summary: Avice Funchess is a 37 y.o. female who presents with euthymic mood and bright affect. she reports doing well and is pleased that she has finished her second year of graduate school. She has decided upon a thesis topic and is now focused on writing her proposal by the end of the summer and writing her entire thesis by February. She expresses some concern that she will be unmotivated during the summer and she wants to focus on volunteering or finding a hobby. She denies any AH, VH or psychosis. She has enrolled her daughter in [employer] and is excited about this.    Suicidal/Homicidal: Nowithout intent/plan  Therapist Response: Assessed patients current functioning and reviewed progress. Reviewed coping strategies. Assessed patients safety and assisted in identifying protective factors.  Reviewed crisis plan with patient. Assisted patient with the expression of frustration with the world and how difficult it is for her and her daughter to fit in . Reviewed patients self care plan. Assessed progress related to self care. Patients self care is good. Recommend daily exercise, increased socialization and recreation. Used CBT to assist patient with the identification of negative distortions and irrational thoughts. Encouraged patient to verbalize alternative and factual responses which challenge thought distortions. Used motivational interviewing to assist and encourage patient through the change process. Explored patients barriers to change. Reviewed and updated her treatment plan.   Plan: Return again in one weeks.  Diagnosis: Axis I: Asperger's Disorder    Axis II: No diagnosis    Isiah Scheel,  LCSW 10/07/2012

## 2012-10-14 ENCOUNTER — Ambulatory Visit (INDEPENDENT_AMBULATORY_CARE_PROVIDER_SITE_OTHER): Payer: Medicare Other | Admitting: Licensed Clinical Social Worker

## 2012-10-14 DIAGNOSIS — F848 Other pervasive developmental disorders: Secondary | ICD-10-CM

## 2012-10-14 DIAGNOSIS — F845 Asperger's syndrome: Secondary | ICD-10-CM

## 2012-10-14 NOTE — Progress Notes (Signed)
   THERAPIST PROGRESS NOTE  Session Time: 2:00pm-2:50pm  Participation Level: Active  Behavioral Response: Well GroomedAlertAnxious  Type of Therapy: Individual Therapy  Treatment Goals addressed: Coping  Interventions: CBT, Solution Focused, Strength-based, Supportive and Reframing  Summary: Michaela Norris is a 37 y.o. female who presents with anxious mood and affect. She reports increased anxiety related to her daughter and processes her frustration that if she does not constantly tell her daughter what to do, that she will sit and do nothing. She expresses fear that her daughter is not going to have a productive future. She describes melt downs that her daughter has and how she tries to help her, but she feels that she only annoys her. Patient is aware that she talks to her daughter probably more than she would like to hear, but she feels helpless and desperate. She wants to do "fun" things this summer and feels that she can't because of the amount of effort it takes to take care of her daughter.    Suicidal/Homicidal: Nowithout intent/plan  Therapist Response: Assessed patients current functioning and reviewed progress. Reviewed coping strategies. Assessed patients safety and assisted in identifying protective factors.  Reviewed crisis plan with patient. Assisted patient with the expression of frustration. Reviewed patients self care plan. Assessed progress related to self care. Patients self care is good. Recommend daily exercise, increased socialization and recreation. Used CBT to assist patient with the identification of negative distortions and irrational thoughts. Encouraged patient to verbalize alternative and factual responses which challenge thought distortions. Used motivational interviewing to assist and encourage patient through the change process. Explored patients barriers to change. Reviewed healthy boundaries and assertive communication. Challenged patient to consider how she  enables her daughter.   Plan: Return again in one weeks.  Diagnosis: Axis I: Asperger's Disorder    Axis II: Deferred    Kristian Mogg, LCSW 10/14/2012

## 2012-10-28 ENCOUNTER — Ambulatory Visit (INDEPENDENT_AMBULATORY_CARE_PROVIDER_SITE_OTHER): Payer: Medicare Other | Admitting: Licensed Clinical Social Worker

## 2012-10-28 DIAGNOSIS — F848 Other pervasive developmental disorders: Secondary | ICD-10-CM | POA: Diagnosis not present

## 2012-10-28 DIAGNOSIS — F845 Asperger's syndrome: Secondary | ICD-10-CM

## 2012-10-28 NOTE — Progress Notes (Signed)
   THERAPIST PROGRESS NOTE  Session Time: 2:00pm-2:50pm  Participation Level: Active  Behavioral Response: Well GroomedAlertAnxious and Euthymic  Type of Therapy: Individual Therapy  Treatment Goals addressed: Coping  Interventions: CBT, Motivational Interviewing, Strength-based, Supportive and Reframing  Summary: Michaela Norris is a 37 y.o. female who presents with euthymic mood and bright affect. She reports that she is doing well since school has ended. She is very focused on self care and is enjoying cooking, planning meals and cleaning. The idea of returning to school in the fall upsets her. She processes her ongoing realization that she is only able to focus on one task at a time in her life. When she is in school, she reports that she has to disassociate from the rest of her life in order to deal with the "truama" of being in school. She identifies going to school as traumatic to all her senses and sometimes she questions why she continues to put herself through this. She reports increased feelings of happiness and contentment when she is at home focusing on her and her daughter. Her sleep and appetite are wnl.    Suicidal/Homicidal: Nowithout intent/plan  Therapist Response: Assessed patients current functioning and reviewed progress. Reviewed coping strategies. Assessed patients safety and assisted in identifying protective factors.  Reviewed crisis plan with patient. Assisted patient with the expression of anxiety . Reviewed patients self care plan. Assessed progress related to self care. Patients self care is very good. Recommend daily exercise, increased socialization and recreation. Used CBT to assist patient with the identification of negative distortions and irrational thoughts. Encouraged patient to verbalize alternative and factual responses which challenge thought distortions. Used motivational interviewing to assist and encourage patient through the change process. Explored  patients barriers to change. Used DBT to practice mindfulness, review distraction list and improve distress tolerance skills. Patient made a significant step by joining the [facility]. She goes frequently to swim and enjoys this.   Plan: Return again in one weeks.  Diagnosis: Axis I: Asperger's Disorder    Axis II: No diagnosis    Akylah Hascall, LCSW 10/28/2012

## 2012-11-04 ENCOUNTER — Ambulatory Visit (INDEPENDENT_AMBULATORY_CARE_PROVIDER_SITE_OTHER): Payer: Medicare Other | Admitting: Licensed Clinical Social Worker

## 2012-11-04 DIAGNOSIS — F848 Other pervasive developmental disorders: Secondary | ICD-10-CM | POA: Diagnosis not present

## 2012-11-04 DIAGNOSIS — F845 Asperger's syndrome: Secondary | ICD-10-CM

## 2012-11-04 NOTE — Progress Notes (Signed)
   THERAPIST PROGRESS NOTE  Session Time: 2:00pm-2:50pm  Participation Level: Active  Behavioral Response: Well GroomedAlertAnxious  Type of Therapy: Individual Therapy  Treatment Goals addressed: Coping  Interventions: CBT, DBT, Motivational Interviewing, Strength-based, Supportive and Reframing  Summary: Sesilia Poucher is a 37 y.o. female who presents with anxious mood and affect. She reports increased feelings of frustration with her daughter. She questions why she refuses to take responsibility for herself and processes her confusion over her daughters inability to plan for her future. She doesn't understand why her daughter doesn't care about her grades. Patient thinks she has a God complex. She endorses high anxiety tied to her need for her daughter to succeed. She checks her daughters email and grades daily to ensure that she knows about her assignments and does them.  She is taking better care of herself and continues to exercise and read. Her sleep and appetite are wnl.   Suicidal/Homicidal: Nowithout intent/plan  Therapist Response: Assessed patients current functioning and reviewed progress. Reviewed coping strategies. Assessed patients safety and assisted in identifying protective factors.  Reviewed crisis plan with patient. Assisted patient with the expression of anxiety. Reviewed patients self care plan. Assessed progress related to self care. Patients self care is good. Recommend daily exercise, increased socialization and recreation. Used CBT to assist patient with the identification of negative distortions and irrational thoughts. Encouraged patient to verbalize alternative and factual responses which challenge thought distortions. Reviewed healthy boundaries and assertive communication. Used DBT to practice mindfulness, review distraction list and improve distress tolerance skills.   Plan: Return again in one weeks.  Diagnosis: Axis I: Asperger's Disorder    Axis II:  Deferred    Cliford Sequeira, LCSW 11/04/2012 2:00pm-2:50pm

## 2012-11-05 ENCOUNTER — Encounter: Payer: Self-pay | Admitting: Family Medicine

## 2012-11-05 ENCOUNTER — Ambulatory Visit: Payer: Self-pay | Admitting: Family Medicine

## 2012-11-05 ENCOUNTER — Ambulatory Visit (INDEPENDENT_AMBULATORY_CARE_PROVIDER_SITE_OTHER): Payer: Medicare Other | Admitting: Family Medicine

## 2012-11-05 VITALS — BP 126/68 | HR 84 | Temp 98.5°F | Ht 68.5 in | Wt 353.0 lb

## 2012-11-05 DIAGNOSIS — R5381 Other malaise: Secondary | ICD-10-CM | POA: Diagnosis not present

## 2012-11-05 DIAGNOSIS — R42 Dizziness and giddiness: Secondary | ICD-10-CM | POA: Insufficient documentation

## 2012-11-05 DIAGNOSIS — R143 Flatulence: Secondary | ICD-10-CM

## 2012-11-05 DIAGNOSIS — R7303 Prediabetes: Secondary | ICD-10-CM

## 2012-11-05 DIAGNOSIS — R7309 Other abnormal glucose: Secondary | ICD-10-CM | POA: Diagnosis not present

## 2012-11-05 DIAGNOSIS — R5383 Other fatigue: Secondary | ICD-10-CM

## 2012-11-05 DIAGNOSIS — R141 Gas pain: Secondary | ICD-10-CM | POA: Insufficient documentation

## 2012-11-05 NOTE — Progress Notes (Signed)
  Subjective:    Patient ID: Michaela Norris, female    DOB: August 30, 1975, 37 y.o.   MRN: [id number]  HPI Pt is attempting to exercise for weight loss but is finding herself light headed.  This is causes her to panic which worsens the light headed feelings.  Pt has hx of fatigue.  Is doing water fitness/strength training w/out difficulty but will have sxs w/ cardio.  No CP, SOB.  'it feels like falling asleep'.  abd pain- 'feels like everything sits very heavy after eating'.  Having difficulty digesting large amounts of fiber in her diet.  Will get sharp pain under L rib cage.  Pt complains of abd distension.  Denies gas or belching.   Review of Systems For ROS see HPI     Objective:   Physical Exam  Vitals reviewed. Constitutional: She is oriented to person, place, and time. She appears well-developed and well-nourished. No distress.  Morbidly obese  HENT:  Head: Normocephalic and atraumatic.  Eyes: Conjunctivae and EOM are normal. Pupils are equal, round, and reactive to light.  Neck: Normal range of motion. Neck supple. No thyromegaly present.  Cardiovascular: Normal rate, regular rhythm, normal heart sounds and intact distal pulses.   No murmur heard. Pulmonary/Chest: Effort normal and breath sounds normal. No respiratory distress.  Abdominal: Soft. Bowel sounds are normal. She exhibits no distension. There is no tenderness. There is no rebound and no guarding.  Musculoskeletal: She exhibits no edema.  Lymphadenopathy:    She has no cervical adenopathy.  Neurological: She is alert and oriented to person, place, and time.  Skin: Skin is warm and dry.  Psychiatric: She has a normal mood and affect. Her behavior is normal.          Assessment & Plan:

## 2012-11-05 NOTE — Assessment & Plan Note (Signed)
New.  Pt's LUQ pain after eating high fiber diet consistent w/ gas.  Encouraged Bean-O prior to eating.  Gas-X prn. Will follow.

## 2012-11-05 NOTE — Assessment & Plan Note (Signed)
New.  No obvious cause but suspect pt's dizziness w/ exertion is due to her morbid obesity and either a cardiac output that doesn't keep up with demand or that her lung capacity isn't enough to support her.  Check labs to r/o anemia, thyroid abnormality, or electrolyte imbalance.  Will refer to cards for evaluation for pt's piece of mind and reassurance that she is able to work out.

## 2012-11-05 NOTE — Assessment & Plan Note (Signed)
Pt is attempting to exercise but is having dizziness (see above).  Will check labs as pt has not been seen recently.

## 2012-11-05 NOTE — Patient Instructions (Addendum)
We'll call you with your cardiology appt Increase your water intake Change positions slowly!! Take the Bean-O to decrease gas and help digest the fiber Hang in there!!

## 2012-11-06 LAB — CBC WITH DIFFERENTIAL/PLATELET
Basophils Relative: 1.1 % (ref 0.0–3.0)
Eosinophils Absolute: 0.3 10*3/uL (ref 0.0–0.7)
HCT: 33.6 % — ABNORMAL LOW (ref 36.0–46.0)
Hemoglobin: 10.4 g/dL — ABNORMAL LOW (ref 12.0–15.0)
Lymphocytes Relative: 28 % (ref 12.0–46.0)
Lymphs Abs: 2.8 10*3/uL (ref 0.7–4.0)
MCHC: 30.9 g/dL (ref 30.0–36.0)
Monocytes Relative: 4.2 % (ref 3.0–12.0)
Neutro Abs: 6.5 10*3/uL (ref 1.4–7.7)
RBC: 5.14 Mil/uL — ABNORMAL HIGH (ref 3.87–5.11)

## 2012-11-06 LAB — BASIC METABOLIC PANEL
BUN: 7 mg/dL (ref 6–23)
CO2: 25 mEq/L (ref 19–32)
Calcium: 9.3 mg/dL (ref 8.4–10.5)
Chloride: 106 mEq/L (ref 96–112)
Creatinine, Ser: 0.4 mg/dL (ref 0.4–1.2)

## 2012-11-11 ENCOUNTER — Ambulatory Visit (INDEPENDENT_AMBULATORY_CARE_PROVIDER_SITE_OTHER): Payer: Medicare Other | Admitting: Licensed Clinical Social Worker

## 2012-11-11 DIAGNOSIS — F848 Other pervasive developmental disorders: Secondary | ICD-10-CM

## 2012-11-11 DIAGNOSIS — F845 Asperger's syndrome: Secondary | ICD-10-CM

## 2012-11-11 NOTE — Progress Notes (Signed)
   THERAPIST PROGRESS NOTE  Session Time: 2:00pm-2:50pm  Participation Level: Active  Behavioral Response: Well GroomedAlertAnxious  Type of Therapy: Individual Therapy  Treatment Goals addressed: Anxiety and Coping  Interventions: CBT, Strength-based, Supportive and Reframing  Summary: Michaela Norris is a 37 y.o. female who presents with anxious mood and affect. She reports feeling better and less frustrated with her daughter since the last session and she realizes that it is her varied capacity to manage her daughters immature behavior that frustrates her. She processes her frustration that she has been waiting a "long time" for her daughter to grow up so she can feel confident that she will be able to care for herself if something happened to her. She is unable to have this confidence and instead endorses chronic hypervigilance. She feels that fear is the only motivator for her and without it she would accomplish nothing. She continues to go to the [facility] and exercise. Her sleep and appetite are wnl.    Suicidal/Homicidal: Nowithout intent/plan  Therapist Response: Assessed patients current functioning and reviewed progress. Reviewed coping strategies. Assessed patients safety and assisted in identifying protective factors.  Reviewed crisis plan with patient. Assisted patient with the expression of frustration and anxiety. Reviewed patients self care plan. Assessed progress related to self care. Patients self care is good. Recommend daily exercise, increased socialization and recreation. Used CBT to assist patient with the identification of negative distortions and irrational thoughts. Encouraged patient to verbalize alternative and factual responses which challenge thought distortions. Used motivational interviewing to assist and encourage patient through the change process. Explored patients barriers to change.   Plan: Return again in one weeks.  Diagnosis: Axis I: Asperger's  Disorder    Axis II: No diagnosis    Allie Gerhold, LCSW 11/11/2012

## 2012-11-18 ENCOUNTER — Ambulatory Visit (HOSPITAL_COMMUNITY): Payer: Self-pay | Admitting: Licensed Clinical Social Worker

## 2012-11-23 ENCOUNTER — Encounter: Payer: Self-pay | Admitting: Family Medicine

## 2012-11-23 ENCOUNTER — Ambulatory Visit (INDEPENDENT_AMBULATORY_CARE_PROVIDER_SITE_OTHER): Payer: Medicare Other | Admitting: Family Medicine

## 2012-11-23 VITALS — BP 130/76 | HR 77 | Temp 98.4°F | Ht 68.5 in | Wt 346.2 lb

## 2012-11-23 DIAGNOSIS — M25559 Pain in unspecified hip: Secondary | ICD-10-CM

## 2012-11-23 DIAGNOSIS — G932 Benign intracranial hypertension: Secondary | ICD-10-CM | POA: Diagnosis not present

## 2012-11-23 DIAGNOSIS — M25552 Pain in left hip: Secondary | ICD-10-CM

## 2012-11-23 DIAGNOSIS — M25551 Pain in right hip: Secondary | ICD-10-CM | POA: Insufficient documentation

## 2012-11-23 MED ORDER — MELOXICAM 15 MG PO TABS: 15.0000 mg | ORAL_TABLET | Freq: Every day | ORAL | Status: DC

## 2012-11-23 NOTE — Assessment & Plan Note (Signed)
New.  Suspect this is mechanical due to pt's size and abdominal pannus.  Pt has recently started exercising and would like to continue but doesn't want to do any additional damage.  Start scheduled NSAIDs.  Refer to ortho for complete evaluation and tx.  Pt expressed understanding and is in agreement w/ plan.

## 2012-11-23 NOTE — Assessment & Plan Note (Signed)
Pt let neuro referral expire- would like re-referred.

## 2012-11-23 NOTE — Progress Notes (Signed)
  Subjective:    Patient ID: Michaela Norris, female    DOB: 04/09/1976, 37 y.o.   MRN: [id number]  HPI Hip pain- pt has chronic pain in lower back and hips 'it's like a burning pain'.  Recently w/ working out pt has developed sharp pains in hips bilaterally.  Sharp pains are located more towards groin.  Pt has not tried OTC anti-inflammatories for this.  Is trying to lose weight and has recently lost 6 lbs and would like to continue exercising but fears doing damage.  Pt is aware that she is circumducting legs to get around abdomen.   Review of Systems For ROS see HPI     Objective:   Physical Exam  Vitals reviewed. Constitutional: She is oriented to person, place, and time. She appears well-developed and well-nourished. No distress.  Morbidly obese  Abdominal:  Large, overhanging pannus  Musculoskeletal: She exhibits no edema.  Neurological: She is alert and oriented to person, place, and time. Coordination normal.  Skin: Skin is warm and dry.  Psychiatric: She has a normal mood and affect. Her behavior is normal.          Assessment & Plan:

## 2012-11-23 NOTE — Patient Instructions (Addendum)
We'll call you with your ortho and neuro appts Start the Mobic daily for inflammation Continue the pool workouts, consider the elliptical, and hold off on the treadmill Keep up the good work!  I'm proud of you!

## 2012-11-25 ENCOUNTER — Ambulatory Visit (INDEPENDENT_AMBULATORY_CARE_PROVIDER_SITE_OTHER): Payer: Medicare Other | Admitting: Licensed Clinical Social Worker

## 2012-11-25 DIAGNOSIS — F845 Asperger's syndrome: Secondary | ICD-10-CM

## 2012-11-25 DIAGNOSIS — F848 Other pervasive developmental disorders: Secondary | ICD-10-CM

## 2012-11-25 NOTE — Progress Notes (Signed)
   THERAPIST PROGRESS NOTE  Session Time: 2:00pm-2:50pm  Participation Level: Active  Behavioral Response: Well GroomedAlertAnxious  Type of Therapy: Individual Therapy  Treatment Goals addressed: Coping  Interventions: CBT, Motivational Interviewing, Solution Focused, Strength-based, Supportive and Reframing  Summary: Michaela Norris is a 37 y.o. female who presents with anxious mood and affect. She expresses concern that she is in a dark place with the return of AH, VH and paranoia. She sees her deceased dog and is upset by this. She feels this makes it difficult for her to move on. She wants to get another dog, but is uncertain if she is ready. Her AH are maintained by her fixed delusion of children she believes exist. She is able to distinguish reality and is oriented today. She processes her realization that her dog served as an anchor for her mental health and that her increase in symptoms is related to the loss of the dog and being out of school. She is exercising regularly and has made a commitment to this. She is taking good care of her body, but does endorse an increase in hypochodriac thoughts. Her sleep and appetite are wnl.    Suicidal/Homicidal: Nowithout intent/plan  Therapist Response: Assessed patients current functioning and reviewed progress. Reviewed coping strategies. Assessed patients safety and assisted in identifying protective factors.  Reviewed crisis plan with patient. Assisted patient with the expression of anxiety. Reviewed patients self care plan. Assessed progress related to self care. Patients self care is very good. Recommend daily exercise, increased socialization and recreation. Used CBT to assist patient with the identification of negative distortions and irrational thoughts. Encouraged patient to verbalize alternative and factual responses which challenge thought distortions. Used DBT to practice mindfulness, review distraction list and improve distress  tolerance skills. Used motivational interviewing to assist and encourage patient through the change process. Explored patients barriers to change.   Plan: Return again in one weeks.  Diagnosis: Axis I: Asperger's Disorder    Axis II: No diagnosis    Dixie Jafri, LCSW 11/25/2012

## 2012-11-26 ENCOUNTER — Telehealth: Payer: Self-pay

## 2012-11-26 NOTE — Telephone Encounter (Signed)
Call from the pharmacy who advised that patient is currently on Lithium and Dr.Tabori wrote and Rx for Mobic, she said the Lithium can increase the effect of the Mobic and she wanted to know if this was ok to fill.   No record of the patient taking Lithium, Dr.Lowne advises to call the patient to confirm.     KP

## 2012-11-26 NOTE — Telephone Encounter (Signed)
A msg has been left to call the office     KP

## 2012-11-28 NOTE — Telephone Encounter (Signed)
Yes- she can take the Lithium and Mobic together and should not have any problems.  But if she's concerned, the better person to call is her psychiatrist.

## 2012-11-30 NOTE — Telephone Encounter (Signed)
Called and spoke with pt and informed her that Dr. Beverely Low said it was okay to take both the Lithium and Mobic together and should not have any problems.  Pt stated that she has not been on Lithium for 2-3 years.  Called pharmacy([facility]) and spoke with(Emily) and informed her that the pt is not taking Lithium and have not taking it for 2-3 years.  It's okay to fill the Mobic.   Irving Burton stated that they will go ahead and fill the Mobic.//AB/CMA

## 2012-12-01 ENCOUNTER — Ambulatory Visit (INDEPENDENT_AMBULATORY_CARE_PROVIDER_SITE_OTHER): Payer: Medicare Other | Admitting: Licensed Clinical Social Worker

## 2012-12-01 DIAGNOSIS — F845 Asperger's syndrome: Secondary | ICD-10-CM

## 2012-12-01 DIAGNOSIS — F848 Other pervasive developmental disorders: Secondary | ICD-10-CM

## 2012-12-01 NOTE — Progress Notes (Signed)
   THERAPIST PROGRESS NOTE  Session Time: 4:00pm-4:50pm  Participation Level: Active  Behavioral Response: Well GroomedAlertAnxious  Type of Therapy: Individual Therapy  Treatment Goals addressed: Coping  Interventions: CBT, Strength-based, Supportive and Reframing  Summary: Michaela Norris is a 37 y.o. female who presents with anxious mood and affect. She was just informed that her daughter may have Celiac disease and requires an endoscopy, which she knows her daughter will refuse. She will also have to have her wisdom teeth out and she expresses concern over how difficult her daughter will be to manage. She adopted a new dog and is pleased about this. As a result, her anxiety and fear in her home have decreased. 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 Her sleep and appetite are wnl.    Suicidal/Homicidal: Nowithout intent/plan  Therapist Response: Assessed patients current functioning and reviewed progress. Reviewed coping strategies. Assessed patients safety and assisted in identifying protective factors.  Reviewed crisis plan with patient. Assisted patient with the expression of anxiety. Reviewed patients self care plan. Assessed progress related to self care. Patients self care is good. Recommend daily exercise, increased socialization and recreation. Used CBT to assist patient with the identification of negative distortions and irrational thoughts. Encouraged patient to verbalize alternative and factual responses which challenge thought distortions. Reviewed healthy boundaries and assertive communication.   Plan: Return again in one weeks.  Diagnosis: Axis I: Asperger's Disorder    Axis II: No diagnosis    Deasia Chiu, LCSW 12/01/2012

## 2012-12-03 ENCOUNTER — Encounter: Payer: Self-pay | Admitting: Family Medicine

## 2012-12-03 DIAGNOSIS — M25559 Pain in unspecified hip: Secondary | ICD-10-CM | POA: Diagnosis not present

## 2012-12-03 DIAGNOSIS — R109 Unspecified abdominal pain: Secondary | ICD-10-CM

## 2012-12-03 DIAGNOSIS — M25469 Effusion, unspecified knee: Secondary | ICD-10-CM | POA: Diagnosis not present

## 2012-12-03 DIAGNOSIS — M1710 Unilateral primary osteoarthritis, unspecified knee: Secondary | ICD-10-CM | POA: Diagnosis not present

## 2012-12-03 DIAGNOSIS — M25569 Pain in unspecified knee: Secondary | ICD-10-CM | POA: Diagnosis not present

## 2012-12-07 ENCOUNTER — Ambulatory Visit (INDEPENDENT_AMBULATORY_CARE_PROVIDER_SITE_OTHER): Payer: Medicare Other | Admitting: Licensed Clinical Social Worker

## 2012-12-07 DIAGNOSIS — F848 Other pervasive developmental disorders: Secondary | ICD-10-CM | POA: Diagnosis not present

## 2012-12-07 DIAGNOSIS — F845 Asperger's syndrome: Secondary | ICD-10-CM

## 2012-12-07 NOTE — Telephone Encounter (Signed)
Patient is requesting a test for Celiac Disease(?) due to her daughters recent diagnosis. Please advise? GF/RN

## 2012-12-07 NOTE — Progress Notes (Signed)
   THERAPIST PROGRESS NOTE  Session Time: 1:00pm-1:50pm  Participation Level: Active  Behavioral Response: Well GroomedAlertAnxious  Type of Therapy: Individual Therapy  Treatment Goals addressed: Coping  Interventions: CBT, Motivational Interviewing, Solution Focused, Strength-based, Supportive and Reframing  Summary: Michaela Norris is a 37 y.o. female who presents with anxious mood and frustrated affect. She reports feeling overwhelmed by her daughters ongoing challenging behavior. She is also anxious about her daughter having Celiac disease and how this will negatively impact her. She processes her fear that since it is too expensive to purchase Gluten free food, she will have to make everything home made. She remains angry and confused over her daughters lack of desire in life. She realizes that she has spoiled her terribly and did not realize it when she was doing it. She is honest when she admits that being overly involved in her daughters life serves as a means of distraction from her own dark thoughts. She is challenged to find other things of interest which will distract her as well.    Suicidal/Homicidal: Nowithout intent/plan  Therapist Response: Assessed patients current functioning and reviewed progress. Reviewed coping strategies. Assessed patients safety and assisted in identifying protective factors.  Reviewed crisis plan with patient. Assisted patient with the expression of frustration and anxiety. Reviewed patients self care plan. Assessed progress related to self care. Patients self care is good. Recommend daily exercise, increased socialization and recreation. Used CBT to assist patient with the identification of negative distortions and irrational thoughts. Encouraged patient to verbalize alternative and factual responses which challenge thought distortions. Used DBT to practice mindfulness, review distraction list and improve distress tolerance skills. Used motivational  interviewing to assist and encourage patient through the change process. Explored patients barriers to change.   Plan: Return again in one weeks.  Diagnosis: Axis I: Asperger's Disorder    Axis II: No diagnosis    Magdalena Skilton, LCSW 12/07/2012

## 2012-12-15 ENCOUNTER — Ambulatory Visit (INDEPENDENT_AMBULATORY_CARE_PROVIDER_SITE_OTHER): Payer: Medicare Other | Admitting: Licensed Clinical Social Worker

## 2012-12-15 ENCOUNTER — Telehealth: Payer: Self-pay | Admitting: Family Medicine

## 2012-12-15 DIAGNOSIS — F848 Other pervasive developmental disorders: Secondary | ICD-10-CM | POA: Diagnosis not present

## 2012-12-15 DIAGNOSIS — F845 Asperger's syndrome: Secondary | ICD-10-CM

## 2012-12-15 NOTE — Telephone Encounter (Signed)
Patient wants to come in for labs to be tested for Celiac disease. Advised patient we would call back for scheduling after order have been placed for this.

## 2012-12-15 NOTE — Telephone Encounter (Signed)
Spoke with the pt and she stated that she has been speaking with Dr. Beverely Low through MyChart regarding this.  Informed the pt I will check MyChart to see what Dr. Beverely Low would like for her to do.  Pt agreed.//AB/CMA

## 2012-12-15 NOTE — Telephone Encounter (Signed)
LM @ (3:26pm) asking the pt to RTC regarding request to have labs drawn.//AB/CMA

## 2012-12-15 NOTE — Progress Notes (Signed)
   THERAPIST PROGRESS NOTE  Session Time: 1:00pm-1:50pm  Participation Level: Active  Behavioral Response: Well GroomedAlertAnxious and Irritable  Type of Therapy: Individual Therapy  Treatment Goals addressed: Coping  Interventions: CBT, Strength-based, Supportive and Reframing  Summary: Michaela Norris is a 37 y.o. female who presents with anxious mood and irritable affect. She just learned that her daughter has been diagnosed with Celiac disease. She endorses anxiety and agitation over this. She is worried about her daughters lack of enthusiasm regarding diet choices and she realizes that she will have to cook more and differently. She explores her anxiety related to her daughters diagnosis of Aspergers and if Celiac disease contributed in any way to increasing the severity of Aspergers in her daughter. She is overwhelmed by doctors encouraging her to have bariactric surgery when she does not believe she is a good candidate. Her sleep and appetite are wnl.   Suicidal/Homicidal: Nowithout intent/plan  Therapist Response: Assessed patients current functioning and reviewed progress. Reviewed coping strategies. Assessed patients safety and assisted in identifying protective factors.  Reviewed crisis plan with patient. Assisted patient with the expression of anxiety . Reviewed patients self care plan. Assessed progress related to self care. Patients self care is good. Recommend daily exercise, increased socialization and recreation. Used CBT to assist patient with the identification of negative distortions and irrational thoughts. Encouraged patient to verbalize alternative and factual responses which challenge thought distortions. Used DBT to practice mindfulness, review distraction list and improve distress tolerance skills. Used motivational interviewing to assist and encourage patient through the change process. Explored patients barriers to change.   Plan: Return again in one  weeks.  Diagnosis: Axis I: Asperger's Disorder    Axis II: No diagnosis    Michaela Lukas, LCSW 12/15/2012

## 2012-12-16 ENCOUNTER — Other Ambulatory Visit: Payer: Medicare Other

## 2012-12-16 ENCOUNTER — Encounter: Payer: Self-pay | Admitting: Neurology

## 2012-12-16 ENCOUNTER — Ambulatory Visit (INDEPENDENT_AMBULATORY_CARE_PROVIDER_SITE_OTHER): Payer: Medicare Other | Admitting: Neurology

## 2012-12-16 VITALS — BP 122/79 | HR 82 | Temp 97.1°F | Ht 68.5 in

## 2012-12-16 DIAGNOSIS — R109 Unspecified abdominal pain: Secondary | ICD-10-CM

## 2012-12-16 DIAGNOSIS — G932 Benign intracranial hypertension: Secondary | ICD-10-CM | POA: Diagnosis not present

## 2012-12-16 DIAGNOSIS — G4733 Obstructive sleep apnea (adult) (pediatric): Secondary | ICD-10-CM

## 2012-12-16 NOTE — Telephone Encounter (Signed)
Please schedule pt for a lab visit for pt at her convenience

## 2012-12-16 NOTE — Patient Instructions (Addendum)
I think overall you are doing fairly well but I do want to suggest a few things today:  Remember to drink plenty of fluid, eat healthy meals and do not skip any meals. Try to eat protein with a every meal and eat a healthy snack such as fruit or nuts in between meals. Try to keep a regular sleep-wake schedule and try to exercise daily, particularly in the form of walking, 20-30 minutes a day, if you can.   As far as your medications are concerned, I would like to suggest a trial of Diamox if your spinal fluid pressure is indeed high.    As far as diagnostic testing: sleep study, eye doctor referral and LP   I would like to see you back in 3 months, sooner if we need to. Please call us with any interim questions, concerns, problems, updates or refill requests.  Please also call us for any test results so we can go over those with you on the phone. Brett Canales is my clinical assistant and will answer any of your questions and relay your messages to me and also relay most of my messages to you.  Our phone number is [phone]. We also have an after hours call service for urgent matters and there is a physician on-call for urgent questions. For any emergencies you know to call 911 or go to the nearest emergency room.

## 2012-12-16 NOTE — Progress Notes (Signed)
Subjective:    Patient ID: Michaela Norris is a 37 y.o. female.  HPI  Huston Foley, MD, PhD [facility] [address], [address] [address] [place], [place] [address]   Dear Dr. Beverely Low,   I saw your patient, Michaela Norris, upon your kind request in my neurologic clinic today for initial consultation of her history of pseudotumor cerebri. The patient is unaccompanied today. As you know, [name] is a very pleasant 37 year old right-handed woman with an underlying medical history of obesity, asperger's syndrome, schizo-affective d/o, and low back pain who was diagnosed with a probable Dx of pseudotumor cerebri perhaps 5 years ago, by an ophthalmologist at the time, who found papilledema. She saw 2 other neurologists in our office in the past and was prescribed Diamox by Dr. Vickey Huger in our office and she was last seen by Dr. Vickey Huger in 5/11, at which time she still had not had her LP due to not showing up for the tap and claiming she felt better. She had MRI/MRA/MRV in the past which were negative and she felt better after coming off of Lithium in 2012.  She describes a periorbital pain, no visual blurriness, except in the dark and she has mild pulsatile tinnitus and c/o lightheadedness. She had SE on Diamox d/t numbness in her hands and feet. She saw an ophthalmologist last year who did not find evidence of Papilledema. She endorses snoring and has had choking sensations or dreams of breathing pauses and often wakes up in a panic and sweaty. She is tired during the day. She is trying to lose weight. She lost about 6 lb in the last month.   Her Past Medical History Is Significant For: Past Medical History  Diagnosis Date  . Anxiety   . Depression   . Headache(784.0)   . Psychosis   . Schizoaffective disorder   . Mania   . Obesity   . PTSD (post-traumatic stress disorder)   . High blood pressure     off meds at this time  . Diabetes mellitus     notes borderline   . Hyperlipidemia     Her Past Surgical History Is Significant For: Past Surgical History  Procedure Laterality Date  . Fracture surgery    . Cholecystectomy    . Broken ankle  2000    Her Family History Is Significant For: Family History  Problem Relation Age of Onset  . Alcohol abuse Mother   . Depression Mother   . Alcohol abuse Brother   . Suicidality Maternal Grandmother   . Anorexia nervosa Maternal Grandmother   . Asperger's syndrome Daughter   . Schizophrenia Father   . Brain cancer Father   . Early death Father     Her Social History Is Significant For: History   Social History  . Marital Status: Single    Spouse Name: N/A    Number of Children: N/A  . Years of Education: N/A   Social History Main Topics  . Smoking status: Never Smoker   . Smokeless tobacco: Never Used  . Alcohol Use: No  . Drug Use: No  . Sexually Active: Not Currently   Other Topics Concern  . None   Social History Narrative  . None    Her Allergies Are:  Allergies  Allergen Reactions  . Latex Shortness Of Breath and Rash  . Tegretol (Carbamazepine) Rash  . Vicodin (Hydrocodone-Acetaminophen) Rash  :   Her Current Medications Are:  Outpatient Encounter Prescriptions as of  12/16/2012  Medication Sig Dispense Refill  . cyclobenzaprine (FLEXERIL) 10 MG tablet Take 10 mg by mouth at bedtime.      . meloxicam (MOBIC) 15 MG tablet Take 1 tablet (15 mg total) by mouth daily.  30 tablet  3  . Multiple Vitamin (MULTIVITAMIN) tablet Take 1 tablet by mouth daily.      . fenofibrate 160 MG tablet Take 1 tablet (160 mg total) by mouth daily.  30 tablet  6  . [DISCONTINUED] azelastine (ASTELIN) 137 MCG/SPRAY nasal spray Place 2 sprays into the nose 2 (two) times daily. Use in each nostril as directed  30 mL  1   No facility-administered encounter medications on file as of 12/16/2012.    Review of Systems  Constitutional: Positive for fatigue.  Eyes: Positive for photophobia and pain.   Respiratory:       Snoring  Endocrine: Positive for heat intolerance and polydipsia.  Musculoskeletal: Positive for joint swelling and arthralgias.  Allergic/Immunologic: Positive for environmental allergies.  Neurological: Positive for dizziness.  Hematological:       Anemia  Psychiatric/Behavioral: The patient is nervous/anxious.     Objective:  Neurologic Exam  Physical Exam Physical Examination:   Filed Vitals:   12/16/12 1333  BP: 122/79  Pulse: 82  Temp: 97.1 F (36.2 C)    General Examination: The patient is a very pleasant 37 y.o. female in no acute distress. She is morbidly obese. She was 346 lb recently.  HEENT: Normocephalic, atraumatic, pupils are equal, round and reactive to light and accommodation. Funduscopic exam is normal with sharp disc margins noted. Extraocular tracking is good without limitation to gaze excursion or nystagmus noted. Normal smooth pursuit is noted. Visual fields are full to finger perimetry. Hearing is grossly intact. Tympanic membranes are clear bilaterally. Face is symmetric with normal facial animation and normal facial sensation. Speech is clear with no dysarthria noted. There is no hypophonia. There is no lip, neck/head, jaw or voice tremor. Neck is supple with full range of passive and active motion. There are no carotid bruits on auscultation. Oropharynx exam reveals: mild mouth dryness, adequate dental hygiene and moderate airway crowding, due to narrow airway and tonsillar size of 2+. Mallampati is class II. Tongue protrudes centrally and palate elevates symmetrically. Neck size is 16.5 inches.   Chest: Clear to auscultation without wheezing, rhonchi or crackles noted.  Heart: S1+S2+0, regular and normal without murmurs, rubs or gallops noted.   Abdomen: Soft, non-tender and non-distended with normal bowel sounds appreciated on auscultation.  Extremities: There is no pitting edema in the distal lower extremities bilaterally. Pedal  pulses are intact.  Skin: Warm and dry without trophic changes noted. There are no varicose veins.  Musculoskeletal: exam reveals no obvious joint deformities, tenderness or joint swelling or erythema.   Neurologically:  Mental status: The patient is awake, alert and oriented in all 4 spheres. Her memory, attention, language and knowledge are appropriate. There is no aphasia, agnosia, apraxia or anomia. Speech is clear with normal prosody and enunciation. Thought process is linear. Mood is congruent and affect is blunted and constricted.  Cranial nerves are as described above under HEENT exam. In addition, shoulder shrug is normal with equal shoulder height noted. Motor exam: Normal bulk, strength and tone is noted. There is no drift, tremor or rebound. Romberg is negative. Reflexes are 1+ throughout. Fine motor skills are intact with normal finger taps, normal hand movements, normal rapid alternating patting, normal foot taps and normal foot agility.  Cerebellar testing shows no dysmetria or intention tremor on finger to nose testing. Heel to shin is unremarkable bilaterally. There is no truncal or gait ataxia.  Sensory exam is intact to light touch, pinprick, vibration, temperature sense in the upper and lower extremities.  Gait, station and balance are unremarkable, except for slow in movement d/t body habitus. She turns en bloc. Tandem walk is difficult for her. Intact toe stance is noted, but has difficulty with heel stance.               Assessment and Plan:   Assessment and Plan:  In summary, Jniya Madara is a very pleasant 37 y.o.-year old female with a history of presumed PTC. Her history and physical exam are also concerning for obstructive sleep apnea (OSA). I had a long chat with the patient about my findings and the diagnosis of pseudotumor and how we have to verify if the pressure is high and how it may affect her eye sight. I also talked about the risks and ramifications of  untreated moderate to severe OSA, especially with respect to developing cardiovascular disease down the Road, including congestive heart failure, difficult to treat hypertension, cardiac arrhythmias, or stroke. Even type 2 diabetes has in part been linked to untreated OSA. We talked about trying to maintain a healthy lifestyle in general, as well as the importance of weight control both for OSA and for PTC. I encouraged the patient to eat healthy, exercise daily and keep well hydrated, to keep a scheduled bedtime and wake time routine, to not skip any meals and eat healthy snacks in between meals.  I recommended the following at this time: sleep study with potential CPAP, ophthalmology referral, LP for pressure eval. We may have to restart Diamox, based on her opening pressure and whether there is papilledema, which I did not detect today. She would be willing to try diamox again, aware of potential similar SEs.   I explained the sleep test procedure to the patient and also outlined surgical and non-surgical treatment options of OSA including the use of a dental custom-made appliance, upper airway surgery such as pillar implants, radiofrequency surgery, tongue base surgery, and UPPP. I also explained the CPAP treatment option to the patient, who indicated that she would be willing to try CPAP if the need arises. I explained the importance of being compliant with PAP treatment, not only for insurance purposes but primarily for the patient's long term health benefit. I answered all her questions today and the patient was in agreement. I would like to see her back after the sleep study, the LP and the ophthalmology appointment are completed and encouraged her to call with any interim questions, concerns, problems or updates.   Thank you very much for allowing me to participate in the care of this nice patient. If I can be of any further assistance to you please do not hesitate to call me at  [phone].  Sincerely,   Huston Foley, MD, PhD

## 2012-12-17 DIAGNOSIS — R109 Unspecified abdominal pain: Secondary | ICD-10-CM | POA: Diagnosis not present

## 2012-12-23 ENCOUNTER — Telehealth: Payer: Self-pay | Admitting: Family Medicine

## 2012-12-23 DIAGNOSIS — H5710 Ocular pain, unspecified eye: Secondary | ICD-10-CM | POA: Diagnosis not present

## 2012-12-23 NOTE — Telephone Encounter (Signed)
Patient is calling in regards to her lab results from 12/16/12. States she missed a call. Please advise.

## 2012-12-23 NOTE — Telephone Encounter (Signed)
Spoke with patient and advised of negative lab results.

## 2012-12-29 ENCOUNTER — Ambulatory Visit (INDEPENDENT_AMBULATORY_CARE_PROVIDER_SITE_OTHER): Payer: Medicare Other | Admitting: Licensed Clinical Social Worker

## 2012-12-29 DIAGNOSIS — F845 Asperger's syndrome: Secondary | ICD-10-CM

## 2012-12-29 DIAGNOSIS — F848 Other pervasive developmental disorders: Secondary | ICD-10-CM

## 2012-12-29 NOTE — Progress Notes (Signed)
   THERAPIST PROGRESS NOTE  Session Time: 2:00pm-2:50pm  Participation Level: Active  Behavioral Response: Well GroomedAlertAnxious  Type of Therapy: Individual Therapy  Treatment Goals addressed: Coping  Interventions: CBT, Strength-based, Supportive and Reframing  Summary: [name] is a 37 y.o. female who presents with euthymic mood and anxious affect. She reports doing well and that she has been focused on changing her daughters diet to Gluten free. She processes her anxiety over learning how progressed her daughters Celiac disease is and that she has severe damage already. Patient reports a shot of adrenaline after hearing this news and has been able to make immediate changes. She is proud of herself for doing this because it typically takes her a long time to adjust and make changes. She is ready to start school again. She has joined a support group for Aspergers adults and is hopeful about this.   Suicidal/Homicidal: Nowithout intent/plan  Therapist Response: Assessed patients current functioning and reviewed progress. Reviewed coping strategies. Assessed patients safety and assisted in identifying protective factors.  Reviewed crisis plan with patient. Assisted patient with the expression of anxieyt. Reviewed patients self care plan. Assessed progress related to self care. Patients self care is good. Recommend daily exercise, increased socialization and recreation. Used CBT to assist patient with the identification of negative distortions and irrational thoughts. Encouraged patient to verbalize alternative and factual responses which challenge thought distortions. Reviewed healthy boundaries and assertive communication. Used motivational interviewing to assist and encourage patient through the change process. Explored patients barriers to change.   Plan: Return again in two weeks.  Diagnosis: Axis I: Asperger's Disorder    Axis II: No diagnosis    Pheobe Sandiford,  LCSW 12/29/2012

## 2012-12-30 ENCOUNTER — Encounter: Payer: Self-pay | Admitting: Cardiovascular Disease

## 2012-12-30 ENCOUNTER — Ambulatory Visit (INDEPENDENT_AMBULATORY_CARE_PROVIDER_SITE_OTHER): Payer: Medicare Other | Admitting: Cardiovascular Disease

## 2012-12-30 VITALS — BP 130/76 | HR 78 | Ht 68.5 in | Wt 346.1 lb

## 2012-12-30 DIAGNOSIS — R42 Dizziness and giddiness: Secondary | ICD-10-CM

## 2012-12-30 NOTE — Patient Instructions (Addendum)
Your physician recommends that you schedule a follow-up appointment as needed with Dr. McAlhany  Your physician has requested that you have an echocardiogram. Echocardiography is a painless test that uses sound waves to create images of your heart. It provides your doctor with information about the size and shape of your heart and how well your heart's chambers and valves are working. This procedure takes approximately one hour. There are no restrictions for this procedure.    

## 2012-12-30 NOTE — Progress Notes (Signed)
History of Present Illness: 37 yo female with history of bordernlien DM, borderline HTN on no therapy, anxiety, depression, PTSD, OSA, morbid obesity, Aspergers syndrome who is here today as a new patient for evaluation of dizziness. She tells me that she is anemic and has occasional dizziness. No near syncope or syncope. No chest pain or SOB. Seems to be related to standing at times. No prior cardiac issues.   Primary Care Physician: Beverely Low  Last Lipid Profile:Lipid Panel     Component Value Date/Time   CHOL 116 02/26/2012 1126   TRIG 141.0 02/26/2012 1126   HDL 29.90* 02/26/2012 1126   CHOLHDL 4 02/26/2012 1126   VLDL 28.2 02/26/2012 1126   LDLCALC 58 02/26/2012 1126     Past Medical History  Diagnosis Date  . Anxiety   . Depression   . Headache(784.0)   . Psychosis   . Schizoaffective disorder   . Mania   . Obesity   . PTSD (post-traumatic stress disorder)   . High blood pressure     off meds at this time  . Diabetes mellitus     notes borderline  . Hyperlipidemia     Past Surgical History  Procedure Laterality Date  . Fracture surgery    . Cholecystectomy    . Broken ankle  2000    Current Outpatient Prescriptions  Medication Sig Dispense Refill  . cyclobenzaprine (FLEXERIL) 10 MG tablet Take 10 mg by mouth at bedtime.      Marland Kitchen FIBER PO Take by mouth.      . MELATONIN PO Take by mouth.      . meloxicam (MOBIC) 15 MG tablet Take 1 tablet (15 mg total) by mouth daily.  30 tablet  3  . Multiple Vitamin (MULTIVITAMIN) tablet Take 1 tablet by mouth daily.       No current facility-administered medications for this visit.    Allergies  Allergen Reactions  . Latex Shortness Of Breath and Rash  . Tegretol (Carbamazepine) Rash  . Vicodin (Hydrocodone-Acetaminophen) Rash    History   Social History  . Marital Status: Single    Spouse Name: N/A    Number of Children: N/A  . Years of Education: N/A   Occupational History  . Not on file.   Social History Main  Topics  . Smoking status: Never Smoker   . Smokeless tobacco: Never Used  . Alcohol Use: No  . Drug Use: No  . Sexually Active: Not Currently   Other Topics Concern  . Not on file   Social History Narrative  . No narrative on file    Family History  Problem Relation Age of Onset  . Alcohol abuse Mother   . Depression Mother   . Alcohol abuse Brother   . Suicidality Maternal Grandmother   . Anorexia nervosa Maternal Grandmother   . Asperger's syndrome Daughter   . Schizophrenia Father   . Brain cancer Father   . Early death Father     Review of Systems:  As stated in the HPI and otherwise negative.   BP 130/76  Pulse 78  Ht 5' 8.5" (1.74 m)  Wt 346 lb 1.9 oz (156.999 kg)  BMI 51.86 kg/m2  SpO2 98%  Physical Examination: General: Well developed, well nourished, NAD HEENT: OP clear, mucus membranes moist SKIN: warm, dry. No rashes. Neuro: No focal deficits Musculoskeletal: Muscle strength 5/5 all ext Psychiatric: Mood and affect normal Neck: No JVD, no carotid bruits, no thyromegaly, no lymphadenopathy. Lungs:Clear bilaterally,  no wheezes, rhonci, crackles Cardiovascular: Regular rate and rhythm. No murmurs, gallops or rubs. Abdomen:Soft. Bowel sounds present. Non-tender.  Extremities: No lower extremity edema. Pulses are 2 + in the bilateral DP/PT.  EKG: NSR, rate 78 bpm. Incomplete RBBB.   Assessment and Plan:   1. Dizziness: EKG with incomplete RBBB which should not be contributory to her symptoms. Likely related to her neurological issues. Will arrange echo to assess LVEF and exclude structural heart disease. Follow up prn.

## 2012-12-31 ENCOUNTER — Other Ambulatory Visit: Payer: Self-pay

## 2013-01-04 ENCOUNTER — Ambulatory Visit (INDEPENDENT_AMBULATORY_CARE_PROVIDER_SITE_OTHER): Payer: Medicare Other | Admitting: Neurology

## 2013-01-04 DIAGNOSIS — G932 Benign intracranial hypertension: Secondary | ICD-10-CM

## 2013-01-04 DIAGNOSIS — G4761 Periodic limb movement disorder: Secondary | ICD-10-CM

## 2013-01-04 DIAGNOSIS — G4733 Obstructive sleep apnea (adult) (pediatric): Secondary | ICD-10-CM

## 2013-01-08 ENCOUNTER — Ambulatory Visit (HOSPITAL_COMMUNITY): Payer: Medicare Other | Attending: Cardiovascular Disease | Admitting: Radiology

## 2013-01-08 DIAGNOSIS — E785 Hyperlipidemia, unspecified: Secondary | ICD-10-CM | POA: Insufficient documentation

## 2013-01-08 DIAGNOSIS — R9431 Abnormal electrocardiogram [ECG] [EKG]: Secondary | ICD-10-CM | POA: Diagnosis not present

## 2013-01-08 DIAGNOSIS — I452 Bifascicular block: Secondary | ICD-10-CM | POA: Insufficient documentation

## 2013-01-08 DIAGNOSIS — I4510 Unspecified right bundle-branch block: Secondary | ICD-10-CM

## 2013-01-08 DIAGNOSIS — R42 Dizziness and giddiness: Secondary | ICD-10-CM | POA: Diagnosis not present

## 2013-01-08 DIAGNOSIS — I10 Essential (primary) hypertension: Secondary | ICD-10-CM | POA: Insufficient documentation

## 2013-01-08 DIAGNOSIS — E119 Type 2 diabetes mellitus without complications: Secondary | ICD-10-CM | POA: Insufficient documentation

## 2013-01-08 NOTE — Progress Notes (Signed)
Echocardiogram performed.  

## 2013-01-13 ENCOUNTER — Telehealth: Payer: Self-pay | Admitting: Neurology

## 2013-01-13 DIAGNOSIS — G4733 Obstructive sleep apnea (adult) (pediatric): Secondary | ICD-10-CM

## 2013-01-13 NOTE — Telephone Encounter (Signed)
Please call and notify patient that the recent sleep study confirmed the diagnosis of OSA. She did adequately well with CPAP during the study with significant improvement of the respiratory events. Therefore, I would like start the patient on CPAP at home. I placed the order in the chart.   Arrange for CPAP set up at home through a DME company of patient's choice and fax/route report to PCP and referring MD (if other than PCP).   The patient will also need a follow up appointment with me in 6-8 weeks post set up that has to be scheduled; help the patient schedule this (in a follow-up slot).   Please re-enforce the importance of compliance with treatment and the need for [place] to monitor compliance data.   Once you have spoken to the patient and scheduled the return appointment, you may close this encounter, thanks,   Huston Foley, MD, PhD [facility] ([facility]

## 2013-01-18 ENCOUNTER — Encounter: Payer: Self-pay | Admitting: Neurology

## 2013-01-18 NOTE — Telephone Encounter (Signed)
Pt is aware of results, CPAP order and instructions submitted to DME.

## 2013-02-01 ENCOUNTER — Ambulatory Visit (INDEPENDENT_AMBULATORY_CARE_PROVIDER_SITE_OTHER): Payer: Medicare Other | Admitting: Licensed Clinical Social Worker

## 2013-02-01 DIAGNOSIS — F848 Other pervasive developmental disorders: Secondary | ICD-10-CM

## 2013-02-01 DIAGNOSIS — F845 Asperger's syndrome: Secondary | ICD-10-CM

## 2013-02-01 NOTE — Progress Notes (Signed)
   THERAPIST PROGRESS NOTE  Session Time: 11:30am-12:20pm  Participation Level: Active  Behavioral Response: Well GroomedAlertAnxious  Type of Therapy: Individual Therapy  Treatment Goals addressed: Coping  Interventions: CBT, Motivational Interviewing, Strength-based, Supportive and Reframing  Summary: Michaela Norris is a 37 y.o. female who presents with euthymic mood and anxious affect. She reports that a lot has happened since her last visit. She has been diagnosed with severe sleep apnea and reports that she stops breathing 61 times per hour. She feels relief to finally have answers to explain her chronic fatigue. She is trying to get used to a CPAP machine, but is committed to this. She processes her ongoing frustration with adapting to her daughter having Celiac disease. She is upset that her daughter has been denied disability and she questions if she should just allow her daughter to stay home and "be little" until she grows up or continues to put her in world which is difficult for her to navigate. Her sleep is improving and her appetite is wnl.   Suicidal/Homicidal: Nowithout intent/plan  Therapist Response: Assessed patients current functioning and reviewed progress. Reviewed coping strategies. Assessed patients safety and assisted in identifying protective factors.  Reviewed crisis plan with patient. Assisted patient with the expression of frustration. Reviewed patients self care plan. Assessed progress related to self care. Patients self care is good. Recommend daily exercise, increased socialization and recreation. Used CBT to assist patient with the identification of negative distortions and irrational thoughts. Encouraged patient to verbalize alternative and factual responses which challenge thought distortions. Used motivational interviewing to assist and encourage patient through the change process. Explored patients barriers to change.   Plan: Return again in one  weeks.  Diagnosis: Axis I: Asperger's Disorder    Axis II: No diagnosis    Leston Schueller, LCSW 02/01/2013

## 2013-02-08 ENCOUNTER — Ambulatory Visit (HOSPITAL_COMMUNITY): Payer: Self-pay | Admitting: Licensed Clinical Social Worker

## 2013-02-15 ENCOUNTER — Ambulatory Visit (INDEPENDENT_AMBULATORY_CARE_PROVIDER_SITE_OTHER): Payer: Medicare Other | Admitting: Licensed Clinical Social Worker

## 2013-02-15 DIAGNOSIS — F845 Asperger's syndrome: Secondary | ICD-10-CM

## 2013-02-15 DIAGNOSIS — F848 Other pervasive developmental disorders: Secondary | ICD-10-CM

## 2013-02-15 NOTE — Progress Notes (Signed)
   THERAPIST PROGRESS NOTE  Session Time: 2:00pm-2:50pm  Participation Level: Active  Behavioral Response: Well GroomedAlertAnxious and Euthymic  Type of Therapy: Individual Therapy  Treatment Goals addressed: Coping  Interventions: CBT, Strength-based, Supportive and Reframing  Summary: Michaela Norris is a 37 y.o. female who presents with euthymic mood and anxious affect. She reports improvement in her mood over the past two weeks. She feels better physically now that she has started eating meat again. She processes her frustration with school, her lack of motivation and her anxiety about writing her thesis. She reports that it is typically difficult for her to see how and where she has made improvement over the years, but she is pleased that she can see that now. She is socializing more and has joined a Web designer group. Her sleep and appetite are wnl.   Suicidal/Homicidal: Nowithout intent/plan  Therapist Response: Assessed patients current functioning and reviewed progress. Reviewed coping strategies. Assessed patients safety and assisted in identifying protective factors.  Reviewed crisis plan with patient. Assisted patient with the expression of frustration. Reviewed patients self care plan. Assessed progress related to self care. Patients self care is good. Recommend daily exercise, increased socialization and recreation. Used CBT to assist patient with the identification of negative distortions and irrational thoughts. Encouraged patient to verbalize alternative and factual responses which challenge thought distortions. Used motivational interviewing to assist and encourage patient through the change process. Explored patients barriers to change.   Plan: Return again in one weeks.  Diagnosis: Axis I: Asperger's Disorder    Axis II: No diagnosis    Judythe Postema, LCSW 02/15/2013

## 2013-02-17 ENCOUNTER — Telehealth: Payer: Self-pay | Admitting: Neurology

## 2013-02-19 NOTE — Telephone Encounter (Signed)
Returned patients call. No answer.

## 2013-02-24 ENCOUNTER — Encounter: Payer: Self-pay | Admitting: Neurology

## 2013-03-01 ENCOUNTER — Ambulatory Visit (INDEPENDENT_AMBULATORY_CARE_PROVIDER_SITE_OTHER): Payer: Medicare Other | Admitting: Licensed Clinical Social Worker

## 2013-03-01 DIAGNOSIS — F848 Other pervasive developmental disorders: Secondary | ICD-10-CM

## 2013-03-01 DIAGNOSIS — F845 Asperger's syndrome: Secondary | ICD-10-CM

## 2013-03-01 NOTE — Progress Notes (Signed)
   THERAPIST PROGRESS NOTE  Session Time: 3:00pm-3:50pm  Participation Level: Active  Behavioral Response: Well GroomedAlertAnxious  Type of Therapy: Individual Therapy  Treatment Goals addressed: Coping  Interventions: CBT, Solution Focused, Strength-based, Supportive and Reframing  Summary: Michaela Norris is a 37 y.o. female who presents with euthymic mood and anxious affect. She wants to discuss the possibility of moving to [place] and questions her intentions. She questions if moving there is running away from her problems. Discussed in detail patients unhappiness in [place], how she is negatively impacted by her family and how they support distorted thinking. She is eager to move but naturally anxious. Explored her options and fears thoroughly.    Suicidal/Homicidal: Nowithout intent/plan  Therapist Response: Assessed patients current functioning and reviewed progress. Reviewed coping strategies. Assessed patients safety and assisted in identifying protective factors.  Reviewed crisis plan with patient. Assisted patient with the expression of anxiety over moving. Reviewed patients self care plan. Assessed progress related to self care. Patients self care is good. Recommend daily exercise, increased socialization and recreation. Used CBT to assist patient with the identification of negative distortions and irrational thoughts. Encouraged patient to verbalize alternative and factual responses which challenge thought distortions. Reviewed healthy boundaries and assertive communication.   Plan: Return again in one weeks.  Diagnosis: Axis I: Asperger's Disorder    Axis II: No diagnosis    Jacyln Carmer, LCSW 03/01/2013

## 2013-03-05 ENCOUNTER — Telehealth: Payer: Self-pay | Admitting: Neurology

## 2013-03-08 ENCOUNTER — Encounter: Payer: Self-pay | Admitting: Family Medicine

## 2013-03-08 ENCOUNTER — Ambulatory Visit (INDEPENDENT_AMBULATORY_CARE_PROVIDER_SITE_OTHER): Payer: Medicare Other | Admitting: Family Medicine

## 2013-03-08 ENCOUNTER — Ambulatory Visit (INDEPENDENT_AMBULATORY_CARE_PROVIDER_SITE_OTHER): Payer: Medicare Other | Admitting: Licensed Clinical Social Worker

## 2013-03-08 VITALS — BP 118/66 | HR 89 | Temp 98.1°F | Resp 16 | Wt 344.4 lb

## 2013-03-08 DIAGNOSIS — H8110 Benign paroxysmal vertigo, unspecified ear: Secondary | ICD-10-CM

## 2013-03-08 DIAGNOSIS — F845 Asperger's syndrome: Secondary | ICD-10-CM

## 2013-03-08 DIAGNOSIS — F848 Other pervasive developmental disorders: Secondary | ICD-10-CM

## 2013-03-08 MED ORDER — MECLIZINE HCL 50 MG PO TABS: 50.0000 mg | ORAL_TABLET | Freq: Three times a day (TID) | ORAL | Status: DC | PRN

## 2013-03-08 NOTE — Progress Notes (Signed)
  Subjective:    Patient ID: Michaela Norris, female    DOB: 01-09-1976, 37 y.o.   MRN: [id number]  HPI Vertigo- no hx of similar.  Started 1 week ago.  Occuring w/ 'any type of change in position'.  Has been waking her from sleep when she rolls over.  sxs will occur w/ turning head, lying down.  + nausea.  No vomiting.  No ear pain.  + frontal pressure.   Review of Systems For ROS see HPI     Objective:   Physical Exam  Vitals reviewed. Constitutional: She is oriented to person, place, and time. She appears well-developed and well-nourished. No distress.  HENT:  Head: Normocephalic and atraumatic.  Mouth/Throat: Uvula is midline and mucous membranes are normal.  TMs WNL No TTP over sinuses Minimal nasal congestion  Eyes: Conjunctivae and EOM are normal. Pupils are equal, round, and reactive to light.  2-3 beats of horizontal nystagmus  Neck: Normal range of motion. Neck supple.  Cardiovascular: Normal rate, regular rhythm, normal heart sounds and intact distal pulses.   Pulmonary/Chest: Effort normal and breath sounds normal. No respiratory distress. She has no wheezes. She has no rales.  Musculoskeletal: She exhibits no edema.  Lymphadenopathy:    She has no cervical adenopathy.  Neurological: She is alert and oriented to person, place, and time. She has normal reflexes. No cranial nerve deficit.  + Gilberto Better  Skin: Skin is warm and dry.  Psychiatric: She has a normal mood and affect. Her behavior is normal. Judgment and thought content normal.          Assessment & Plan:

## 2013-03-08 NOTE — Progress Notes (Signed)
   THERAPIST PROGRESS NOTE  Session Time: 1:00pm-1:50pm  Participation Level: Active  Behavioral Response: Well GroomedAlertAnxious  Type of Therapy: Individual Therapy  Treatment Goals addressed: Anxiety and Coping  Interventions: CBT, Strength-based, Supportive and Reframing  Summary: Michaela Norris is a 37 y.o. female who presents with anxious mood and affect. She questions if her decision to move to [facility] because of her love of Michaela Norris is irrational. She reports feeling obsessed over Michaela Norris, more than she should. She reports that people in her life tell her to stop talking about it. She is able to process why this move is healthy for both her and her daughter. She is disappointed that others do not support her decision. Michaela [place] is the only place she has been able to experience joy. Patient will have a spinal tap tomorrow. Processed her anxiety. Her sleep and appetite are wnl.   Suicidal/Homicidal: Nowithout intent/plan  Therapist Response: Assessed patients current functioning and reviewed progress. Reviewed coping strategies. Assessed patients safety and assisted in identifying protective factors.  Reviewed crisis plan with patient. Assisted patient with the expression of anxiety and self doubt. Reviewed patients self care plan. Assessed progress related to self care. Patients self care is good. Recommend daily exercise, increased socialization and recreation. Used CBT to assist patient with the identification of negative distortions and irrational thoughts. Encouraged patient to verbalize alternative and factual responses which challenge thought distortions. Reviewed healthy boundaries and assertive communication.   Plan: Return again in one weeks.  Diagnosis: Axis I: Asperger's Disorder    Axis II: No diagnosis    Loura Pitt, LCSW 03/08/2013

## 2013-03-08 NOTE — Patient Instructions (Signed)
This is called positional vertigo Continue to drink plenty of fluids Change positions slowly Use the Meclizine as needed for the spins (particularly before bed) Try the Eppley Maneuver to de-sensitize your inner ears Hang in there!

## 2013-03-09 ENCOUNTER — Ambulatory Visit
Admission: RE | Admit: 2013-03-09 | Discharge: 2013-03-09 | Disposition: A | Discharge status: Home / Self Care | Payer: Medicare Other | Source: Ambulatory Visit | Attending: Neurology | Admitting: Neurology

## 2013-03-09 VITALS — BP 106/42 | HR 64 | Ht 68.0 in | Wt 344.0 lb

## 2013-03-09 DIAGNOSIS — D649 Anemia, unspecified: Secondary | ICD-10-CM

## 2013-03-09 DIAGNOSIS — M25551 Pain in right hip: Secondary | ICD-10-CM

## 2013-03-09 DIAGNOSIS — F259 Schizoaffective disorder, unspecified: Secondary | ICD-10-CM

## 2013-03-09 DIAGNOSIS — Z01419 Encounter for gynecological examination (general) (routine) without abnormal findings: Secondary | ICD-10-CM

## 2013-03-09 DIAGNOSIS — F845 Asperger's syndrome: Secondary | ICD-10-CM

## 2013-03-09 DIAGNOSIS — F319 Bipolar disorder, unspecified: Secondary | ICD-10-CM | POA: Diagnosis not present

## 2013-03-09 DIAGNOSIS — Z124 Encounter for screening for malignant neoplasm of cervix: Secondary | ICD-10-CM

## 2013-03-09 DIAGNOSIS — H8110 Benign paroxysmal vertigo, unspecified ear: Secondary | ICD-10-CM

## 2013-03-09 DIAGNOSIS — R7309 Other abnormal glucose: Secondary | ICD-10-CM | POA: Diagnosis not present

## 2013-03-09 DIAGNOSIS — G932 Benign intracranial hypertension: Secondary | ICD-10-CM | POA: Diagnosis not present

## 2013-03-09 DIAGNOSIS — R141 Gas pain: Secondary | ICD-10-CM

## 2013-03-09 DIAGNOSIS — E785 Hyperlipidemia, unspecified: Secondary | ICD-10-CM | POA: Diagnosis not present

## 2013-03-09 DIAGNOSIS — R7303 Prediabetes: Secondary | ICD-10-CM

## 2013-03-09 DIAGNOSIS — R42 Dizziness and giddiness: Secondary | ICD-10-CM

## 2013-03-09 DIAGNOSIS — F3130 Bipolar disorder, current episode depressed, mild or moderate severity, unspecified: Secondary | ICD-10-CM | POA: Diagnosis not present

## 2013-03-09 DIAGNOSIS — G4733 Obstructive sleep apnea (adult) (pediatric): Secondary | ICD-10-CM

## 2013-03-09 DIAGNOSIS — Z6841 Body Mass Index (BMI) 40.0 and over, adult: Secondary | ICD-10-CM | POA: Diagnosis not present

## 2013-03-09 DIAGNOSIS — M2550 Pain in unspecified joint: Secondary | ICD-10-CM

## 2013-03-09 DIAGNOSIS — M25552 Pain in left hip: Secondary | ICD-10-CM

## 2013-03-09 LAB — CSF CELL COUNT WITH DIFFERENTIAL
RBC Count, CSF: 0 uL
RBC Count, CSF: 0 cu mm
Tube #: 4
WBC, CSF: 0 uL (ref 0–5)
WBC, CSF: 0 cu mm (ref 0–5)

## 2013-03-09 LAB — GLUCOSE, CSF: Glucose, CSF: 66 mg/dL (ref 43–76)

## 2013-03-09 LAB — PROTEIN, CSF: Total Protein, CSF: 27 mg/dL (ref 15–45)

## 2013-03-09 NOTE — Progress Notes (Signed)
One tiger-topped tube of blood drawn for procedure; from left [facility] space without difficulty; site unremarkable.  dd

## 2013-03-10 LAB — VDRL, CSF

## 2013-03-11 NOTE — Assessment & Plan Note (Signed)
New.  Start Antivert.  Modified Epply maneuvers printed and given to pt.  Has appt w/ neuro upcoming.  Reviewed supportive care and red flags that should prompt return.  Pt expressed understanding and is in agreement w/ plan.

## 2013-03-12 LAB — CSF CULTURE

## 2013-03-12 LAB — CSF CULTURE W GRAM STAIN: Organism ID, Bacteria: NO GROWTH

## 2013-03-13 LAB — OLIGOCLONAL BANDS, CSF + SERM

## 2013-03-15 LAB — VIRAL CULTURE VIRC: Organism ID, Bacteria: NEGATIVE

## 2013-03-18 ENCOUNTER — Ambulatory Visit (INDEPENDENT_AMBULATORY_CARE_PROVIDER_SITE_OTHER): Payer: Medicare Other | Admitting: Neurology

## 2013-03-18 ENCOUNTER — Encounter: Payer: Self-pay | Admitting: Neurology

## 2013-03-18 VITALS — BP 123/61 | HR 73 | Temp 98.1°F | Ht 68.5 in | Wt 348.0 lb

## 2013-03-18 DIAGNOSIS — J3489 Other specified disorders of nose and nasal sinuses: Secondary | ICD-10-CM | POA: Diagnosis not present

## 2013-03-18 DIAGNOSIS — G932 Benign intracranial hypertension: Secondary | ICD-10-CM | POA: Diagnosis not present

## 2013-03-18 DIAGNOSIS — G4733 Obstructive sleep apnea (adult) (pediatric): Secondary | ICD-10-CM | POA: Diagnosis not present

## 2013-03-18 DIAGNOSIS — R0981 Nasal congestion: Secondary | ICD-10-CM

## 2013-03-18 HISTORY — DX: Obstructive sleep apnea (adult) (pediatric): G47.33

## 2013-03-18 MED ORDER — ACETAZOLAMIDE ER 500 MG PO CP12: ORAL_CAPSULE | ORAL | Status: DC

## 2013-03-18 MED ORDER — FLUTICASONE PROPIONATE 50 MCG/ACT NA SUSP: 1.0000 | Freq: Every day | NASAL | Status: DC

## 2013-03-18 NOTE — Patient Instructions (Addendum)
I think overall you are doing fairly well but I do want to suggest a few things today:  You have obstructive sleep apnea, and you have pseudotumor cerebri.   Please continue using your CPAP regularly. While your insurance requires that you use CPAP at least 4 hours each night on 70% of the nights, I recommend, that you not skip any nights and use it throughout the night if you can. Getting used to CPAP does take time and patience and discipline. Untreated obstructive sleep apnea when it is moderate to severe can have an adverse impact on cardiovascular health and raise her risk for heart disease, arrhythmias, hypertension, congestive heart failure, stroke and diabetes. Untreated obstructive sleep apnea causes sleep disruption, nonrestorative sleep, and sleep deprivation. This can have an impact on your day to day functioning and cause daytime sleepiness and impairment of cognitive function, memory loss, mood disturbance, and problems focussing. Using CPAP regularly can improve these symptoms.  Please remember, common headache triggers are: sleep deprivation, dehydration, overheating, stress, hypoglycemia or skipping meals and blood sugar fluctuations, excessive pain medications or excessive alcohol use or caffeine withdrawal. Some people have food triggers such as aged cheese, orange juice or chocolate, especially dark chocolate, or MSG (monosodium glutamate). Try to avoid these headache triggers as much possible. It may be helpful to keep a headache diary to figure out what makes your headaches worse or brings them on and what alleviates them. Some people report headache onset after exercise but studies have shown that regular exercise may actually prevent headaches from coming. If you have exercise-induced headaches, please make sure that you drink plenty of fluid before and after exercising and that you do not over do it and do not overheat.  Remember to drink plenty of fluid, eat healthy meals and do not  skip any meals. Try to eat protein with a every meal and eat a healthy snack such as fruit or nuts in between meals. Try to keep a regular sleep-wake schedule and try to exercise daily, particularly in the form of walking, 20-30 minutes a day, if you can.   As far as your medications are concerned, I would like to suggest a trial of Diamox (generic name: acetazolamide) 500 mg strength: Take one pill each night at bedtime for 1 week, then 1 pill twice daily thereafter. Common side effects reported are: Dizziness, lightheadedness, increase in urine output, blurry vision, dry mouth, drowsiness, loss of appetite, upset stomach, headache and tiredness, tingling in the hands and feet and change in taste, especially with carbonated sodas. Most side effects are transient especially during the first few days as the body adjusts to the medication.    Start using flonase nasal spray each night for now to help reduce your nasal congestion.   As far as diagnostic testing: no new test ordered for today.   I would like to see you back in 3 months, sooner if we need to. Please call us with any interim questions, concerns, problems, updates or refill requests.  Brett Canales is my clinical assistant and will answer any of your questions and relay your messages to me and also relay most of my messages to you.  Our phone number is [phone]. We also have an after hours call service for urgent matters and there is a physician on-call for urgent questions. For any emergencies you know to call 911 or go to the nearest emergency room.

## 2013-03-18 NOTE — Progress Notes (Signed)
Subjective:    Patient ID: Michaela Norris is a 37 y.o. female.  HPI  Interim history:    Michaela Norris is a 37 year old right-handed woman with an underlying medical history of morbid obesity, Asperger's syndrome, schizo-affective d/o, vertigo, and low back pain who presents for followup consultation of her recurrent headaches in the contex to pseudotumor cerebri as well as obstructive sleep apnea. She is unaccompanied today. I first met her on 12/16/2012, at which time I suggested an ophthalmology referral, and a lumbar puncture under fluoroscopic guidance as well as a sleep study based on concern for obstructive sleep apnea.  She had her LP on 03/09/2013: The opening pressure was elevated at 38.5 cm water. Following removal of 34 mL of clear CSF, the closing pressure was 11.5 cm water. All lab results routinely done on the CSF were negative.  She had a split-night sleep study on 01/04/2013 and I went over her test results with her in detail today. Her baseline sleep efficiency was reduced at 55.4% with a latency to sleep of 17 minutes and wake after sleep onset of 45.5 minutes with moderate sleep fragmentation noted. She had any increased percentage of stage I and stage II sleep, normal percentage of deep sleep, and absence of REM sleep prior to CPAP initiation. Her total AHI was 34.1 per hour. Her baseline oxygen saturation was 92%, her nadir was 86%. She was then titrated on CPAP from 7-11 cm of water pressure and on the final pressure had a residual AHI of 0 per hour, with EPR of 2 for comfort. She was noted to have periodic leg movements of sleep prior to CPAP at 12.4 per hour but no significant arousals from these. Based on the test results I ordered CPAP for her. She did not bring her machine today. She complains of nasal stuffiness and pressure on her face and nose from the mask. Generally speaking, she uses CPAP every night, but skipped last night. She has adjusted to it fairly well, could not  tolerate the FFM and overall feels, she sleeps better.  She saw another ophthalmologist recently at [facility] ophthalmology, and reports she was told, there is no papilledema. I do not have a report available for review and have asked her to check her records as to where she went and who she saw so we can request a copy of the office note.  She recently had a bout of vertigo recently and she wonders if this is related to the CPAP. It improved after a few days and resolved after a week.  She was diagnosed with probable PTC perhaps 5 years ago, by an ophthalmologist at the time, who found papilledema. She saw 2 other neurologists in our office in the past and was prescribed Diamox by Dr. Vickey Huger in our office and was last seen by Dr. Vickey Huger in 5/11, at which time she still had not had her LP due to not showing up for the tap and claiming she felt better. She had MRI/MRA/MRV in the past which were negative and she felt better after coming off of Lithium in 2012.  She describes a periorbital pain, no visual blurriness, except in the dark and she has mild pulsatile tinnitus and c/o lightheadedness. She had SE on Diamox d/t numbness in her hands and feet. She saw an ophthalmologist last year who did not find evidence of Papilledema. She endorsed snoring and has had choking sensations or dreams of breathing pauses and often wakes up in a panic and sweaty.  She reported EDS.   Her Past Medical History Is Significant For: Past Medical History  Diagnosis Date  . Anxiety   . Depression   . Headache(784.0)   . Psychosis   . Schizoaffective disorder   . Mania   . Obesity   . PTSD (post-traumatic stress disorder)   . High blood pressure     off meds at this time  . Diabetes mellitus     notes borderline  . Hyperlipidemia     Her Past Surgical History Is Significant For: Past Surgical History  Procedure Laterality Date  . Fracture surgery    . Cholecystectomy    . Broken ankle  2000    Her Family  History Is Significant For: Family History  Problem Relation Age of Onset  . Alcohol abuse Mother   . Depression Mother   . Alcohol abuse Brother   . Suicidality Maternal Grandmother   . Anorexia nervosa Maternal Grandmother   . Asperger's syndrome Daughter   . Schizophrenia Father   . Brain cancer Father   . Early death Father     Her Social History Is Significant For: History   Social History  . Marital Status: Single    Spouse Name: N/A    Number of Children: N/A  . Years of Education: N/A   Social History Main Topics  . Smoking status: Never Smoker   . Smokeless tobacco: Never Used  . Alcohol Use: No  . Drug Use: No  . Sexual Activity: Not Currently   Other Topics Concern  . None   Social History Narrative  . None    Her Allergies Are:  Allergies  Allergen Reactions  . Latex Itching and Rash  . Penicillins Swelling    Swelling in hands; states she still takes it as needed  . Tegretol [Carbamazepine] Rash  . Vicodin [Hydrocodone-Acetaminophen] Rash  :   Her Current Medications Are:  Outpatient Encounter Prescriptions as of 03/18/2013  Medication Sig Dispense Refill  . cyclobenzaprine (FLEXERIL) 10 MG tablet Take 10 mg by mouth at bedtime.      Marland Kitchen FIBER PO Take by mouth.      . meclizine (ANTIVERT) 50 MG tablet Take 1 tablet (50 mg total) by mouth 3 (three) times daily as needed.  60 tablet  0  . MELATONIN PO Take by mouth.      . meloxicam (MOBIC) 15 MG tablet Take 1 tablet (15 mg total) by mouth daily.  30 tablet  3  . Multiple Vitamin (MULTIVITAMIN) tablet Take 1 tablet by mouth daily.       No facility-administered encounter medications on file as of 03/18/2013.  :  Review of Systems:  Out of a complete 14 point review of systems, all are reviewed and negative with the exception of these symptoms as listed below:   Review of Systems  Constitutional: Negative.   HENT: Positive for rhinorrhea.   Eyes: Positive for pain.  Respiratory: Negative.    Cardiovascular: Negative.   Gastrointestinal: Negative.   Endocrine:       Flushing  Genitourinary: Negative.   Skin: Negative.   Allergic/Immunologic: Negative.   Neurological: Positive for dizziness and headaches.  Hematological:       Anemia  Psychiatric/Behavioral: The patient is nervous/anxious.     Objective:  Neurologic Exam  Physical Exam Physical Examination:   Filed Vitals:   03/18/13 1032  BP: 123/61  Pulse: 73  Temp: 98.1 F (36.7 C)    General Examination: The patient  is a very pleasant 37 y.o. female in no acute distress. She is morbidly obese.   HEENT: Normocephalic, atraumatic, pupils are equal, round and reactive to light and accommodation. Funduscopic exam is normal with sharp disc margins noted. Extraocular tracking is good without limitation to gaze excursion or nystagmus noted. Normal smooth pursuit is noted. Hearing is grossly intact. Tympanic membranes are clear bilaterally. Face is symmetric with normal facial animation and normal facial sensation. Speech is clear with no dysarthria noted. There is no hypophonia. There is no lip, neck/head, jaw or voice tremor. Neck is supple with full range of passive and active motion. There are no carotid bruits on auscultation. Oropharynx exam reveals: mild mouth dryness, adequate dental hygiene and moderate airway crowding, due to narrow airway and tonsillar size of 2+. Mallampati is class II. Tongue protrudes centrally and palate elevates symmetrically. Neck size is enlarged. Nasal inspection reveals mild mucosal bogginess and mild erythema.   Chest: Clear to auscultation without wheezing, rhonchi or crackles noted.  Heart: S1+S2+0, regular and normal without murmurs, rubs or gallops noted.   Abdomen: Soft, non-tender and non-distended with normal bowel sounds appreciated on auscultation.  Extremities: There is no pitting edema in the distal lower extremities bilaterally. Pedal pulses are intact.  Skin: Warm and  dry without trophic changes noted. There are no varicose veins.  Musculoskeletal: exam reveals no obvious joint deformities, tenderness or joint swelling or erythema.   Neurologically:  Mental status: The patient is awake, alert and oriented in all 4 spheres. Her memory, attention, language and knowledge are appropriate. There is no aphasia, agnosia, apraxia or anomia. Speech is clear with normal prosody and enunciation. Thought process is linear. Mood is congruent and affect is blunted and constricted.  Cranial nerves are as described above under HEENT exam. In addition, shoulder shrug is normal with equal shoulder height noted. Motor exam: Normal bulk, strength and tone is noted. There is no drift, tremor or rebound. Romberg is negative. Reflexes are 1+ throughout. Fine motor skills are intact with normal finger taps, normal hand movements, normal rapid alternating patting, normal foot taps and normal foot agility.  Cerebellar testing shows no dysmetria or intention tremor on finger to nose testing. Heel to shin is unremarkable bilaterally. There is no truncal or gait ataxia.  Sensory exam is intact to light touch.  Gait, station and balance are unremarkable, except for slow in movement d/t body habitus. She turns en bloc. Tandem walk is difficult for her. Intact toe stance is noted, but has difficulty with heel stance.               Assessment and Plan:    In summary, Michaela Norris is a 37 year old female with a history of PTC. She also has obstructive sleep apnea (OSA), confirmed by a recent sleep study. I had a long chat with the patient again about her condition and the diagnoses of obesity, PTC and OSA. She is willing to try diamox again, aware of potential similar SEs that she had in the past. Alternatively, we can try topamax, but I worry about potential cognitive SEs - she is in grad school, finishing a masters degree.  I suggested Diamox 500 mg: 1 pill nightly at bedtime for 1 week, then  1 pill twice daily thereafter. We talked about potential side effects of this medication.   While she indicates fairly good results with the use of CPAP, and fair tolerance of the pressure and mask, she has some nasal congestion. I  will try her on Flonase spray, 1 spray each nostril each night. I will request compliance data from [employer] and encouraged to continue to use CPAP regularly to help reduce cardiovascular risk.   We also talked about trying to maintaining a healthy lifestyle in general. I encouraged the patient to eat healthy, exercise daily and keep well hydrated, to keep a scheduled bedtime and wake time routine, to not skip any meals and eat healthy snacks in between meals and to have protein with every meal. I stressed the importance of regular exercise.   I answered all her questions today and the patient was in agreement with the above outlined plan. I would like to see the patient back in 3 months, sooner if the need arises and encouraged her to call with any interim questions, concerns, problems or updates and refill requests.  Most of my 40 minute visit today was spent in counseling and coordination of care, reviewing test results and reviewing medication.

## 2013-03-22 ENCOUNTER — Ambulatory Visit (INDEPENDENT_AMBULATORY_CARE_PROVIDER_SITE_OTHER): Payer: Medicare Other | Admitting: Licensed Clinical Social Worker

## 2013-03-22 DIAGNOSIS — F848 Other pervasive developmental disorders: Secondary | ICD-10-CM | POA: Diagnosis not present

## 2013-03-22 DIAGNOSIS — F845 Asperger's syndrome: Secondary | ICD-10-CM

## 2013-03-22 NOTE — Progress Notes (Signed)
   THERAPIST PROGRESS NOTE  Session Time: 2:00pm-2:50pm  Participation Level: Active  Behavioral Response: Well GroomedAlertAnxious  Type of Therapy: Individual Therapy  Treatment Goals addressed: Coping  Interventions: CBT, Strength-based, Supportive and Reframing  Summary: Michaela Norris is a 37 y.o. female who presents with anxious mood and affect. She reports increased anxiety about her decision to move to [place]. She questions if she is doing the right thing because her daughter may have a job in [place]. She processes her fears over staying in [place] and leaving. She is able to clearly articulate the dangers of remaining in [place], how she feels stifled by her family and how this impacts her ability to function. Many concerns are natural anxiety expected when one is contemplating relocation. Her sleep and appetite are wnl.  Suicidal/Homicidal: Nowithout intent/plan  Therapist Response: Assessed patients current functioning and reviewed progress. Reviewed coping strategies. Assessed patients safety and assisted in identifying protective factors.  Reviewed crisis plan with patient. Assisted patient with the expression of anxiety. Reviewed patients self care plan. Assessed progress related to self care. Patients self care is good. Recommend daily exercise, increased socialization and recreation. Used CBT to assist patient with the identification of negative distortions and irrational thoughts. Encouraged patient to verbalize alternative and factual responses which challenge thought distortions. Used motivational interviewing to assist and encourage patient through the change process. Explored patients barriers to change.   Plan: Return again in one weeks.  Diagnosis: Axis I: Asperger's Disorder    Axis II: No diagnosis    Majestic Molony, LCSW 03/22/2013

## 2013-03-24 ENCOUNTER — Encounter: Payer: Self-pay | Admitting: Neurology

## 2013-03-29 ENCOUNTER — Ambulatory Visit (INDEPENDENT_AMBULATORY_CARE_PROVIDER_SITE_OTHER): Payer: Medicare Other | Admitting: Licensed Clinical Social Worker

## 2013-03-29 DIAGNOSIS — F848 Other pervasive developmental disorders: Secondary | ICD-10-CM | POA: Diagnosis not present

## 2013-03-29 DIAGNOSIS — F845 Asperger's syndrome: Secondary | ICD-10-CM

## 2013-03-29 NOTE — Progress Notes (Signed)
   THERAPIST PROGRESS NOTE  Session Time: 2:00pm-2:50pm  Participation Level: Active  Behavioral Response: Well GroomedAlertAnxious  Type of Therapy: Individual Therapy  Treatment Goals addressed: Coping  Interventions: CBT, Motivational Interviewing, Strength-based, Supportive and Reframing  Summary: Syvilla Martin is a 37 y.o. female who presents with anxious mood and affect. She reports ongoing anxiety related to her decision to relocate to [place]. She processes recent dreams she has had and explores underlying anxiety related to leaving her family, especially her nephews.  She is giving her daughter more space and finds that her daughter is thriving. Patient is very thoughtful about her decision to move and continues to explore any anxiety she may have leading her to make the wrong decisions. Her sleep and appetite are wnl.  Suicidal/Homicidal: Nowithout intent/plan  Therapist Response: Assessed patients current functioning and reviewed progress. Reviewed coping strategies. Assessed patients safety and assisted in identifying protective factors.  Reviewed crisis plan with patient. Assisted patient with the expression of anxiety. Reviewed patients self care plan. Assessed progress related to self care. Patients self care is good. Recommend daily exercise, increased socialization and recreation. Used CBT to assist patient with the identification of negative distortions and irrational thoughts. Encouraged patient to verbalize alternative and factual responses which challenge thought distortions. Reviewed healthy boundaries and assertive communication. Used motivational interviewing to assist and encourage patient through the change process. Explored patients barriers to change.   Plan: Return again in one weeks.  Diagnosis: Axis I: Asperger's Disorder    Axis II: No diagnosis    Dionne Rossa, LCSW 03/29/2013

## 2013-04-01 ENCOUNTER — Other Ambulatory Visit: Payer: Self-pay

## 2013-04-05 ENCOUNTER — Ambulatory Visit (HOSPITAL_COMMUNITY): Payer: Self-pay | Admitting: Licensed Clinical Social Worker

## 2013-04-05 ENCOUNTER — Telehealth: Payer: Self-pay | Admitting: Neurology

## 2013-04-05 NOTE — Telephone Encounter (Signed)
Dr. Frances Furbish, this patient appears in the office today, terribly upset after receiving several phone calls and a letter from [employer] in which they are threatening to d/c her CPAP and refusing to fulfill her request for supplies because of the patient's office visit on 10/23.  When the patient came for the visit, there was no 30 day dl available for your review as had been requested of [employer] in your original CPAP order for her.  Because the dl came after her appt with you, they are now telling her that she is non-compliant.  I spoke with [employer] in the presence of the patient to try and straighten this out.  They are aware that the problem is on their end.  Unfortunately, the patient may need a repeat appt with you, this time with a dl that you can review with her and that is documented in the visit note.  I have scheduled her for 04/16/2013 - this is an fyi as to why she's back on the schedule after just having an appt with you.

## 2013-04-12 ENCOUNTER — Ambulatory Visit (HOSPITAL_COMMUNITY): Payer: Self-pay | Admitting: Licensed Clinical Social Worker

## 2013-04-12 ENCOUNTER — Encounter (HOSPITAL_COMMUNITY): Payer: Self-pay | Admitting: Licensed Clinical Social Worker

## 2013-04-12 ENCOUNTER — Telehealth (HOSPITAL_COMMUNITY): Payer: Self-pay | Admitting: Licensed Clinical Social Worker

## 2013-04-12 NOTE — Telephone Encounter (Signed)
Called patient about no show and left a message.

## 2013-04-12 NOTE — Progress Notes (Signed)
Patient ID: Michaela Norris, female   DOB: 07-26-1975, 37 y.o.   MRN: [id number] Patient was a no show no call.

## 2013-04-16 ENCOUNTER — Ambulatory Visit (INDEPENDENT_AMBULATORY_CARE_PROVIDER_SITE_OTHER): Payer: Medicare Other | Admitting: Neurology

## 2013-04-16 ENCOUNTER — Encounter: Payer: Self-pay | Admitting: Neurology

## 2013-04-16 VITALS — BP 122/73 | HR 68 | Ht 68.5 in | Wt 349.0 lb

## 2013-04-16 DIAGNOSIS — G4733 Obstructive sleep apnea (adult) (pediatric): Secondary | ICD-10-CM | POA: Diagnosis not present

## 2013-04-16 DIAGNOSIS — R0981 Nasal congestion: Secondary | ICD-10-CM

## 2013-04-16 DIAGNOSIS — G932 Benign intracranial hypertension: Secondary | ICD-10-CM | POA: Diagnosis not present

## 2013-04-16 DIAGNOSIS — J3489 Other specified disorders of nose and nasal sinuses: Secondary | ICD-10-CM

## 2013-04-16 MED ORDER — ACETAZOLAMIDE ER 500 MG PO CP12: 500.0000 mg | ORAL_CAPSULE | Freq: Two times a day (BID) | ORAL | Status: DC

## 2013-04-16 NOTE — Patient Instructions (Addendum)
Please continue using your CPAP regularly. While your insurance requires that you use CPAP at least 4 hours each night on 70% of the nights, I recommend, that you not skip any nights and use it throughout the night if you can. Getting used to CPAP does take time and patience and discipline. Untreated obstructive sleep apnea when it is moderate to severe can have an adverse impact on cardiovascular health and raise her risk for heart disease, arrhythmias, hypertension, congestive heart failure, stroke and diabetes. Untreated obstructive sleep apnea causes sleep disruption, nonrestorative sleep, and sleep deprivation. This can have an impact on your day to day functioning and cause daytime sleepiness and impairment of cognitive function, memory loss, mood disturbance, and problems focussing. Using CPAP regularly can improve these symptoms.  Keep up the good work! I will see you back in 6 months for sleep apnea check up, and if you continue to do well on CPAP I will see you once a year thereafter.   FU in 3 months. Continue diamox. We will consider topamax down the road.

## 2013-04-16 NOTE — Progress Notes (Signed)
Subjective:    Patient ID: Michaela Norris is a 37 y.o. female.  HPI    Interim history:   Michaela Norris is a 37 year old right-handed woman with an underlying medical history of morbid obesity, Asperger's syndrome, schizo-affective d/o, vertigo, and low back pain who presents for followup consultation of her recurrent headaches in the contex to pseudotumor cerebri as well as obstructive sleep apnea. She is unaccompanied today. I last saw her on 03/18/2013 at which time restarted her on Diamox for pseudotumor cerebri. I also went over diagnostic test results including the lumbar puncture results, the sleep study results, and also discussed how she had been doing on CPAP. She returns today for re-evaluation of her OSA in particular to make sure that she is compliant with treatment. She indicates full compliance with CPAP and good results with using CPAP. She sleeps better. She has less daytime somnolence. Unfortunately at the time of her last visit with me a month ago I did not have the requested compliance download data available which was supposed to be faxed to [place] by her DME company, [employer].  She has had night time panic attacks before treatment with CPAP and those have improved.  She has had more allergy Sx and her diamox is causing her numbness in tingling in her face, hands and feet. She has no blurry vision. I reviewed her compliance data from 02/21/2013 through 03/22/2013 which is a total of 30 days during which time she was fully compliant, using CPAP every day. Her percent used days greater than 4 hours was 97%, indicating excellent compliance. Her average usage for all days was 9 hours and 5 minutes. Her residual AHI was low at 36 per hour, indicating an appropriate treatment pressure of 11 cm with EPR of 3. Her leak was also not very high. I first met her on 12/16/2012, at which time I suggested an ophthalmology referral, and a lumbar puncture under fluoroscopic guidance as well as a sleep study  based on concern for obstructive sleep apnea.  She had her LP on 03/09/2013: OP was elevated at 38.5 cm water. Following removal of 34 mL of clear CSF, the CP was 11.5 cm water. All lab results routinely done on the CSF were negative.  She had a split-night sleep study on 01/04/2013 and I discussed her sleep test results with her in detail last time: In essence, baseline sleep efficiency was reduced at 55.4% with a latency to sleep of 17 minutes and wake after sleep onset of 45.5 minutes with moderate sleep fragmentation noted. She had an increased percentage of stage I and stage II sleep, a normal percentage of deep sleep, and absence of REM sleep prior to CPAP initiation. Her total AHI was 34.1 per hour. Her baseline oxygen saturation was 92%, her nadir was 86%. She was then titrated on CPAP from 7 cm to 11 cm of water pressure and on the final pressure she had a residual AHI of 0 per hour, with EPR of 2 for comfort. She was noted to have periodic leg movements of sleep prior to CPAP at 12.4 per hour but no significant arousals from these. Based on the test results I ordered CPAP for her. She did complain of nasal stuffiness and pressure on her face and nose from the mask. Generally speaking, she uses CPAP every night. She has adjusted to it fairly well, could not tolerate the FFM and overall feels, she sleeps better.  She saw another ophthalmologist recently at [facility] ophthalmology, and reports  she was told, there is no papilledema. I do not have a report available for review and have asked her to check her records as to where she went and who she saw so we can request a copy of the office note. She was told to come back in 2 years.  She recently had a bout of vertigo recently and she wonders if this is related to the CPAP. It improved after a few days and resolved after a week.  She was diagnosed with probable PTC perhaps 5 years ago, by an ophthalmologist at the time, who found papilledema. She saw 2  other neurologists in our office in the past and was prescribed Diamox by Dr. Vickey Huger in our office and was last seen by Dr. Vickey Huger in 5/11, at which time she still had not had her LP due to not showing up for the tap and claiming she felt better. She had MRI/MRA/MRV in the past which were negative and she felt better after coming off of Lithium in 2012.  She describes a periorbital pain, no visual blurriness, except in the dark and she has mild pulsatile tinnitus and c/o lightheadedness. She had SE on Diamox d/t numbness in her hands and feet. She saw an ophthalmologist last year who did not find evidence of Papilledema. She endorsed snoring and has had choking sensations or dreams of breathing pauses and often wakes up in a panic and sweaty. She reported EDS before being treated with CPAP.  Her Past Medical History Is Significant For: Past Medical History  Diagnosis Date  . Anxiety   . Depression   . Headache(784.0)   . Psychosis   . Schizoaffective disorder   . Mania   . Obesity   . PTSD (post-traumatic stress disorder)   . High blood pressure     off meds at this time  . Diabetes mellitus     notes borderline  . Hyperlipidemia   . OSA on CPAP 03/18/2013    Her Past Surgical History Is Significant For: Past Surgical History  Procedure Laterality Date  . Fracture surgery    . Cholecystectomy    . Broken ankle  2000    Her Family History Is Significant For: Family History  Problem Relation Age of Onset  . Alcohol abuse Mother   . Depression Mother   . Alcohol abuse Brother   . Suicidality Maternal Grandmother   . Anorexia nervosa Maternal Grandmother   . Asperger's syndrome Daughter   . Schizophrenia Father   . Brain cancer Father   . Early death Father     Her Social History Is Significant For: History   Social History  . Marital Status: Single    Spouse Name: N/A    Number of Children: N/A  . Years of Education: N/A   Social History Main Topics  . Smoking  status: Never Smoker   . Smokeless tobacco: Never Used  . Alcohol Use: No  . Drug Use: No  . Sexual Activity: Not Currently   Other Topics Concern  . None   Social History Narrative  . None    Her Allergies Are:  Allergies  Allergen Reactions  . Latex Itching and Rash  . Penicillins Swelling    Swelling in hands; states she still takes it as needed  . Tegretol [Carbamazepine] Rash  . Vicodin [Hydrocodone-Acetaminophen] Rash  :   Her Current Medications Are:  Outpatient Encounter Prescriptions as of 04/16/2013  Medication Sig  . acetaZOLAMIDE (DIAMOX) 500 MG capsule  Take 1 capsule (500 mg total) by mouth 2 (two) times daily. 1 pill nightly at bedtime for 1 week, then 1 pill twice daily thereafter.  . cyclobenzaprine (FLEXERIL) 10 MG tablet Take 10 mg by mouth at bedtime.  Marland Kitchen FIBER PO Take by mouth.  . fluticasone (FLONASE) 50 MCG/ACT nasal spray Place 1 spray into the nose at bedtime.  . meclizine (ANTIVERT) 50 MG tablet Take 1 tablet (50 mg total) by mouth 3 (three) times daily as needed.  Marland Kitchen MELATONIN PO Take by mouth.  . meloxicam (MOBIC) 15 MG tablet Take 1 tablet (15 mg total) by mouth daily.  . Multiple Vitamin (MULTIVITAMIN) tablet Take 1 tablet by mouth daily.  . [DISCONTINUED] acetaZOLAMIDE (DIAMOX) 500 MG capsule 1 pill nightly at bedtime for 1 week, then 1 pill twice daily thereafter.   Review of Systems:   Out of a complete 14 point review of systems, all are reviewed and negative with the exception of these symptoms as listed below:   Review of Systems  Constitutional: Positive for fatigue.  HENT: Positive for tinnitus.   Eyes: Positive for pain.  Respiratory: Negative.   Cardiovascular: Negative.   Gastrointestinal: Negative.   Endocrine:       Flushing  Genitourinary: Negative.   Musculoskeletal: Positive for arthralgias.  Skin: Negative.   Allergic/Immunologic: Positive for environmental allergies.  Neurological: Positive for dizziness and headaches.   Hematological:       Anemia  Psychiatric/Behavioral: Negative.     Objective:  Neurologic Exam  Physical Exam Physical Examination:   Filed Vitals:   04/16/13 1034  BP: 122/73  Pulse: 68   General Examination: The patient is a very pleasant 36 y.o. female in no acute distress. She is morbidly obese.   HEENT: Normocephalic, atraumatic, pupils are equal, round and reactive to light and accommodation. Funduscopic exam is normal with sharp disc margins noted. Extraocular tracking is good without limitation to gaze excursion or nystagmus noted. Normal smooth pursuit is noted. Hearing is grossly intact. Face is symmetric with normal facial animation and normal facial sensation. Speech is clear with no dysarthria noted. There is no hypophonia. There is no lip, neck/head, jaw or voice tremor. Neck is supple with full range of passive and active motion. There are no carotid bruits on auscultation. Oropharynx exam reveals: mild mouth dryness, adequate dental hygiene and moderate airway crowding, due to narrow airway and tonsillar size of 2+. Mallampati is class II. Tongue protrudes centrally and palate elevates symmetrically. Neck size is enlarged. Nasal inspection reveals mild mucosal bogginess and mild erythema.   Chest: Clear to auscultation without wheezing, rhonchi or crackles noted.  Heart: S1+S2+0, regular and normal without murmurs, rubs or gallops noted.   Abdomen: Soft, non-tender and non-distended with normal bowel sounds appreciated on auscultation.  Extremities: There is no pitting edema in the distal lower extremities bilaterally. Pedal pulses are intact.  Skin: Warm and dry without trophic changes noted. There are no varicose veins.  Musculoskeletal: exam reveals no obvious joint deformities, tenderness or joint swelling or erythema.   Neurologically:  Mental status: The patient is awake, alert and oriented in all 4 spheres. Her memory, attention, language and knowledge are  appropriate. There is no aphasia, agnosia, apraxia or anomia. Speech is clear with normal prosody and enunciation. Thought process is linear. Mood is congruent and affect is normal today.   Cranial nerves are as described above under HEENT exam. In addition, shoulder shrug is normal with equal shoulder height noted. Motor  exam: Normal bulk, strength and tone is noted. There is no drift, tremor or rebound. Romberg is negative. Reflexes are 1+ throughout. Fine motor skills are intact with normal finger taps, normal hand movements, normal rapid alternating patting, normal foot taps and normal foot agility.  Cerebellar testing shows no dysmetria or intention tremor on finger to nose testing. There is no truncal or gait ataxia.  Sensory exam is intact to light touch.  Gait, station and balance are unremarkable, except for slow in movement d/t body habitus. She turns en bloc.  Assessment and Plan:    In summary, Michaela Norris is a 37 year old female with a history of PTC. She also has obstructive sleep apnea (OSA), confirmed by a recent sleep study. She reports good results with CPAP with the exception that she has noted more allergy symptoms and some congestion. While she sleeps pattern she has not had much in the way of improvement in her headaches with CPAP she feels. Nevertheless, her she is fully compliant with therapy and I have commended her for that. She has had some trouble with her compliance card and was given a card that was not formatted and would not work with the machine. I have advised her that we will call her DME company and request that she be given a working compliance card so we can continue monitoring her usage. She is again advised to continue with regular use of CPAP, continue with Diamox, and followup with ophthalmology down the Road. Also, in the future we will consider switching her to Topamax. 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 She will be done next year and would like to see if she can take something else that Diamox because she has had paresthesias with it before and continues to experience paresthesias now. I renewed her prescription for Diamox. I would like to see her back in 3 months from now, sooner if the need arises.  We also talked about trying to maintaining a healthy lifestyle in general. I encouraged the patient to eat healthy, exercise daily and keep well hydrated, to keep a scheduled bedtime and wake time routine, to not skip any meals and eat healthy snacks in between meals and to have protein with every meal. I stressed the importance of regular exercise and weight loss.    I answered all her questions today and the patient was in agreement with the above outlined plan. I would like to see the patient back in 3 months, sooner if the need arises and encouraged her to call with any interim questions, concerns, problems or updates and refill requests.  Most of my 25 minute visit today was spent in counseling and coordination of care, reviewing test results and reviewing medication.

## 2013-04-19 ENCOUNTER — Ambulatory Visit (INDEPENDENT_AMBULATORY_CARE_PROVIDER_SITE_OTHER): Payer: Medicare Other | Admitting: Licensed Clinical Social Worker

## 2013-04-19 DIAGNOSIS — F845 Asperger's syndrome: Secondary | ICD-10-CM

## 2013-04-19 DIAGNOSIS — F848 Other pervasive developmental disorders: Secondary | ICD-10-CM | POA: Diagnosis not present

## 2013-04-19 NOTE — Progress Notes (Signed)
   THERAPIST PROGRESS NOTE  Session Time: 2:00pm-2:50pm  Participation Level: Active  Behavioral Response: Well GroomedAlertAnxious  Type of Therapy: Individual Therapy  Treatment Goals addressed: Coping  Interventions: CBT, Motivational Interviewing, Strength-based, Supportive and Reframing  Summary: Michaela Norris is a 37 y.o. female who presents with anxious mood and tearful affect. She reports a very difficult week due to jury duty. She describes increased anxiety which resulting in uncontrollable crying prior to entering the courthouse. She discusses her desire to do something "normal", to have a shared experience so she can have something to talk to others about. She now feels that she is unable to move to [place], lacks confidence in her ability to manage stress and still feels unsettled.  She demonstrates strong self doubt, endorses nightmares surrounding anxious situations and overall increased anxiety.   Suicidal/Homicidal: Nowithout intent/plan  Therapist Response: Assessed patients current functioning and reviewed progress. Reviewed coping strategies. Assessed patients safety and assisted in identifying protective factors.  Reviewed crisis plan with patient. Assisted patient with the expression of anxiety about moving and making the right decisions. Reviewed patients self care plan. Assessed progress related to self care. Patients self care is fair. Recommend daily exercise, increased socialization and recreation. Used CBT to assist patient with the identification of negative distortions and irrational thoughts. Encouraged patient to verbalize alternative and factual responses which challenge thought distortions. Used motivational interviewing to assist and encourage patient through the change process. Explored patients barriers to change.   Plan: Return again in one weeks.  Diagnosis: Axis I: Asperger's Disorder    Axis II: No diagnosis    Mccrae Speciale,  LCSW 04/19/2013

## 2013-05-05 ENCOUNTER — Ambulatory Visit (INDEPENDENT_AMBULATORY_CARE_PROVIDER_SITE_OTHER): Payer: Medicare Other | Admitting: Licensed Clinical Social Worker

## 2013-05-05 DIAGNOSIS — F848 Other pervasive developmental disorders: Secondary | ICD-10-CM | POA: Diagnosis not present

## 2013-05-05 DIAGNOSIS — F845 Asperger's syndrome: Secondary | ICD-10-CM

## 2013-05-05 NOTE — Progress Notes (Signed)
   THERAPIST PROGRESS NOTE  Session Time: 2:00pm-2:50pm  Participation Level: Active  Behavioral Response: Well GroomedAlertAnxious  Type of Therapy: Individual Therapy  Treatment Goals addressed: Anxiety, Communication: interacting in social situations, finding appropriate volunteer opportunities and Coping  Interventions: CBT, Motivational Interviewing, Strength-based, Supportive and Reframing  Summary: Michaela Norris is a 37 y.o. female who presents with anxious mood and affect. She is finished for the semester and is uncertain if she will finish her degree. She is unhappy with school and wants to move on. She is trying to work on her goal of increased socialization outside of school and reports difficulty with this. She wants to volunteer but is unable to follow through after the first phone call. She interprets a lack of response as rejection. She is also working on seperating herself from her daughter, who is maturing. She grieves this loss, but demonstrates good insight into how dangerous it would be for her to inhibit her daughters emotional maturity. Focused on the goal of her future and explored her feelings about becoming a foster parent.    Suicidal/Homicidal: Nowithout intent/plan  Therapist Response: Assessed patients current functioning and reviewed progress. Reviewed coping strategies. Assessed patients safety and assisted in identifying protective factors.  Reviewed crisis plan with patient. Assisted patient with the expression of anxiety. Reviewed patients self care plan. Assessed progress related to self care. Patients self care is good. Recommend daily exercise, increased socialization and recreation. Used CBT to assist patient with the identification of negative distortions and irrational thoughts. Encouraged patient to verbalize alternative and factual responses which challenge thought distortions. Used motivational interviewing to assist and encourage patient through the  change process. Explored patients barriers to change.   Plan: Return again in one weeks.  Diagnosis: Axis I: Asperger's Disorder    Axis II: No diagnosis    Jocabed Cheese, LCSW 05/05/2013

## 2013-05-13 ENCOUNTER — Ambulatory Visit (INDEPENDENT_AMBULATORY_CARE_PROVIDER_SITE_OTHER): Payer: Medicare Other | Admitting: Licensed Clinical Social Worker

## 2013-05-13 DIAGNOSIS — F845 Asperger's syndrome: Secondary | ICD-10-CM

## 2013-05-13 DIAGNOSIS — F848 Other pervasive developmental disorders: Secondary | ICD-10-CM | POA: Diagnosis not present

## 2013-05-13 NOTE — Progress Notes (Signed)
   THERAPIST PROGRESS NOTE  Session Time: 2:00pm-2:50pm  Participation Level: Active  Behavioral Response: Well GroomedAlertEuthymic  Type of Therapy: Individual Therapy  Treatment Goals addressed: Coping  Interventions: CBT, Motivational Interviewing, Strength-based, Supportive and Reframing  Summary: Michaela Norris is a 37 y.o. female who presents with euthymic mood and anxious affect. She reports stress because she is taking care of her neighbors children and is overwhelmed. She is angry and feels that her friend is taking advantage of her. She states that she feels able to communicate her frustration with her friend and set some firm boundaries. She continues to focus on her hope of adopting a child. She reports that her self care has been poor due to increased time spent on campus and that she wants to focus on improving this. She is also working on finding a place to Agricultural consultant. Her sleep and appetite are wnl.   Suicidal/Homicidal: Nowithout intent/plan  Therapist Response: Assessed patients current functioning and reviewed progress. Reviewed coping strategies. Assessed patients safety and assisted in identifying protective factors.  Reviewed crisis plan with patient. Assisted patient with the expression of frustration with her family. Reviewed patients self care plan. Assessed progress related to self care. Patients self care is fair, but could improve. Recommend daily exercise, increased socialization and recreation. Used CBT to assist patient with the identification of negative distortions and irrational thoughts. Encouraged patient to verbalize alternative and factual responses which challenge thought distortions. Used motivational interviewing to assist and encourage patient through the change process. Explored patients barriers to change.   Plan: Return again in one weeks. Patient will finish her thesis, Find volunteer work to decrease isolation, patient will start her adoption  application in February, Patient will improve her self care by cooking at home, choosing healthy foods and exercise daily.  Diagnosis: Axis I: Asperger's Disorder    Axis II: No diagnosis    Lynsey Ange, LCSW 05/13/2013

## 2013-05-18 ENCOUNTER — Ambulatory Visit (INDEPENDENT_AMBULATORY_CARE_PROVIDER_SITE_OTHER): Payer: Medicare Other | Admitting: Licensed Clinical Social Worker

## 2013-05-18 DIAGNOSIS — F848 Other pervasive developmental disorders: Secondary | ICD-10-CM

## 2013-05-18 DIAGNOSIS — F845 Asperger's syndrome: Secondary | ICD-10-CM

## 2013-05-18 NOTE — Progress Notes (Signed)
   THERAPIST PROGRESS NOTE  Session Time: 2:00pm-2:50pm  Participation Level: Active  Behavioral Response: Well GroomedAlertAnxious  Type of Therapy: Individual Therapy  Treatment Goals addressed: Coping  Interventions: CBT, Strength-based, Supportive and Reframing  Summary: Michaela Norris is a 37 y.o. female who presents with anxious mood and affect. She reports increased stress related to the children she is watching. She processes her frustration over how the children are currently being cared for and has spoken with CPS but the case is not significant enough to warrant removal. Patient processes her ongoing excitement about providing foster care. She demonstrates some self doubt. She continues to adjust to her daughters new emotional maturity. Her sleep and appetite are wnl.  Suicidal/Homicidal: Nowithout intent/plan  Therapist Response: Assessed patients current functioning and reviewed progress. Reviewed coping strategies. Assessed patients safety and assisted in identifying protective factors.  Reviewed crisis plan with patient. Assisted patient with the expression of frustration. Reviewed patients self care plan. Assessed progress related to self care. Patients self care is good . Recommend daily exercise, increased socialization and recreation. Used CBT to assist patient with the identification of negative distortions and irrational thoughts. Encouraged patient to verbalize alternative and factual responses which challenge thought distortions. Reviewed healthy boundaries and assertive communication.   Plan: Return again in one weeks.Patient will finish her thesis, Find volunteer work to decrease isolation, patient will start her adoption application in February, Patient will improve her self care by cooking at home, choosing healthy foods and exercise daily.   Diagnosis: Axis I: Asperger's Disorder    Axis II: No diagnosis    Meric Joye, LCSW 05/18/2013

## 2013-05-28 ENCOUNTER — Other Ambulatory Visit: Payer: Self-pay | Admitting: Family Medicine

## 2013-05-28 NOTE — Telephone Encounter (Signed)
Med filled.  

## 2013-06-02 ENCOUNTER — Ambulatory Visit (INDEPENDENT_AMBULATORY_CARE_PROVIDER_SITE_OTHER): Payer: Medicare Other | Admitting: Licensed Clinical Social Worker

## 2013-06-02 DIAGNOSIS — F845 Asperger's syndrome: Secondary | ICD-10-CM

## 2013-06-02 DIAGNOSIS — F848 Other pervasive developmental disorders: Secondary | ICD-10-CM | POA: Diagnosis not present

## 2013-06-02 NOTE — Progress Notes (Signed)
   THERAPIST PROGRESS NOTE  Session Time: 3:00pm-3:50pm  Participation Level: Active  Behavioral Response: Well GroomedAlertAnxious  Type of Therapy: Individual Therapy  Treatment Goals addressed: Coping  Interventions: CBT, Strength-based and Supportive  Summary: Michaela Norris is a 38 y.o. female who presents with euthymic mood and bright affect. She reports improvement in her anxiety and frustration since her last visit since she is no longer caring for her neighbors children. She processes her anger with her friend and her family. Her insight is developing and she is no longer able to tolerate being around her family for very long. She is able to identify and discuss the dysfunction.   Suicidal/Homicidal: Nowithout intent/plan  Therapist Response: Assessed patients current functioning and reviewed progress. Reviewed coping strategies. Assessed patients safety and assisted in identifying protective factors.  Reviewed crisis plan with patient. Assisted patient with the expression of frustration. Reviewed patients self care plan. Assessed progress related to self care. Patients self care is good. Recommend daily exercise, increased socialization and recreation. Reviewed healthy boundaries and assertive communication. Used CBT to assist patient with the identification of negative distortions and irrational thoughts. Encouraged patient to verbalize alternative and factual responses which challenge thought distortions.   Plan: Return again in two weeks.Patient will finish her thesis, Find volunteer work to decrease isolation, patient will start her adoption application in February, Patient will improve her self care by cooking at home, choosing healthy foods and exercise daily.      Diagnosis: Axis I: Asperger's Disorder    Axis II: No diagnosis    Ileene Allie, LCSW 06/02/2013

## 2013-06-14 ENCOUNTER — Ambulatory Visit (INDEPENDENT_AMBULATORY_CARE_PROVIDER_SITE_OTHER): Payer: Medicare Other | Admitting: Licensed Clinical Social Worker

## 2013-06-14 DIAGNOSIS — F845 Asperger's syndrome: Secondary | ICD-10-CM

## 2013-06-14 DIAGNOSIS — F848 Other pervasive developmental disorders: Secondary | ICD-10-CM | POA: Diagnosis not present

## 2013-06-14 NOTE — Progress Notes (Signed)
   THERAPIST PROGRESS NOTE  Session Time: 4:00pm-4:50pm  Participation Level: Active  Behavioral Response: Well GroomedAlertDepressed  Type of Therapy: Individual Therapy  Treatment Goals addressed: Coping  Interventions: CBT, Strength-based, Supportive and Reframing  Summary: Michaela Norris is a 38 y.o. female who presents with depressed mood and congruent affect. She reports poor motivation and feels that she is affected by the winter and the lack of sunlight. She lacks energy but is forcing herself to maintain her commitments. She remains optimistic about her life and her future. She processes her fear that she will be turned down and not allowed to adopt a child. She has been exploring her motivation to ensure that if she is rejected she is able to continue to have a fufilling life. Informed patient of this writers departure. Will process transition further at the next session. Patient may be able to continue treatment with this Probation officer at [facility]. Patient will look into this.   Suicidal/Homicidal: Nowithout intent/plan  Therapist Response: Assessed patients current functioning and reviewed progress. Reviewed coping strategies. Assessed patients safety and assisted in identifying protective factors.  Reviewed crisis plan with patient. Assisted patient with the expression of sadness. Reviewed patients self care plan. Assessed progress related to self care. Patients self care is good. Recommend daily exercise, increased socialization and recreation. Used CBT to assist patient with the identification of negative distortions and irrational thoughts. Encouraged patient to verbalize alternative and factual responses which challenge thought distortions. Reviewed healthy boundaries and assertive communication.   Plan: Return again in one weeks.Patient will finish her thesis, Find volunteer work to decrease isolation, patient will start her adoption application in February, Patient will improve her self  care by cooking at home, choosing healthy foods and exercise daily.      Diagnosis: Axis I: Asperger's Disorder    Axis II: No diagnosis    Helga Asbury, LCSW 06/14/2013

## 2013-06-15 ENCOUNTER — Encounter: Payer: Self-pay | Admitting: Neurology

## 2013-06-16 NOTE — Progress Notes (Signed)
Quick Note:  I reviewed the patient's CPAP compliance data from 05/11/2013 through 06/09/2013 which is a total of 30 days, during which time the patient used CPAP every day. The average usage for all days was 8 hours and 46 minutes. The percent used days greater than 4 hours was 100 %, indicating excellent compliance. The residual AHI was 0.6 per hour, indicating an appropriate treatment pressure of 11 cwp with EPR of 3. I will review this data with the patient at the next office visit, provide feedback and additional troubleshooting if need be. I do not see a pending appointment. She will have to be contacted for scheduling a followup appointment.  Star Age, MD, PhD [facility] ([facility]   ______

## 2013-06-18 ENCOUNTER — Ambulatory Visit (INDEPENDENT_AMBULATORY_CARE_PROVIDER_SITE_OTHER): Payer: Medicare Other | Admitting: Family

## 2013-06-18 ENCOUNTER — Telehealth: Payer: Self-pay | Admitting: Family Medicine

## 2013-06-18 ENCOUNTER — Encounter: Payer: Self-pay | Admitting: Family

## 2013-06-18 ENCOUNTER — Ambulatory Visit: Payer: Medicare Other | Admitting: Neurology

## 2013-06-18 VITALS — BP 132/88 | Temp 101.9°F | Wt 352.8 lb

## 2013-06-18 DIAGNOSIS — R509 Fever, unspecified: Secondary | ICD-10-CM | POA: Diagnosis not present

## 2013-06-18 DIAGNOSIS — J209 Acute bronchitis, unspecified: Secondary | ICD-10-CM | POA: Diagnosis not present

## 2013-06-18 DIAGNOSIS — R059 Cough, unspecified: Secondary | ICD-10-CM | POA: Diagnosis not present

## 2013-06-18 DIAGNOSIS — R05 Cough: Secondary | ICD-10-CM | POA: Diagnosis not present

## 2013-06-18 MED ORDER — DOXYCYCLINE HYCLATE 100 MG PO TABS: 100.0000 mg | ORAL_TABLET | Freq: Two times a day (BID) | ORAL | Status: DC

## 2013-06-18 MED ORDER — METHYLPREDNISOLONE 4 MG PO KIT: PACK | ORAL | Status: DC

## 2013-06-18 NOTE — Addendum Note (Signed)
Addended by: Santiago Bumpers on: 06/18/2013 04:09 PM   Modules accepted: Orders

## 2013-06-18 NOTE — Telephone Encounter (Signed)
Pt rx for Doxycycline 100 mg twice a day x10 days And Medrol Dosepak is not at pharm yet  Could you send to [facility]/ [address] thank

## 2013-06-18 NOTE — Progress Notes (Signed)
Subjective:    Patient ID: Michaela Norris, female    DOB: 15-Feb-1976, 38 y.o.   MRN: [id number]  HPI 38 year old white female, nonsmoker is in today with complaints of cough, congestion, productive cough with green to brown phlegm, fever, fatigue x10 days and worsening. She's been taking over-the-counter Mucinex without relief. Reports using a CPAP at night and has been unable to use it due to shortness of breath and wheezing.   Review of Systems  Constitutional: Negative.   HENT: Positive for congestion, postnasal drip and sinus pressure.   Respiratory: Positive for cough. Negative for shortness of breath and wheezing.   Cardiovascular: Negative.   Gastrointestinal: Negative.   Endocrine: Negative.   Genitourinary: Negative.   Musculoskeletal: Negative.   Skin: Negative.   Psychiatric/Behavioral: Negative.    Past Medical History  Diagnosis Date  . Anxiety   . Depression   . Headache(784.0)   . Psychosis   . Schizoaffective disorder   . Mania   . Obesity   . PTSD (post-traumatic stress disorder)   . High blood pressure     off meds at this time  . Diabetes mellitus     notes borderline  . Hyperlipidemia   . OSA on CPAP 03/18/2013    History   Social History  . Marital Status: Single    Spouse Name: N/A    Number of Children: N/A  . Years of Education: N/A   Occupational History  . Not on file.   Social History Main Topics  . Smoking status: Never Smoker   . Smokeless tobacco: Never Used  . Alcohol Use: No  . Drug Use: No  . Sexual Activity: Not Currently   Other Topics Concern  . Not on file   Social History Narrative  . No narrative on file    Past Surgical History  Procedure Laterality Date  . Fracture surgery    . Cholecystectomy    . Broken ankle  2000    Family History  Problem Relation Age of Onset  . Alcohol abuse Mother   . Depression Mother   . Alcohol abuse Brother   . Suicidality Maternal Grandmother   . Anorexia nervosa  Maternal Grandmother   . Asperger's syndrome Daughter   . Schizophrenia Father   . Brain cancer Father   . Early death Father     Allergies  Allergen Reactions  . Latex Itching and Rash  . Penicillins Swelling    Swelling in hands; states she still takes it as needed  . Tegretol [Carbamazepine] Rash  . Vicodin [Hydrocodone-Acetaminophen] Rash    Current Outpatient Prescriptions on File Prior to Visit  Medication Sig Dispense Refill  . acetaZOLAMIDE (DIAMOX) 500 MG capsule Take 1 capsule (500 mg total) by mouth 2 (two) times daily. 1 pill nightly at bedtime for 1 week, then 1 pill twice daily thereafter.  60 capsule  5  . cyclobenzaprine (FLEXERIL) 10 MG tablet Take 10 mg by mouth at bedtime.      Marland Kitchen FIBER PO Take by mouth.      . fluticasone (FLONASE) 50 MCG/ACT nasal spray Place 1 spray into the nose at bedtime.  16 g  2  . meclizine (ANTIVERT) 50 MG tablet Take 1 tablet (50 mg total) by mouth 3 (three) times daily as needed.  60 tablet  0  . MELATONIN PO Take by mouth.      . meloxicam (MOBIC) 15 MG tablet TAKE 1 TABLET BY MOUTH EVERY DAY  30  tablet  0  . Multiple Vitamin (MULTIVITAMIN) tablet Take 1 tablet by mouth daily.       No current facility-administered medications on file prior to visit.    BP 132/88  Temp(Src) 101.9 F (38.8 C) (Oral)  Wt 352 lb 12.8 oz (160.029 kg)chart     Objective:   Physical Exam  Constitutional: She is oriented to person, place, and time. She appears well-developed and well-nourished.  HENT:  Right Ear: External ear normal.  Left Ear: External ear normal.  Nose: Nose normal.  Mouth/Throat: Oropharynx is clear and moist.  Neck: Normal range of motion. Neck supple.  Cardiovascular: Normal rate, regular rhythm and normal heart sounds.   Pulmonary/Chest: Effort normal and breath sounds normal.  Musculoskeletal: Normal range of motion.  Neurological: She is alert and oriented to person, place, and time.  Skin: Skin is warm and dry.    Psychiatric: She has a normal mood and affect.          Assessment & Plan:  Assessment: 1. Acute bronchitis versus pneumonia. Doxycycline 100 mg twice a day x10 days. Medrol Dosepak as directed. Patient to the office if symptoms worsen or persist. Recheck as scheduled, and as needed. 2. Fever-Tylenol as needed as he reduce her

## 2013-06-18 NOTE — Telephone Encounter (Signed)
Done

## 2013-06-18 NOTE — Progress Notes (Signed)
Pre visit review using our clinic review tool, if applicable. No additional management support is needed unless otherwise documented below in the visit note. 

## 2013-06-21 ENCOUNTER — Ambulatory Visit (INDEPENDENT_AMBULATORY_CARE_PROVIDER_SITE_OTHER): Payer: Medicare Other | Admitting: Licensed Clinical Social Worker

## 2013-06-21 DIAGNOSIS — F845 Asperger's syndrome: Secondary | ICD-10-CM

## 2013-06-21 DIAGNOSIS — F848 Other pervasive developmental disorders: Secondary | ICD-10-CM

## 2013-06-21 NOTE — Progress Notes (Signed)
   THERAPIST PROGRESS NOTE  Session Time: 3:00pm-3:50pm  Participation Level: Active  Behavioral Response: Well GroomedAlertAnxious  Type of Therapy: Individual Therapy  Treatment Goals addressed: Coping  Interventions: CBT, Motivational Interviewing, Strength-based, Supportive and Reframing  Summary: Michaela Norris is a 38 y.o. female who presents with anxious mood and affect. She reports increased anxiety about moving to [place] and she states that it is her fear which keeps her here. She processes her fear and explores what is at risk. Processed this writers departure and patient will plan to follow up at [facility] in March.    Suicidal/Homicidal: Nowithout intent/plan  Therapist Response: Assessed patients current functioning and reviewed progress. Reviewed coping strategies. Assessed patients safety and assisted in identifying protective factors.  Reviewed crisis plan with patient. Assisted patient with the expression of frustration with her own fear. Reviewed patients self care plan. Assessed progress related to self care. Patients self care is good. Recommend daily exercise, increased socialization and recreation. Used CBT to assist patient with the identification of negative distortions and irrational thoughts. Encouraged patient to verbalize alternative and factual responses which challenge thought distortions. Reviewed healthy boundaries and assertive communication.   Plan: Follow up at [facility] counseling center.   Diagnosis: Axis I: Asperger's Disorder    Axis II: No diagnosis    Michaela Banning, LCSW 06/21/2013

## 2013-06-22 ENCOUNTER — Encounter: Payer: Self-pay | Admitting: Neurology

## 2013-06-30 ENCOUNTER — Encounter: Payer: Self-pay | Admitting: General Practice

## 2013-07-01 ENCOUNTER — Ambulatory Visit: Payer: Self-pay | Admitting: Family Medicine

## 2013-07-28 ENCOUNTER — Ambulatory Visit (INDEPENDENT_AMBULATORY_CARE_PROVIDER_SITE_OTHER): Payer: Medicare Other | Admitting: Family Medicine

## 2013-07-28 ENCOUNTER — Encounter: Payer: Self-pay | Admitting: Family Medicine

## 2013-07-28 VITALS — BP 126/82 | HR 82 | Temp 98.2°F | Resp 16 | Wt 344.4 lb

## 2013-07-28 DIAGNOSIS — M549 Dorsalgia, unspecified: Secondary | ICD-10-CM

## 2013-07-28 DIAGNOSIS — R10A1 Flank pain, right side: Secondary | ICD-10-CM | POA: Insufficient documentation

## 2013-07-28 DIAGNOSIS — R319 Hematuria, unspecified: Secondary | ICD-10-CM | POA: Diagnosis not present

## 2013-07-28 DIAGNOSIS — R109 Unspecified abdominal pain: Secondary | ICD-10-CM

## 2013-07-28 LAB — HEPATIC FUNCTION PANEL
ALT: 21 U/L (ref 0–35)
AST: 17 U/L (ref 0–37)
Albumin: 3.7 g/dL (ref 3.5–5.2)
Alkaline Phosphatase: 78 U/L (ref 39–117)
BILIRUBIN DIRECT: 0.1 mg/dL (ref 0.0–0.3)
Total Bilirubin: 0.9 mg/dL (ref 0.3–1.2)
Total Protein: 7.4 g/dL (ref 6.0–8.3)

## 2013-07-28 LAB — CBC WITH DIFFERENTIAL/PLATELET
BASOS PCT: 0.3 % (ref 0.0–3.0)
Basophils Absolute: 0 10*3/uL (ref 0.0–0.1)
Eosinophils Absolute: 0.2 10*3/uL (ref 0.0–0.7)
Eosinophils Relative: 1.9 % (ref 0.0–5.0)
HCT: 40.1 % (ref 36.0–46.0)
Hemoglobin: 12.8 g/dL (ref 12.0–15.0)
LYMPHS ABS: 2.2 10*3/uL (ref 0.7–4.0)
Lymphocytes Relative: 24.9 % (ref 12.0–46.0)
MCHC: 31.9 g/dL (ref 30.0–36.0)
MCV: 74.9 fl — AB (ref 78.0–100.0)
MONO ABS: 0.4 10*3/uL (ref 0.1–1.0)
Monocytes Relative: 4.1 % (ref 3.0–12.0)
NEUTROS PCT: 68.8 % (ref 43.0–77.0)
Neutro Abs: 6 10*3/uL (ref 1.4–7.7)
PLATELETS: 239 10*3/uL (ref 150.0–400.0)
RBC: 5.35 Mil/uL — ABNORMAL HIGH (ref 3.87–5.11)
RDW: 18.1 % — ABNORMAL HIGH (ref 11.5–14.6)
WBC: 8.7 10*3/uL (ref 4.5–10.5)

## 2013-07-28 LAB — POCT URINALYSIS DIPSTICK
Bilirubin, UA: NEGATIVE
Glucose, UA: NEGATIVE
Ketones, UA: NEGATIVE
Leukocytes, UA: NEGATIVE
Nitrite, UA: NEGATIVE
Protein, UA: NEGATIVE
Spec Grav, UA: 1.01
Urobilinogen, UA: 0.2
pH, UA: 6

## 2013-07-28 LAB — BASIC METABOLIC PANEL
BUN: 8 mg/dL (ref 6–23)
CO2: 24 mEq/L (ref 19–32)
Calcium: 9.2 mg/dL (ref 8.4–10.5)
Chloride: 106 mEq/L (ref 96–112)
Creatinine, Ser: 0.6 mg/dL (ref 0.4–1.2)
GFR: 122.07 mL/min (ref >60.00)
Glucose, Bld: 91 mg/dL (ref 70–99)
Potassium: 4.1 mEq/L (ref 3.5–5.1)
Sodium: 138 mEq/L (ref 135–145)

## 2013-07-28 NOTE — Progress Notes (Signed)
   Subjective:    Patient ID: Michaela Norris, female    DOB: 1975-07-03, 38 y.o.   MRN: [id number]  HPI R flank pain- + R CVA tenderness, sxs started 8 days ago.  Radiating to front.  No pain w/ urination.  Denies hematuria but is currently on menses (x3 days).  + nausea, anorexia.  Denies urinary frequency, urgency, hesitancy.  Pain is worst w/ standing still, better w/ movement and lying down.  Pain started to improve yesterday.   Review of Systems For ROS see HPI     Objective:   Physical Exam  Vitals reviewed. Constitutional: She is oriented to person, place, and time. She appears well-developed and well-nourished. No distress.  Morbidly obese  Abdominal: Soft. She exhibits no distension. There is no tenderness. There is no rebound and no guarding.  + R flank pain, ? CVA tenderness- seems more lateral  Musculoskeletal:  Full flexion/extension of back w/o difficulty  Neurological: She is alert and oriented to person, place, and time. No cranial nerve deficit. Coordination normal.  Skin: Skin is warm and dry.          Assessment & Plan:

## 2013-07-28 NOTE — Patient Instructions (Signed)
Follow up as needed We'll notify you of your lab results Restart the Meloxicam Use the Flexeril as needed Call with any questions or concerns- particularly if your symptoms change or worsen Hang in there!!

## 2013-07-28 NOTE — Progress Notes (Signed)
Pre visit review using our clinic review tool, if applicable. No additional management support is needed unless otherwise documented below in the visit note. 

## 2013-07-28 NOTE — Assessment & Plan Note (Signed)
New.  No evidence on UA or on pt report of UTI but must consider kidney stones.  Pt's pain has actually improved in the last 2 days.  + blood in urine but pt is on menses.  Check labs to r/o renal insufficiency, infxn.  If pain recurs, will need CT to r/o stone.  meds for pain and inflammation given.  Will follow.

## 2013-07-29 ENCOUNTER — Telehealth: Payer: Self-pay | Admitting: Family Medicine

## 2013-07-29 MED ORDER — CYCLOBENZAPRINE HCL 10 MG PO TABS: 10.0000 mg | ORAL_TABLET | Freq: Every day | ORAL | Status: DC

## 2013-07-29 MED ORDER — MELOXICAM 15 MG PO TABS: ORAL_TABLET | ORAL | Status: DC

## 2013-07-29 NOTE — Telephone Encounter (Signed)
These were resent today.

## 2013-07-29 NOTE — Telephone Encounter (Signed)
Patient states she was seen yesterday we were supposed to send rx for flexeril and meloxicam. She states her pharmacy does not have these. Pt uses [facility] on [address]

## 2013-07-30 LAB — URINE CULTURE

## 2013-08-25 ENCOUNTER — Ambulatory Visit (INDEPENDENT_AMBULATORY_CARE_PROVIDER_SITE_OTHER): Payer: Medicare Other | Admitting: Neurology

## 2013-08-25 ENCOUNTER — Encounter: Payer: Self-pay | Admitting: Neurology

## 2013-08-25 VITALS — BP 127/77 | HR 81 | Temp 97.7°F | Ht 68.5 in | Wt 347.0 lb

## 2013-08-25 DIAGNOSIS — R209 Unspecified disturbances of skin sensation: Secondary | ICD-10-CM

## 2013-08-25 DIAGNOSIS — G4733 Obstructive sleep apnea (adult) (pediatric): Secondary | ICD-10-CM | POA: Diagnosis not present

## 2013-08-25 DIAGNOSIS — G932 Benign intracranial hypertension: Secondary | ICD-10-CM | POA: Diagnosis not present

## 2013-08-25 DIAGNOSIS — Z9989 Dependence on other enabling machines and devices: Secondary | ICD-10-CM

## 2013-08-25 DIAGNOSIS — R208 Other disturbances of skin sensation: Secondary | ICD-10-CM

## 2013-08-25 DIAGNOSIS — R202 Paresthesia of skin: Secondary | ICD-10-CM

## 2013-08-25 MED ORDER — TOPIRAMATE 50 MG PO TABS: 100.0000 mg | ORAL_TABLET | Freq: Two times a day (BID) | ORAL | Status: DC

## 2013-08-25 NOTE — Progress Notes (Signed)
Subjective:    Patient ID: Michaela Norris is a 38 y.o. female.  HPI    Interim history:   Ms. Cumberland is a 38 year old right-handed woman with an underlying medical history of morbid obesity, Asperger's syndrome, schizo-affective d/o, vertigo, and low back pain, who presents for followup consultation of her recurrent headaches in the contex to pseudotumor cerebri as well as her OSA. She is unaccompanied today. I last saw her on 04/16/2013, at which time I talked to her about her her CPAP compliance and encouraged her to use CPAP every night. I encouraged her to continue Diamox and seek followup with ophthalmology. Diamox was causing her paresthesias and we talked about switching her to Topamax in the future but she was reluctant to try it as she was afraid of cognitive side effects. I encouraged her to lose weight.  I reviewed the patient's CPAP compliance data from 05/11/2013 through 06/09/2013 which is a total of 30 days, during which time the patient used CPAP every day. The average usage for all days was 8 hours and 46 minutes. The percent used days greater than 4 hours was 100 %, indicating excellent compliance. The residual AHI was 0.6 per hour, indicating an appropriate treatment pressure of 11 cwp with EPR of 3.  Today, she reports that she has ongoing paresthesias, which are very disturbing to her and even affect her face and perioral area. She is taking a break from school. She is willing to try topamax, but worries about side effects. She is faithful with her CPAP use. She switches between nasal pillows (causes nasal and sinus congestion) and a FFM (causes severe mouth dryness). She has trouble falling asleep. She has had no medication changes. She had cold 2 months ago, treated with Abx and steroids PO.   I saw her on 03/18/2013 at which time restarted her on Diamox for pseudotumor cerebri. I also went over diagnostic test results including the lumbar puncture results, the sleep study  results, and also discussed how she had been doing on CPAP. She indicated full compliance with CPAP and good results with using CPAP and felt, she slept better with less EDS. Unfortunately, at the time of her visit in 9/14 I did not have the requested compliance download data available which was supposed to be faxed to [place] by her DME company, [place].  She has had night time panic attacks before treatment with CPAP and those improved.  She has had more allergy Sx and her diamox caused her numbness in tingling in her face, hands and feet. She had no blurry vision.  I reviewed her compliance data from 02/21/2013 through 03/22/2013 (30 days), during which time she was fully compliant, using CPAP every day. Her percent used days greater than 4 hours was 97%, indicating excellent compliance. Her average usage for all days was 9 hours and 5 minutes. Her residual AHI was low at 1.3 per hour, indicating an appropriate treatment pressure of 11 cm with EPR of 3. Her leak was also not very high.  I first met her on 12/16/2012, at which time I suggested an ophthalmology referral, and a lumbar puncture under fluoroscopic guidance as well as a sleep study based on concern for obstructive sleep apnea.  She had her LP on 03/09/2013: OP was elevated at 38.5 cm water. Following removal of 34 mL of clear CSF, the CP was 11.5 cm water. All lab results routinely done on the CSF were negative.  She had a split-night sleep study on 01/04/2013:  baseline sleep efficiency was reduced at 55.4% with a latency to sleep of 17 minutes and wake after sleep onset of 45.5 minutes with moderate sleep fragmentation noted. She had an increased percentage of stage I and stage II sleep, a normal percentage of deep sleep, and absence of REM sleep prior to CPAP initiation. Her total AHI was 34.1 per hour. Her baseline oxygen saturation was 92%, her nadir was 86%. She was then titrated on CPAP from 7 cm to 11 cm of water pressure and on the final  pressure she had a residual AHI of 0 per hour, with EPR of 2 for comfort. She was noted to have periodic leg movements of sleep prior to CPAP at 12.4 per hour but no significant arousals from these. Based on the test results I ordered CPAP for her. She did complain of nasal stuffiness and pressure on her face and nose from the mask. She adjusted to it fairly well, could not tolerate the FFM and overall felt better.  She saw an ophthalmologist at [facility] ophthalmology, and she was told, there was no papilledema. She was told to come back in 2 years.  She had a bout of vertigo recently and she wondered if this is related to the CPAP. It improved after a few days and resolved after a week.  She was diagnosed with probable PTC perhaps 5 years ago, by an ophthalmologist at the time, who found papilledema. She saw 2 other neurologists in our office in the past and was prescribed Diamox by Dr. Brett Fairy in our office and was last seen by Dr. Brett Fairy in 5/11, at which time she still had not had her LP due to not showing up for the tap and claiming she felt better. She had MRI/MRA/MRV in the past which were negative and she felt better after coming off of Lithium in 2012.  She had SE on Diamox d/t numbness in her hands and feet. She saw an ophthalmologist in 2013, who did not find evidence of Papilledema. She endorsed snoring and has had choking sensations or dreams of breathing pauses and often woke up in a panic and sweaty. She reported EDS before being treated with CPAP.   Her Past Medical History Is Significant For: Past Medical History  Diagnosis Date  . Anxiety   . Depression   . Headache(784.0)   . Psychosis   . Schizoaffective disorder   . Mania   . Obesity   . PTSD (post-traumatic stress disorder)   . High blood pressure     off meds at this time  . Diabetes mellitus     notes borderline  . Hyperlipidemia   . OSA on CPAP 03/18/2013    Her Past Surgical History Is Significant For: Past  Surgical History  Procedure Laterality Date  . Fracture surgery    . Cholecystectomy    . Broken ankle  2000    Her Family History Is Significant For: Family History  Problem Relation Age of Onset  . Alcohol abuse Mother   . Depression Mother   . Alcohol abuse Brother   . Suicidality Maternal Grandmother   . Anorexia nervosa Maternal Grandmother   . Asperger's syndrome Daughter   . Schizophrenia Father   . Brain cancer Father   . Early death Father     Her Social History Is Significant For: History   Social History  . Marital Status: Single    Spouse Name: N/A    Number of Children: N/A  . Years  of Education: N/A   Social History Main Topics  . Smoking status: Never Smoker   . Smokeless tobacco: Never Used  . Alcohol Use: No  . Drug Use: No  . Sexual Activity: Not Currently   Other Topics Concern  . None   Social History Narrative  . None    Her Allergies Are:  Allergies  Allergen Reactions  . Latex Itching and Rash  . Penicillins Swelling    Swelling in hands; states she still takes it as needed  . Tegretol [Carbamazepine] Rash  . Vicodin [Hydrocodone-Acetaminophen] Rash  :   Her Current Medications Are:  Outpatient Encounter Prescriptions as of 08/25/2013  Medication Sig  . acetaZOLAMIDE (DIAMOX) 500 MG capsule Take 1 capsule (500 mg total) by mouth 2 (two) times daily. 1 pill nightly at bedtime for 1 week, then 1 pill twice daily thereafter.  . cyclobenzaprine (FLEXERIL) 10 MG tablet Take 1 tablet (10 mg total) by mouth at bedtime.  Marland Kitchen FIBER PO Take by mouth.  . fluticasone (FLONASE) 50 MCG/ACT nasal spray Place 1 spray into the nose at bedtime.  . meclizine (ANTIVERT) 50 MG tablet Take 1 tablet (50 mg total) by mouth 3 (three) times daily as needed.  Marland Kitchen MELATONIN PO Take by mouth.  . meloxicam (MOBIC) 15 MG tablet TAKE 1 TABLET BY MOUTH EVERY DAY  . Multiple Vitamin (MULTIVITAMIN) tablet Take 1 tablet by mouth daily.  . [DISCONTINUED] doxycycline  (VIBRA-TABS) 100 MG tablet Take 1 tablet (100 mg total) by mouth 2 (two) times daily.  :  Review of Systems:  Out of a complete 14 point review of systems, all are reviewed and negative with the exception of these symptoms as listed below:  Review of Systems  Constitutional: Negative.   HENT: Positive for ear pain and tinnitus.   Eyes: Positive for photophobia and pain.  Respiratory: Positive for apnea.   Cardiovascular: Negative.   Gastrointestinal: Negative.   Endocrine: Negative.   Genitourinary: Negative.   Musculoskeletal: Positive for arthralgias, back pain and neck stiffness.  Skin: Negative.   Allergic/Immunologic: Negative.   Neurological: Positive for dizziness and headaches.  Hematological:       Anemia  Psychiatric/Behavioral: Positive for sleep disturbance. The patient is nervous/anxious.     Objective:  Neurologic Exam  Physical Exam Physical Examination:   Filed Vitals:   08/25/13 1203  BP: 127/77  Pulse: 81  Temp: 97.7 F (36.5 C)    General Examination: The patient is pleasant 38 y.o. female in no acute distress. She is morbidly obese.   HEENT: Normocephalic, atraumatic, pupils are equal, round and reactive to light and accommodation. Funduscopic exam is normal with sharp disc margins noted. No papilledema. Extraocular tracking is good without limitation to gaze excursion or nystagmus noted. Normal smooth pursuit is noted. Hearing is grossly intact. Face is symmetric with normal facial animation and normal facial sensation. Speech is clear with no dysarthria noted. There is no hypophonia. There is no lip, neck/head, jaw or voice tremor. Neck is supple with full range of passive and active motion. There are no carotid bruits on auscultation. Oropharynx exam reveals: mild mouth dryness, adequate dental hygiene and moderate airway crowding, due to narrow airway and tonsillar size of 2+. Mallampati is class II. Tongue protrudes centrally and palate elevates  symmetrically. Neck size is enlarged. Nasal inspection reveals mild mucosal bogginess and mild erythema.   Chest: Clear to auscultation without wheezing, rhonchi or crackles noted.  Heart: S1+S2+0, regular and normal without murmurs,  rubs or gallops noted.   Abdomen: Soft, non-tender and non-distended with normal bowel sounds appreciated on auscultation.  Extremities: There is no pitting edema in the distal lower extremities bilaterally. Pedal pulses are intact.  Skin: Warm and dry without trophic changes noted. There are no varicose veins.  Musculoskeletal: exam reveals no obvious joint deformities, tenderness or joint swelling or erythema.   Neurologically:  Mental status: The patient is awake, alert and oriented in all 4 spheres. Her memory, attention, language and knowledge are appropriate. There is no aphasia, agnosia, apraxia or anomia. Speech is clear with normal prosody and enunciation. Thought process is linear. Mood is congruent and affect is normal today.   Cranial nerves are as described above under HEENT exam. In addition, shoulder shrug is normal with equal shoulder height noted. Motor exam: Normal bulk, strength and tone is noted. There is no drift, tremor or rebound. Romberg is negative. Reflexes are 1+ throughout. Fine motor skills are intact with normal finger taps, normal hand movements, normal rapid alternating patting, normal foot taps and normal foot agility.  Cerebellar testing shows no dysmetria or intention tremor on finger to nose testing. Heel-to-shin is unremarkable bilaterally. There is no truncal or gait ataxia.  Sensory exam is intact to light touch, pinprick, vibration, and temperature sense.  Gait, station and balance are unremarkable, except for slow in movement d/t body habitus. She turns en bloc.  Assessment and Plan:    In summary, Leiliana Foody is a 38 year old female with a history of morbid obesity and PTC. She also has obstructive sleep apnea (OSA),  confirmed by a recent sleep study. She reports good results with CPAP and endorses full compliance, with recent compliance data confirming great compliance. She has a stable exam, but unfortunately she has had side effects which are very bothersome with Diamox, particularly her dysesthesias. At this juncture, she would like to try to switch to Topamax. 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 Therefore, today I have suggested that she taper off of Diamox by taking one pill once daily for next week, then stop. She will start topiramate 50 mg strength one pill once daily and gradually increase to 2 pills twice daily. She was advised about the common side effects including sedation, paresthesias, glaucoma, kidney stones, word finding difficulty and cognitive side effects. She does not have a history of glaucoma or kidney stones. I commended her for being compliant with CPAP and advised to continue using it regularly. I would like to see her back in 3 months from now, sooner if the need arises.  I encouraged the patient to eat healthy, exercise daily and keep well hydrated, to keep a scheduled bedtime and wake time routine, to not skip any meals and eat healthy snacks in between meals and to have protein with every meal. I stressed the importance of regular exercise and weight loss. Perhaps the Topamax will help her achieve weight loss.    I answered all her questions today and the patient was in agreement with the above outlined plan.

## 2013-08-25 NOTE — Patient Instructions (Addendum)
Please continue using your CPAP regularly. While your insurance requires that you use CPAP at least 4 hours each night on 70% of the nights, I recommend, that you not skip any nights and use it throughout the night if you can. Getting used to CPAP does take time and patience and discipline. Untreated obstructive sleep apnea when it is moderate to severe can have an adverse impact on cardiovascular health and raise her risk for heart disease, arrhythmias, hypertension, congestive heart failure, stroke and diabetes. Untreated obstructive sleep apnea causes sleep disruption, nonrestorative sleep, and sleep deprivation. This can have an impact on your day to day functioning and cause daytime sleepiness and impairment of cognitive function, memory loss, mood disturbance, and problems focussing. Using CPAP regularly can improve these symptoms.  We will take you off diamox: take 1 pill each morning only for 1 week, then stop.  We will start topamax: 50 mg: Take half one pill each bedtime for one week, then one pill twice daily for one week, then 1 pill in AM and 2 pills at bedtime daily for one week, then 2 pills twice daily thereafter. Depending on visual symptoms and headache symptoms, we may go up further. Common side effects reported are: Sedation, sleepiness, tingling, change in taste especially with carbonated drinks, and rare side effects are glaucoma, kidney stones, and problems with thinking including word finding difficulties.

## 2013-08-26 ENCOUNTER — Encounter: Payer: Self-pay | Admitting: Neurology

## 2013-09-03 NOTE — Progress Notes (Signed)
Quick Note:  I reviewed the patient's CPAP compliance data from 07/27/2013 to 08/25/2013, which is a total of 30 days, during which time the patient used CPAP every day except for 1 day. The average usage for all days was 8 hours and 3 minutes. The percent used days greater than 4 hours was 97 %, indicating excellent compliance. The residual AHI was 0.4 per hour, indicating an appropriate treatment pressure of 11 cwp with EPR of 3. I will review this data with the patient at the next office visit, which is currently scheduled for 01/19/2014 at noon, provide feedback and additional troubleshooting if need be.  Star Age, MD, PhD [facility] ([facility]   ______

## 2013-09-07 ENCOUNTER — Encounter: Payer: Self-pay | Admitting: Neurology

## 2014-01-14 DIAGNOSIS — F840 Autistic disorder: Secondary | ICD-10-CM | POA: Diagnosis not present

## 2014-01-18 ENCOUNTER — Encounter: Payer: Self-pay | Admitting: Neurology

## 2014-01-19 ENCOUNTER — Telehealth: Payer: Self-pay | Admitting: Neurology

## 2014-01-19 ENCOUNTER — Ambulatory Visit: Payer: Medicare Other | Admitting: Neurology

## 2014-01-19 ENCOUNTER — Encounter: Payer: Self-pay | Admitting: Neurology

## 2014-01-19 NOTE — Telephone Encounter (Signed)
Patient no showed for an appointment today, 01/19/2014, at 1200.

## 2014-01-21 DIAGNOSIS — F259 Schizoaffective disorder, unspecified: Secondary | ICD-10-CM | POA: Diagnosis not present

## 2014-02-11 DIAGNOSIS — F259 Schizoaffective disorder, unspecified: Secondary | ICD-10-CM | POA: Diagnosis not present

## 2014-02-23 ENCOUNTER — Other Ambulatory Visit: Payer: Self-pay | Admitting: Family Medicine

## 2014-02-23 NOTE — Telephone Encounter (Signed)
Med filled.  

## 2014-02-25 DIAGNOSIS — F251 Schizoaffective disorder, depressive type: Secondary | ICD-10-CM | POA: Diagnosis not present

## 2014-03-11 ENCOUNTER — Other Ambulatory Visit: Payer: Self-pay

## 2014-03-11 DIAGNOSIS — F251 Schizoaffective disorder, depressive type: Secondary | ICD-10-CM | POA: Diagnosis not present

## 2014-03-25 DIAGNOSIS — F251 Schizoaffective disorder, depressive type: Secondary | ICD-10-CM | POA: Diagnosis not present

## 2014-04-01 DIAGNOSIS — F251 Schizoaffective disorder, depressive type: Secondary | ICD-10-CM | POA: Diagnosis not present

## 2014-04-15 DIAGNOSIS — F251 Schizoaffective disorder, depressive type: Secondary | ICD-10-CM | POA: Diagnosis not present

## 2014-04-22 DIAGNOSIS — F251 Schizoaffective disorder, depressive type: Secondary | ICD-10-CM | POA: Diagnosis not present

## 2014-05-13 DIAGNOSIS — F251 Schizoaffective disorder, depressive type: Secondary | ICD-10-CM | POA: Diagnosis not present

## 2014-05-17 DIAGNOSIS — R7301 Impaired fasting glucose: Secondary | ICD-10-CM | POA: Diagnosis not present

## 2014-05-17 DIAGNOSIS — Z0000 Encounter for general adult medical examination without abnormal findings: Secondary | ICD-10-CM | POA: Diagnosis not present

## 2014-05-17 DIAGNOSIS — G4730 Sleep apnea, unspecified: Secondary | ICD-10-CM | POA: Diagnosis not present

## 2014-05-17 DIAGNOSIS — G932 Benign intracranial hypertension: Secondary | ICD-10-CM | POA: Diagnosis not present

## 2014-05-17 DIAGNOSIS — Z136 Encounter for screening for cardiovascular disorders: Secondary | ICD-10-CM | POA: Diagnosis not present

## 2014-05-17 DIAGNOSIS — M549 Dorsalgia, unspecified: Secondary | ICD-10-CM | POA: Diagnosis not present

## 2014-05-18 DIAGNOSIS — F251 Schizoaffective disorder, depressive type: Secondary | ICD-10-CM | POA: Diagnosis not present

## 2014-05-25 DIAGNOSIS — F251 Schizoaffective disorder, depressive type: Secondary | ICD-10-CM | POA: Diagnosis not present

## 2014-05-25 NOTE — Telephone Encounter (Signed)
error 

## 2014-06-03 DIAGNOSIS — F251 Schizoaffective disorder, depressive type: Secondary | ICD-10-CM | POA: Diagnosis not present

## 2014-06-10 DIAGNOSIS — F251 Schizoaffective disorder, depressive type: Secondary | ICD-10-CM | POA: Diagnosis not present

## 2014-06-17 DIAGNOSIS — F251 Schizoaffective disorder, depressive type: Secondary | ICD-10-CM | POA: Diagnosis not present

## 2014-06-24 DIAGNOSIS — F251 Schizoaffective disorder, depressive type: Secondary | ICD-10-CM | POA: Diagnosis not present

## 2014-07-08 DIAGNOSIS — F251 Schizoaffective disorder, depressive type: Secondary | ICD-10-CM | POA: Diagnosis not present

## 2014-07-22 DIAGNOSIS — F251 Schizoaffective disorder, depressive type: Secondary | ICD-10-CM | POA: Diagnosis not present

## 2014-07-29 DIAGNOSIS — F251 Schizoaffective disorder, depressive type: Secondary | ICD-10-CM | POA: Diagnosis not present

## 2014-08-05 DIAGNOSIS — F251 Schizoaffective disorder, depressive type: Secondary | ICD-10-CM | POA: Diagnosis not present

## 2014-08-12 DIAGNOSIS — F251 Schizoaffective disorder, depressive type: Secondary | ICD-10-CM | POA: Diagnosis not present

## 2014-08-17 DIAGNOSIS — R0789 Other chest pain: Secondary | ICD-10-CM | POA: Diagnosis not present

## 2014-08-17 DIAGNOSIS — D508 Other iron deficiency anemias: Secondary | ICD-10-CM | POA: Diagnosis not present

## 2014-08-17 DIAGNOSIS — N926 Irregular menstruation, unspecified: Secondary | ICD-10-CM | POA: Diagnosis not present

## 2014-08-17 DIAGNOSIS — R7301 Impaired fasting glucose: Secondary | ICD-10-CM | POA: Diagnosis not present

## 2014-08-17 DIAGNOSIS — E538 Deficiency of other specified B group vitamins: Secondary | ICD-10-CM | POA: Diagnosis not present

## 2014-08-17 DIAGNOSIS — M549 Dorsalgia, unspecified: Secondary | ICD-10-CM | POA: Diagnosis not present

## 2014-08-19 DIAGNOSIS — F251 Schizoaffective disorder, depressive type: Secondary | ICD-10-CM | POA: Diagnosis not present

## 2014-09-02 DIAGNOSIS — F251 Schizoaffective disorder, depressive type: Secondary | ICD-10-CM | POA: Diagnosis not present

## 2014-09-09 DIAGNOSIS — F251 Schizoaffective disorder, depressive type: Secondary | ICD-10-CM | POA: Diagnosis not present

## 2014-10-02 ENCOUNTER — Emergency Department (HOSPITAL_COMMUNITY)
Admission: EM | Admit: 2014-10-02 | Discharge: 2014-10-02 | Disposition: A | Discharge status: Home / Self Care | Payer: Medicare Other | Attending: Emergency Medicine | Admitting: Emergency Medicine

## 2014-10-02 ENCOUNTER — Encounter (HOSPITAL_COMMUNITY): Payer: Self-pay | Admitting: Emergency Medicine

## 2014-10-02 DIAGNOSIS — Z79899 Other long term (current) drug therapy: Secondary | ICD-10-CM | POA: Insufficient documentation

## 2014-10-02 DIAGNOSIS — R55 Syncope and collapse: Secondary | ICD-10-CM | POA: Diagnosis not present

## 2014-10-02 DIAGNOSIS — Z880 Allergy status to penicillin: Secondary | ICD-10-CM | POA: Diagnosis not present

## 2014-10-02 DIAGNOSIS — Z7951 Long term (current) use of inhaled steroids: Secondary | ICD-10-CM | POA: Diagnosis not present

## 2014-10-02 DIAGNOSIS — E669 Obesity, unspecified: Secondary | ICD-10-CM | POA: Insufficient documentation

## 2014-10-02 DIAGNOSIS — Z91040 Latex allergy status: Secondary | ICD-10-CM | POA: Insufficient documentation

## 2014-10-02 DIAGNOSIS — R404 Transient alteration of awareness: Secondary | ICD-10-CM | POA: Diagnosis not present

## 2014-10-02 DIAGNOSIS — E119 Type 2 diabetes mellitus without complications: Secondary | ICD-10-CM | POA: Diagnosis not present

## 2014-10-02 DIAGNOSIS — G4733 Obstructive sleep apnea (adult) (pediatric): Secondary | ICD-10-CM | POA: Insufficient documentation

## 2014-10-02 DIAGNOSIS — K529 Noninfective gastroenteritis and colitis, unspecified: Secondary | ICD-10-CM | POA: Diagnosis not present

## 2014-10-02 DIAGNOSIS — R112 Nausea with vomiting, unspecified: Secondary | ICD-10-CM | POA: Diagnosis not present

## 2014-10-02 DIAGNOSIS — Z8659 Personal history of other mental and behavioral disorders: Secondary | ICD-10-CM | POA: Insufficient documentation

## 2014-10-02 DIAGNOSIS — R109 Unspecified abdominal pain: Secondary | ICD-10-CM | POA: Diagnosis present

## 2014-10-02 LAB — BASIC METABOLIC PANEL
ANION GAP: 11 (ref 5–15)
BUN: 7 mg/dL (ref 6–20)
CHLORIDE: 104 mmol/L (ref 101–111)
CO2: 24 mmol/L (ref 22–32)
CREATININE: 0.79 mg/dL (ref 0.44–1.00)
Calcium: 9.4 mg/dL (ref 8.9–10.3)
GFR calc Af Amer: >60 mL/min (ref >60)
GFR calc non Af Amer: >60 mL/min (ref >60)
Glucose, Bld: 119 mg/dL — ABNORMAL HIGH (ref 70–99)
Potassium: 3.4 mmol/L — ABNORMAL LOW (ref 3.5–5.1)
SODIUM: 139 mmol/L (ref 135–145)

## 2014-10-02 LAB — CBC WITH DIFFERENTIAL/PLATELET
Basophils Absolute: 0 10*3/uL (ref 0.0–0.1)
Basophils Relative: 0 % (ref 0–1)
Eosinophils Absolute: 0 10*3/uL (ref 0.0–0.7)
Eosinophils Relative: 0 % (ref 0–5)
HEMATOCRIT: 40.2 % (ref 36.0–46.0)
HEMOGLOBIN: 12.5 g/dL (ref 12.0–15.0)
Lymphocytes Relative: 8 % — ABNORMAL LOW (ref 12–46)
Lymphs Abs: 1.6 10*3/uL (ref 0.7–4.0)
MCH: 22.8 pg — ABNORMAL LOW (ref 26.0–34.0)
MCHC: 31.1 g/dL (ref 30.0–36.0)
MCV: 73.2 fL — ABNORMAL LOW (ref 78.0–100.0)
MONO ABS: 1.2 10*3/uL — AB (ref 0.1–1.0)
MONOS PCT: 6 % (ref 3–12)
NEUTROS ABS: 17.4 10*3/uL — AB (ref 1.7–7.7)
Neutrophils Relative %: 86 % — ABNORMAL HIGH (ref 43–77)
Platelets: 251 10*3/uL (ref 150–400)
RBC: 5.49 MIL/uL — ABNORMAL HIGH (ref 3.87–5.11)
RDW: 16.5 % — ABNORMAL HIGH (ref 11.5–15.5)
WBC: 20.2 10*3/uL — ABNORMAL HIGH (ref 4.0–10.5)

## 2014-10-02 LAB — I-STAT CG4 LACTIC ACID, ED: Lactic Acid, Venous: 2.63 mmol/L (ref 0.5–2.0)

## 2014-10-02 LAB — TROPONIN I: Troponin I: <0.03 ng/mL (ref <0.031)

## 2014-10-02 MED ORDER — CIPROFLOXACIN HCL 500 MG PO TABS
500.0000 mg | ORAL_TABLET | Freq: Once | ORAL | Status: AC
  Administered 2014-10-02: 500 mg via ORAL
  Filled 2014-10-02: qty 1

## 2014-10-02 MED ORDER — CIPROFLOXACIN HCL 500 MG PO TABS: 500.0000 mg | ORAL_TABLET | Freq: Two times a day (BID) | ORAL | Status: DC

## 2014-10-02 MED ORDER — SODIUM CHLORIDE 0.9 % IV SOLN
1000.0000 mL | INTRAVENOUS | Status: DC
  Administered 2014-10-02: 1000 mL via INTRAVENOUS

## 2014-10-02 MED ORDER — SODIUM CHLORIDE 0.9 % IV SOLN
1000.0000 mL | Freq: Once | INTRAVENOUS | Status: AC
  Administered 2014-10-02: 1000 mL via INTRAVENOUS

## 2014-10-02 MED ORDER — ONDANSETRON HCL 4 MG PO TABS: 4.0000 mg | ORAL_TABLET | Freq: Four times a day (QID) | ORAL | Status: DC | PRN

## 2014-10-02 NOTE — ED Notes (Signed)
Pt arrive by [facility] from home for 7/10 lower abd pain, nausea, vomiting and diarrhea and chills, denies any fever. Pt states she pass out on the bathroom and fail hit her top lip.

## 2014-10-02 NOTE — ED Provider Notes (Signed)
CSN: [id number]     Arrival date & time 10/02/14  0451 History   First MD Initiated Contact with Patient 10/02/14 [phone]     Chief Complaint  Patient presents with  . Abdominal Pain  . Nausea  . Emesis  . Diarrhea     (Consider location/radiation/quality/duration/timing/severity/associated sxs/prior Treatment) Patient is a 39 y.o. female presenting with abdominal pain, vomiting, and diarrhea. The history is provided by the patient.  Abdominal Pain Associated symptoms: diarrhea and vomiting   Emesis Associated symptoms: abdominal pain and diarrhea   Diarrhea Associated symptoms: abdominal pain and vomiting   She had onset yesterday of crampy abdominal pain, diarrhea, nausea with associated chills and subjective fever. Symptoms seem to subside but then she was awakened at 3 AM with nausea, vomiting, diarrhea. Of note, there was a small amount of blood present in the stool. She was in the bathroom and she got lightheaded and passed out. There is associated diaphoresis. She came in by ambulance and received a dose of ondansetron and states she is feeling much better. She estimates 10 watery bowel movements during the course of the day. She had not vomited until she woke up at 3 AM. She denies any sick contacts.  Past Medical History  Diagnosis Date  . Anxiety   . Depression   . Headache(784.0)   . Psychosis   . Schizoaffective disorder   . Mania   . Obesity   . PTSD (post-traumatic stress disorder)   . High blood pressure     off meds at this time  . Diabetes mellitus     notes borderline  . Hyperlipidemia   . OSA on CPAP 03/18/2013   Past Surgical History  Procedure Laterality Date  . Fracture surgery    . Cholecystectomy    . Broken ankle  2000   Family History  Problem Relation Age of Onset  . Alcohol abuse Mother   . Depression Mother   . Alcohol abuse Brother   . Suicidality Maternal Grandmother   . Anorexia nervosa Maternal Grandmother   . Asperger's syndrome  Daughter   . Schizophrenia Father   . Brain cancer Father   . Early death Father    History  Substance Use Topics  . Smoking status: Never Smoker   . Smokeless tobacco: Never Used  . Alcohol Use: No   OB History    No data available     Review of Systems  Gastrointestinal: Positive for vomiting, abdominal pain and diarrhea.  All other systems reviewed and are negative.     Allergies  Latex; Penicillins; Tegretol; and Vicodin  Home Medications   Prior to Admission medications   Medication Sig Start Date End Date Taking? Authorizing Provider  acetaZOLAMIDE (DIAMOX) 500 MG capsule Take 1 capsule (500 mg total) by mouth 2 (two) times daily. 1 pill nightly at bedtime for 1 week, then 1 pill twice daily thereafter. 04/16/13   Star Age, MD  cyclobenzaprine (FLEXERIL) 10 MG tablet Take 1 tablet (10 mg total) by mouth at bedtime. 07/29/13   Midge Minium, MD  FIBER PO Take by mouth.    Historical Provider, MD  fluticasone (FLONASE) 50 MCG/ACT nasal spray Place 1 spray into the nose at bedtime. 03/18/13   Star Age, MD  meclizine (ANTIVERT) 50 MG tablet Take 1 tablet (50 mg total) by mouth 3 (three) times daily as needed. 03/08/13   Midge Minium, MD  MELATONIN PO Take by mouth.    Historical Provider, MD  meloxicam (MOBIC) 15 MG tablet TAKE 1 TABLET BY MOUTH EVERY DAY 02/23/14   Midge Minium, MD  Multiple Vitamin (MULTIVITAMIN) tablet Take 1 tablet by mouth daily.    Historical Provider, MD  topiramate (TOPAMAX) 50 MG tablet Take 2 tablets (100 mg total) by mouth 2 (two) times daily. Follow titration schedule provided. 08/25/13   Star Age, MD   BP 116/50 mmHg  Pulse 79  Temp(Src) 97.8 F (36.6 C) (Oral)  Resp 21  Ht 5\' 7"  (1.702 m)  Wt 347 lb (157.398 kg)  BMI 54.34 kg/m2  SpO2 96%  LMP 08/02/2014 Physical Exam  Nursing note and vitals reviewed.  Obese 39 year old female, resting comfortably and in no acute distress. Vital signs are significant for  borderline tachypnea. Oxygen saturation is 96%, which is normal. Head is normocephalic and atraumatic. PERRLA, EOMI. Oropharynx is clear. Neck is nontender and supple without adenopathy or JVD. Back is nontender and there is no CVA tenderness. Lungs are clear without rales, wheezes, or rhonchi. Chest is nontender. Heart has regular rate and rhythm without murmur. Abdomen is soft, flat, nontender without masses or hepatosplenomegaly and peristalsis is hypoactive. Extremities have no cyanosis or edema, full range of motion is present. Skin is warm and dry without rash. Neurologic: Mental status is normal, cranial nerves are intact, there are no motor or sensory deficits. ED Course  Procedures (including critical care time) Labs Review Results for orders placed or performed during the hospital encounter of 81/27/51  Basic metabolic panel  Result Value Ref Range   Sodium 139 135 - 145 mmol/L   Potassium 3.4 (L) 3.5 - 5.1 mmol/L   Chloride 104 101 - 111 mmol/L   CO2 24 22 - 32 mmol/L   Glucose, Bld 119 (H) 70 - 99 mg/dL   BUN 7 6 - 20 mg/dL   Creatinine, Ser 0.79 0.44 - 1.00 mg/dL   Calcium 9.4 8.9 - 10.3 mg/dL   GFR calc non Af Amer >60 >60 mL/min   GFR calc Af Amer >60 >60 mL/min   Anion gap 11 5 - 15  Troponin I  Result Value Ref Range   Troponin I <0.03 <0.031 ng/mL  CBC with Differential  Result Value Ref Range   WBC 20.2 (H) 4.0 - 10.5 K/uL   RBC 5.49 (H) 3.87 - 5.11 MIL/uL   Hemoglobin 12.5 12.0 - 15.0 g/dL   HCT 40.2 36.0 - 46.0 %   MCV 73.2 (L) 78.0 - 100.0 fL   MCH 22.8 (L) 26.0 - 34.0 pg   MCHC 31.1 30.0 - 36.0 g/dL   RDW 16.5 (H) 11.5 - 15.5 %   Platelets 251 150 - 400 K/uL   Neutrophils Relative % 86 (H) 43 - 77 %   Neutro Abs 17.4 (H) 1.7 - 7.7 K/uL   Lymphocytes Relative 8 (L) 12 - 46 %   Lymphs Abs 1.6 0.7 - 4.0 K/uL   Monocytes Relative 6 3 - 12 %   Monocytes Absolute 1.2 (H) 0.1 - 1.0 K/uL   Eosinophils Relative 0 0 - 5 %   Eosinophils Absolute 0.0 0.0 -  0.7 K/uL   Basophils Relative 0 0 - 1 %   Basophils Absolute 0.0 0.0 - 0.1 K/uL  I-Stat CG4 Lactic Acid, ED  Result Value Ref Range   Lactic Acid, Venous 2.63 (HH) 0.5 - 2.0 mmol/L   Comment NOTIFIED PHYSICIAN     EKG Interpretation   Date/Time:  Sunday Oct 02 2014 04:52:21  EDT Ventricular Rate:  78 PR Interval:  172 QRS Duration: 103 QT Interval:  394 QTC Calculation: 449 R Axis:   178 Text Interpretation:  Sinus rhythm RVH with secondary repolarization abnrm  Anterolateral infarct, age indeterminate Abnormal T, consider ischemia,  lateral leads Lead(s) aVF were not used for morphology analysis When  compared with ECG of 03/19/2009, Right ventricular hypertrophy with  secondary repolarization abnormality is now Present Confirmed by [facility]   MD, Reign Dziuba ([id number]) on 10/02/2014 5:26:06 AM      MDM   Final diagnoses:  Gastroenteritis   Vomiting and diarrhea in pattern most consistent with viral gastroenteritis. Syncope seems most consistent with vasovagal syncope. She will be given IV fluids and screening labs obtained. She currently is not having nausea and no sense that she is going to have more diarrhea, so anti-motility agents are being withheld.  She feels much better after above noted treatment. WBC is noted to be quite high at over 20,000 with left shift. Slight elevation of lactic acid was noted prior to rehydration. Patient looks well and has normal heart rate and is feeling much better. Decision is made to treat her for possible bacterial dysentery. She is discharged with prescription for ciprofloxacin and ondansetron and she is to follow-up with her PCP in 2 days if not improving but return to the ED if symptoms are worsening.  Delora Fuel, MD 69/62/95 [id number]

## 2014-10-02 NOTE — Discharge Instructions (Signed)
Return if symptoms are getting worse.  Bloody Diarrhea Bloody diarrhea can be caused by many different conditions. Most of the time bloody diarrhea is the result of food poisoning or minor infections. Bloody diarrhea usually improves over 2 to 3 days of rest and fluid replacement. Other conditions that can cause bloody diarrhea include:  Internal bleeding.  Infection.  Diseases of the bowel and colon. Internal bleeding from an ulcer or bowel disease can be severe and requires hospital care or even surgery. DIAGNOSIS  To find out what is wrong your caregiver may check your:  Stool.  Blood.  Results from a test that looks inside the body (endoscopy). TREATMENT   Get plenty of rest.  Drink enough water and fluids to keep your urine clear or pale yellow.  Do not smoke.  Solid foods and dairy products should be avoided until your illness improves.  As you improve, slowly return to a regular diet with easily-digested foods first. Examples are:  Bananas.  Rice.  Toast.  Crackers. You should only need these for about 2 days before adding more normal foods to your diet.  Avoid spicy or fatty foods as well as caffeine and alcohol for several days.  Medicine to control cramping and diarrhea can relieve symptoms but may prolong some cases of bloody diarrhea. Antibiotics can speed recovery from diarrhea due to some bacterial infections. Call your caregiver if diarrhea does not get better in 3 days. SEEK MEDICAL CARE IF:   You do not improve after 3 days.  Your diarrhea improves but your stool appears black. SEEK IMMEDIATE MEDICAL CARE IF:   You become extremely weak or faint.  You become very sweaty.  You have increased pain or bleeding.  You develop repeated vomiting.  You vomit and you see blood or the vomit looks black in color.  You have a fever. Document Released: 05/13/2005 Document Revised: 08/05/2011 Document Reviewed: 04/14/2009 [facility] Patient Information  2015 [place], [place]. This information is not intended to replace advice given to you by your health care provider. Make sure you discuss any questions you have with your health care provider.  Nausea and Vomiting Nausea is a sick feeling that often comes before throwing up (vomiting). Vomiting is a reflex where stomach contents come out of your mouth. Vomiting can cause severe loss of body fluids (dehydration). Children and elderly adults can become dehydrated quickly, especially if they also have diarrhea. Nausea and vomiting are symptoms of a condition or disease. It is important to find the cause of your symptoms. CAUSES   Direct irritation of the stomach lining. This irritation can result from increased acid production (gastroesophageal reflux disease), infection, food poisoning, taking certain medicines (such as nonsteroidal anti-inflammatory drugs), alcohol use, or tobacco use.  Signals from the brain.These signals could be caused by a headache, heat exposure, an inner ear disturbance, increased pressure in the brain from injury, infection, a tumor, or a concussion, pain, emotional stimulus, or metabolic problems.  An obstruction in the gastrointestinal tract (bowel obstruction).  Illnesses such as diabetes, hepatitis, gallbladder problems, appendicitis, kidney problems, cancer, sepsis, atypical symptoms of a heart attack, or eating disorders.  Medical treatments such as chemotherapy and radiation.  Receiving medicine that makes you sleep (general anesthetic) during surgery. DIAGNOSIS Your caregiver may ask for tests to be done if the problems do not improve after a few days. Tests may also be done if symptoms are severe or if the reason for the nausea and vomiting is not clear. Tests  may include:  Urine tests.  Blood tests.  Stool tests.  Cultures (to look for evidence of infection).  X-rays or other imaging studies. Test results can help your caregiver make decisions about  treatment or the need for additional tests. TREATMENT You need to stay well hydrated. Drink frequently but in small amounts.You may wish to drink water, sports drinks, clear broth, or eat frozen ice pops or gelatin dessert to help stay hydrated.When you eat, eating slowly may help prevent nausea.There are also some antinausea medicines that may help prevent nausea. HOME CARE INSTRUCTIONS   Take all medicine as directed by your caregiver.  If you do not have an appetite, do not force yourself to eat. However, you must continue to drink fluids.  If you have an appetite, eat a normal diet unless your caregiver tells you differently.  Eat a variety of complex carbohydrates (rice, wheat, potatoes, bread), lean meats, yogurt, fruits, and vegetables.  Avoid high-fat foods because they are more difficult to digest.  Drink enough water and fluids to keep your urine clear or pale yellow.  If you are dehydrated, ask your caregiver for specific rehydration instructions. Signs of dehydration may include:  Severe thirst.  Dry lips and mouth.  Dizziness.  Dark urine.  Decreasing urine frequency and amount.  Confusion.  Rapid breathing or pulse. SEEK IMMEDIATE MEDICAL CARE IF:   You have blood or brown flecks (like coffee grounds) in your vomit.  You have black or bloody stools.  You have a severe headache or stiff neck.  You are confused.  You have severe abdominal pain.  You have chest pain or trouble breathing.  You do not urinate at least once every 8 hours.  You develop cold or clammy skin.  You continue to vomit for longer than 24 to 48 hours.  You have a fever. MAKE SURE YOU:   Understand these instructions.  Will watch your condition.  Will get help right away if you are not doing well or get worse. Document Released: 05/13/2005 Document Revised: 08/05/2011 Document Reviewed: 10/10/2010 [facility] Patient Information 2015 [place], [place]. This information is not  intended to replace advice given to you by your health care provider. Make sure you discuss any questions you have with your health care provider.  Ciprofloxacin tablets What is this medicine? CIPROFLOXACIN (sip roe FLOX a sin) is a quinolone antibiotic. It is used to treat certain kinds of bacterial infections. It will not work for colds, flu, or other viral infections. This medicine may be used for other purposes; ask your health care provider or pharmacist if you have questions. COMMON BRAND NAME(S): Cipro What should I tell my health care provider before I take this medicine? They need to know if you have any of these conditions: -bone problems -cerebral disease -joint problems -irregular heartbeat -kidney disease -liver disease -myasthenia gravis -seizure disorder -tendon problems -an unusual or allergic reaction to ciprofloxacin, other antibiotics or medicines, foods, dyes, or preservatives -pregnant or trying to get pregnant -breast-feeding How should I use this medicine? Take this medicine by mouth with a glass of water. Follow the directions on the prescription label. Take your medicine at regular intervals. Do not take your medicine more often than directed. Take all of your medicine as directed even if you think your are better. Do not skip doses or stop your medicine early. You can take this medicine with food or on an empty stomach. It can be taken with a meal that contains dairy or calcium,  but do not take it alone with a dairy product, like milk or yogurt or calcium-fortified juice. A special MedGuide will be given to you by the pharmacist with each prescription and refill. Be sure to read this information carefully each time. Talk to your pediatrician regarding the use of this medicine in children. Special care may be needed. Overdosage: If you think you have taken too much of this medicine contact a poison control center or emergency room at once. NOTE: This medicine is  only for you. Do not share this medicine with others. What if I miss a dose? If you miss a dose, take it as soon as you can. If it is almost time for your next dose, take only that dose. Do not take double or extra doses. What may interact with this medicine? Do not take this medicine with any of the following medications: -cisapride -droperidol -terfenadine -tizanidine This medicine may also interact with the following medications: -antacids -birth control pills -caffeine -cyclosporin -didanosine (ddI) buffered tablets or powder -medicines for diabetes -medicines for inflammation like ibuprofen, naproxen -methotrexate -multivitamins -omeprazole -phenytoin -probenecid -sucralfate -theophylline -warfarin This list may not describe all possible interactions. Give your health care provider a list of all the medicines, herbs, non-prescription drugs, or dietary supplements you use. Also tell them if you smoke, drink alcohol, or use illegal drugs. Some items may interact with your medicine. What should I watch for while using this medicine? Tell your doctor or health care professional if your symptoms do not improve. Do not treat diarrhea with over the counter products. Contact your doctor if you have diarrhea that lasts more than 2 days or if it is severe and watery. You may get drowsy or dizzy. Do not drive, use machinery, or do anything that needs mental alertness until you know how this medicine affects you. Do not stand or sit up quickly, especially if you are an older patient. This reduces the risk of dizzy or fainting spells. This medicine can make you more sensitive to the sun. Keep out of the sun. If you cannot avoid being in the sun, wear protective clothing and use sunscreen. Do not use sun lamps or tanning beds/booths. Avoid antacids, aluminum, calcium, iron, magnesium, and zinc products for 6 hours before and 2 hours after taking a dose of this medicine. What side effects may I  notice from receiving this medicine? Side effects that you should report to your doctor or health care professional as soon as possible: - allergic reactions like skin rash, itching or hives, swelling of the face, lips, or tongue - breathing problems - confusion, nightmares or hallucinations - feeling faint or lightheaded, falls - irregular heartbeat - joint, muscle or tendon pain or swelling - pain or trouble passing urine -persistent headache with or without blurred vision - redness, blistering, peeling or loosening of the skin, including inside the mouth - seizure - unusual pain, numbness, tingling, or weakness Side effects that usually do not require medical attention (report to your doctor or health care professional if they continue or are bothersome): - diarrhea - nausea or stomach upset - white patches or sores in the mouth This list may not describe all possible side effects. Call your doctor for medical advice about side effects. You may report side effects to FDA at 1-800-FDA-1088. Where should I keep my medicine? Keep out of the reach of children. Store at room temperature below 30 degrees C (86 degrees F). Keep container tightly closed. Throw away any unused  medicine after the expiration date. NOTE: This sheet is a summary. It may not cover all possible information. If you have questions about this medicine, talk to your doctor, pharmacist, or health care provider.  2015, [employer]vier/[employer] Standard. (2012-12-17 16:10:46)  Ondansetron tablets What is this medicine? ONDANSETRON (on DAN se tron) is used to treat nausea and vomiting caused by chemotherapy. It is also used to prevent or treat nausea and vomiting after surgery. This medicine may be used for other purposes; ask your health care provider or pharmacist if you have questions. COMMON BRAND NAME(S): Zofran What should I tell my health care provider before I take this medicine? They need to know if you have  any of these conditions: -heart disease -history of irregular heartbeat -liver disease -low levels of magnesium or potassium in the blood -an unusual or allergic reaction to ondansetron, granisetron, other medicines, foods, dyes, or preservatives -pregnant or trying to get pregnant -breast-feeding How should I use this medicine? Take this medicine by mouth with a glass of water. Follow the directions on your prescription label. Take your doses at regular intervals. Do not take your medicine more often than directed. Talk to your pediatrician regarding the use of this medicine in children. Special care may be needed. Overdosage: If you think you have taken too much of this medicine contact a poison control center or emergency room at once. NOTE: This medicine is only for you. Do not share this medicine with others. What if I miss a dose? If you miss a dose, take it as soon as you can. If it is almost time for your next dose, take only that dose. Do not take double or extra doses. What may interact with this medicine? Do not take this medicine with any of the following medications: -apomorphine -certain medicines for fungal infections like fluconazole, itraconazole, ketoconazole, posaconazole, voriconazole -cisapride -dofetilide -dronedarone -pimozide -thioridazine -ziprasidone This medicine may also interact with the following medications: -carbamazepine -certain medicines for depression, anxiety, or psychotic disturbances -fentanyl -linezolid -MAOIs like Carbex, Eldepryl, Marplan, Nardil, and Parnate -methylene blue (injected into a vein) -other medicines that prolong the QT interval (cause an abnormal heart rhythm) -phenytoin -rifampicin -tramadol This list may not describe all possible interactions. Give your health care provider a list of all the medicines, herbs, non-prescription drugs, or dietary supplements you use. Also tell them if you smoke, drink alcohol, or use illegal  drugs. Some items may interact with your medicine. What should I watch for while using this medicine? Check with your doctor or health care professional right away if you have any sign of an allergic reaction. What side effects may I notice from receiving this medicine? Side effects that you should report to your doctor or health care professional as soon as possible: -allergic reactions like skin rash, itching or hives, swelling of the face, lips or tongue -breathing problems -confusion -dizziness -fast or irregular heartbeat -feeling faint or lightheaded, falls -fever and chills -loss of balance or coordination -seizures -sweating -swelling of the hands or feet -tightness in the chest -tremors -unusually weak or tired Side effects that usually do not require medical attention (report to your doctor or health care professional if they continue or are bothersome): -constipation or diarrhea -headache This list may not describe all possible side effects. Call your doctor for medical advice about side effects. You may report side effects to FDA at 1-800-FDA-1088. Where should I keep my medicine? Keep out of the reach of children. Store between 2 and  30 degrees C (36 and 86 degrees F). Throw away any unused medicine after the expiration date. NOTE: This sheet is a summary. It may not cover all possible information. If you have questions about this medicine, talk to your doctor, pharmacist, or health care provider.  2015, [employer]vier/Gold Standard. (2013-02-17 16:27:45)

## 2014-10-02 NOTE — ED Notes (Signed)
Pt reports that she is feeling much better. NAD at this time.

## 2014-11-03 ENCOUNTER — Encounter: Payer: Self-pay | Admitting: Family Medicine

## 2014-11-03 ENCOUNTER — Ambulatory Visit (INDEPENDENT_AMBULATORY_CARE_PROVIDER_SITE_OTHER): Payer: Medicare Other | Admitting: Family Medicine

## 2014-11-03 VITALS — BP 126/80 | HR 78 | Temp 98.0°F | Resp 16 | Wt 324.4 lb

## 2014-11-03 DIAGNOSIS — R1013 Epigastric pain: Secondary | ICD-10-CM | POA: Diagnosis not present

## 2014-11-03 LAB — BASIC METABOLIC PANEL
BUN: 12 mg/dL (ref 6–23)
CO2: 28 meq/L (ref 19–32)
Calcium: 9.5 mg/dL (ref 8.4–10.5)
Chloride: 105 mEq/L (ref 96–112)
Creatinine, Ser: 0.55 mg/dL (ref 0.40–1.20)
GFR: 131.47 mL/min (ref >60.00)
Glucose, Bld: 87 mg/dL (ref 70–99)
Potassium: 3.9 mEq/L (ref 3.5–5.1)
Sodium: 138 mEq/L (ref 135–145)

## 2014-11-03 LAB — CBC WITH DIFFERENTIAL/PLATELET
BASOS ABS: 0 10*3/uL (ref 0.0–0.1)
Basophils Relative: 0.5 % (ref 0.0–3.0)
Eosinophils Absolute: 0.2 10*3/uL (ref 0.0–0.7)
Eosinophils Relative: 2.8 % (ref 0.0–5.0)
HCT: 38.5 % (ref 36.0–46.0)
HEMOGLOBIN: 12.3 g/dL (ref 12.0–15.0)
Lymphocytes Relative: 24.8 % (ref 12.0–46.0)
Lymphs Abs: 2.2 10*3/uL (ref 0.7–4.0)
MCHC: 32 g/dL (ref 30.0–36.0)
MCV: 74 fl — ABNORMAL LOW (ref 78.0–100.0)
MONO ABS: 0.3 10*3/uL (ref 0.1–1.0)
Monocytes Relative: 3.7 % (ref 3.0–12.0)
NEUTROS ABS: 6 10*3/uL (ref 1.4–7.7)
Neutrophils Relative %: 68.2 % (ref 43.0–77.0)
PLATELETS: 259 10*3/uL (ref 150.0–400.0)
RBC: 5.2 Mil/uL — ABNORMAL HIGH (ref 3.87–5.11)
RDW: 19.3 % — AB (ref 11.5–15.5)
WBC: 8.7 10*3/uL (ref 4.0–10.5)

## 2014-11-03 LAB — HEPATIC FUNCTION PANEL
ALBUMIN: 4.2 g/dL (ref 3.5–5.2)
ALK PHOS: 90 U/L (ref 39–117)
ALT: 14 U/L (ref 0–35)
AST: 11 U/L (ref 0–37)
Bilirubin, Direct: 0.2 mg/dL (ref 0.0–0.3)
Total Bilirubin: 0.9 mg/dL (ref 0.2–1.2)
Total Protein: 7.6 g/dL (ref 6.0–8.3)

## 2014-11-03 LAB — AMYLASE: AMYLASE: 50 U/L (ref 27–131)

## 2014-11-03 LAB — LIPASE: LIPASE: 30 U/L (ref 11.0–59.0)

## 2014-11-03 LAB — H. PYLORI ANTIBODY, IGG: H Pylori IgG: NEGATIVE

## 2014-11-03 NOTE — Progress Notes (Signed)
   Subjective:    Patient ID: Michaela Norris, female    DOB: August 03, 1975, 39 y.o.   MRN: [id number]  HPI ER f/u- pt was seen in ER 5/8 and dx'd w/ gastroenteritis.  Pt reports sxs feel like an '18 hr bug but it just keeps happening'.  Occurred on 5/7, 5/28, 6/4.  sxs start at 10am w/ 'horrible painful gas'.  Afternoon has 'nausea and watery diarrhea'.  2am will wake her from sleep w/ 'horrible stomach pain' and resolved by AM.  1st occurrence had 'a lot of blood in the stool', pt passed out falling 'right on my face', mom called ambulance and pt went to ER.  Subsequent episodes have not had blood.  1st occurrence her sister-in-law had similar sxs and ER dx'd food poisoning.  Pt's WBC at the time was 20.2 and lactic acid was 2.63.  Sent home on Cipro.  Pt's abd pain is epigastric and feels like 'a terrible burning all the way around'.  Had vomiting w/ 1st episode but none since.   Review of Systems For ROS see HPI     Objective:   Physical Exam  Constitutional: She is oriented to person, place, and time. She appears well-developed and well-nourished. No distress.  obese  HENT:  Head: Normocephalic and atraumatic.  Eyes: Conjunctivae and EOM are normal. Pupils are equal, round, and reactive to light.  Neck: Normal range of motion. Neck supple. No thyromegaly present.  Cardiovascular: Normal rate, regular rhythm, normal heart sounds and intact distal pulses.   No murmur heard. Pulmonary/Chest: Effort normal and breath sounds normal. No respiratory distress.  Abdominal: Soft. She exhibits no distension. There is no tenderness. There is no rebound and no guarding.  Musculoskeletal: She exhibits no edema.  Lymphadenopathy:    She has no cervical adenopathy.  Neurological: She is alert and oriented to person, place, and time.  Skin: Skin is warm and dry.  Psychiatric: She has a normal mood and affect. Her behavior is normal.  Vitals reviewed.         Assessment & Plan:

## 2014-11-03 NOTE — Assessment & Plan Note (Signed)
New.  Pt has had 3 discrete episodes w/ severe pain- the first w/ markedly elevated WBC and lactate.  Since then, episodes have been less severe but consistent in the timing and pattern.  Based on description, sounds like acute pancreatitis but the pain and diarrhea are self limiting and resolve in <24 hrs.  Repeat labs today.  If pt has repeat episode will need GI referral and likely imaging.  Reviewed supportive care and red flags that should prompt return.  Pt expressed understanding and is in agreement w/ plan.

## 2014-11-03 NOTE — Progress Notes (Signed)
Pre visit review using our clinic review tool, if applicable. No additional management support is needed unless otherwise documented below in the visit note. 

## 2014-11-03 NOTE — Patient Instructions (Signed)
Follow up as needed- particularly if you have another episode If symptoms are severe, go to the ER We'll notify you of your lab results and make any changes if needed Try and avoid fatty foods, alcohol Drink plenty of water Call with any questions or concerns Hang in there!!

## 2014-11-04 ENCOUNTER — Telehealth: Payer: Self-pay | Admitting: Family Medicine

## 2014-11-04 NOTE — Telephone Encounter (Signed)
Pt notified that labs were sent to mychart. Advised of results.

## 2014-11-04 NOTE — Telephone Encounter (Signed)
Relation to LT:JQZE Call back number: [phone]   Reason for call:  Pt inquiring about lab results taken 11/03/2014

## 2014-12-06 DIAGNOSIS — F251 Schizoaffective disorder, depressive type: Secondary | ICD-10-CM | POA: Diagnosis not present

## 2014-12-06 DIAGNOSIS — F845 Asperger's syndrome: Secondary | ICD-10-CM | POA: Diagnosis not present

## 2014-12-20 DIAGNOSIS — F251 Schizoaffective disorder, depressive type: Secondary | ICD-10-CM | POA: Diagnosis not present

## 2014-12-20 DIAGNOSIS — F845 Asperger's syndrome: Secondary | ICD-10-CM | POA: Diagnosis not present

## 2015-01-03 DIAGNOSIS — F251 Schizoaffective disorder, depressive type: Secondary | ICD-10-CM | POA: Diagnosis not present

## 2015-01-03 DIAGNOSIS — F845 Asperger's syndrome: Secondary | ICD-10-CM | POA: Diagnosis not present

## 2015-01-31 DIAGNOSIS — F251 Schizoaffective disorder, depressive type: Secondary | ICD-10-CM | POA: Diagnosis not present

## 2015-02-14 DIAGNOSIS — F251 Schizoaffective disorder, depressive type: Secondary | ICD-10-CM | POA: Diagnosis not present

## 2015-02-15 ENCOUNTER — Ambulatory Visit: Payer: Medicare Other | Admitting: Neurology

## 2015-02-21 ENCOUNTER — Ambulatory Visit (INDEPENDENT_AMBULATORY_CARE_PROVIDER_SITE_OTHER): Payer: Medicare Other | Admitting: Family Medicine

## 2015-02-21 ENCOUNTER — Encounter: Payer: Self-pay | Admitting: Family Medicine

## 2015-02-21 VITALS — BP 124/82 | HR 66 | Temp 98.2°F | Resp 16 | Ht 67.0 in | Wt 322.0 lb

## 2015-02-21 DIAGNOSIS — R7303 Prediabetes: Secondary | ICD-10-CM

## 2015-02-21 DIAGNOSIS — R7309 Other abnormal glucose: Secondary | ICD-10-CM | POA: Diagnosis not present

## 2015-02-21 DIAGNOSIS — R5383 Other fatigue: Secondary | ICD-10-CM | POA: Diagnosis not present

## 2015-02-21 DIAGNOSIS — Z6841 Body Mass Index (BMI) 40.0 and over, adult: Secondary | ICD-10-CM

## 2015-02-21 LAB — CBC WITH DIFFERENTIAL/PLATELET
BASOS PCT: 0.5 % (ref 0.0–3.0)
Basophils Absolute: 0 10*3/uL (ref 0.0–0.1)
EOS PCT: 1.7 % (ref 0.0–5.0)
Eosinophils Absolute: 0.2 10*3/uL (ref 0.0–0.7)
HCT: 38.5 % (ref 36.0–46.0)
Hemoglobin: 12.2 g/dL (ref 12.0–15.0)
Lymphocytes Relative: 28.6 % (ref 12.0–46.0)
Lymphs Abs: 2.6 10*3/uL (ref 0.7–4.0)
MCHC: 31.8 g/dL (ref 30.0–36.0)
MCV: 73.6 fl — ABNORMAL LOW (ref 78.0–100.0)
MONOS PCT: 4.7 % (ref 3.0–12.0)
Monocytes Absolute: 0.4 10*3/uL (ref 0.1–1.0)
Neutro Abs: 5.9 10*3/uL (ref 1.4–7.7)
Neutrophils Relative %: 64.5 % (ref 43.0–77.0)
Platelets: 257 10*3/uL (ref 150.0–400.0)
RBC: 5.23 Mil/uL — ABNORMAL HIGH (ref 3.87–5.11)
RDW: 16.1 % — AB (ref 11.5–15.5)
WBC: 9.1 10*3/uL (ref 4.0–10.5)

## 2015-02-21 LAB — HEPATIC FUNCTION PANEL
ALT: 14 U/L (ref 0–35)
AST: 13 U/L (ref 0–37)
Albumin: 4 g/dL (ref 3.5–5.2)
Alkaline Phosphatase: 83 U/L (ref 39–117)
BILIRUBIN DIRECT: 0.1 mg/dL (ref 0.0–0.3)
BILIRUBIN TOTAL: 0.6 mg/dL (ref 0.2–1.2)
Total Protein: 7.5 g/dL (ref 6.0–8.3)

## 2015-02-21 LAB — BASIC METABOLIC PANEL
BUN: 11 mg/dL (ref 6–23)
CALCIUM: 9.6 mg/dL (ref 8.4–10.5)
CO2: 27 mEq/L (ref 19–32)
Chloride: 107 mEq/L (ref 96–112)
Creatinine, Ser: 0.6 mg/dL (ref 0.40–1.20)
GFR: 118.72 mL/min (ref >60.00)
Glucose, Bld: 100 mg/dL — ABNORMAL HIGH (ref 70–99)
Potassium: 4.3 mEq/L (ref 3.5–5.1)
SODIUM: 141 meq/L (ref 135–145)

## 2015-02-21 LAB — LIPID PANEL
Cholesterol: 112 mg/dL (ref 0–200)
HDL: 29.5 mg/dL — ABNORMAL LOW (ref >39.00)
LDL Cholesterol: 47 mg/dL (ref 0–99)
NONHDL: 82.48
Total CHOL/HDL Ratio: 4
Triglycerides: 175 mg/dL — ABNORMAL HIGH (ref 0.0–149.0)
VLDL: 35 mg/dL (ref 0.0–40.0)

## 2015-02-21 LAB — HEMOGLOBIN A1C: Hgb A1c MFr Bld: 5.8 % (ref 4.6–6.5)

## 2015-02-21 LAB — TSH: TSH: 1.13 u[IU]/mL (ref 0.35–4.50)

## 2015-02-21 NOTE — Progress Notes (Signed)
   Subjective:    Patient ID: Michaela Norris, female    DOB: August 24, 1975, 39 y.o.   MRN: [id number]  HPI Obesity- ongoing problem for pt.  'i eat well'- pt reports she saw nutritionist a few years ago and they told her that her diet 'was fine'.  Has lost 25 lbs and kept it off for 1 year but can't seem to make any additional progress.  Limited ability to exercise due to current size/weight.  Does enjoy walking.  Measuring steps w/ FitBit but regardless of # of steps, no reduction in weight.  Eating small but regular meals each day.  Eating protein, whole foods- is gluten free due to daughter's celiac disease.  Has really limited sugar intake.  Has been tx'd for OSA x2 yrs and 'i feel so much better'.  Pt has been committed to 'not having surgery' but 'i'm at my wit's end'.  'i wish there was a bootcamp or inpatient place'.   Review of Systems For ROS see HPI     Objective:   Physical Exam  Constitutional: She is oriented to person, place, and time. She appears well-developed and well-nourished. No distress.  Morbidly obese  HENT:  Head: Normocephalic and atraumatic.  Eyes: Conjunctivae and EOM are normal. Pupils are equal, round, and reactive to light.  Neck: Normal range of motion. Neck supple. No thyromegaly present.  Cardiovascular: Normal rate, regular rhythm, normal heart sounds and intact distal pulses.   No murmur heard. Pulmonary/Chest: Effort normal and breath sounds normal. No respiratory distress.  Abdominal: Soft. She exhibits no distension. There is no tenderness.  Musculoskeletal: She exhibits no edema.  Lymphadenopathy:    She has no cervical adenopathy.  Neurological: She is alert and oriented to person, place, and time.  Skin: Skin is warm and dry.  Psychiatric: She has a normal mood and affect. Her behavior is normal.  Vitals reviewed.         Assessment & Plan:

## 2015-02-21 NOTE — Patient Instructions (Signed)
Follow up in 3 months to recheck weight loss progress We'll notify you of your lab results and make any changes if needed Continue to exercise regularly and make healthy food choices Check out [email or web address] and let me know what you think or if this is possible Call with any questions or concerns Hang in there!!!

## 2015-02-21 NOTE — Assessment & Plan Note (Signed)
Check A1C in setting of morbid obesity.

## 2015-02-21 NOTE — Assessment & Plan Note (Signed)
Ongoing issue for pt.  She reports she can 'not care and eat whatever I want and weigh 350 lbs or work my butt off and really struggle to weigh 325'.  She is very frustrated by this.  Discussed options- medication, surgery, personal trainer, therapist.  Provided pt w/ information for residential program in [place] for her to investigate.  If this is not feasible for her, will pursue other options.  At this time, she is against medication and surgery- 'but that may change'.  Will continue to follow.

## 2015-02-21 NOTE — Progress Notes (Signed)
Pre visit review using our clinic review tool, if applicable. No additional management support is needed unless otherwise documented below in the visit note. 

## 2015-02-21 NOTE — Assessment & Plan Note (Signed)
Most likely due to morbid obesity and body habitus.  She reports energy improved considerably w/ tx of OSA but continues to have issues.  Check TSH to r/o hypothyroid and CBC to r/o anemia.

## 2015-02-28 DIAGNOSIS — F251 Schizoaffective disorder, depressive type: Secondary | ICD-10-CM | POA: Diagnosis not present

## 2015-03-02 ENCOUNTER — Encounter: Payer: Self-pay | Admitting: Neurology

## 2015-03-02 ENCOUNTER — Ambulatory Visit (INDEPENDENT_AMBULATORY_CARE_PROVIDER_SITE_OTHER): Payer: Medicare Other | Admitting: Neurology

## 2015-03-02 VITALS — BP 128/78 | HR 78 | Resp 16 | Ht 67.0 in | Wt 318.0 lb

## 2015-03-02 DIAGNOSIS — G932 Benign intracranial hypertension: Secondary | ICD-10-CM

## 2015-03-02 DIAGNOSIS — G4733 Obstructive sleep apnea (adult) (pediatric): Secondary | ICD-10-CM | POA: Diagnosis not present

## 2015-03-02 DIAGNOSIS — Z9989 Dependence on other enabling machines and devices: Principal | ICD-10-CM

## 2015-03-02 NOTE — Progress Notes (Signed)
Subjective:    Patient ID: Michaela Norris is a 39 y.o. female.  HPI     Interim history:   Michaela Norris is a 39 year old right-handed woman with an underlying medical history of morbid obesity, Asperger's syndrome, schizo-affective d/o, vertigo, and low back pain, who presents for followup consultation of her recurrent headaches in the contex to pseudotumor cerebri as well as her OSA. She is unaccompanied today. Of note, she no showed for appointment on 01/19/2014 and canceled an appointment for 02/15/2015. I last saw her on 08/25/2013, at which time she reported ongoing paresthesias, secondary to the Diamox affecting her hands and face and perioral area. She was taking a break from school. She was willing to try topamax, but worried about side effects. She is reported compliance with her CPAP use, switching between nasal pillows (caused nasal and sinus congestion) and a FFM (caused severe mouth dryness). She had trouble falling asleep. She reported recently being treated with antibiotics and steroids for upper respiratory infection. Since she was having side effects with Diamox I suggested she taper off of it then gradually start Topamax. Today, 03/02/2015: I reviewed her CPAP compliance data from 01/30/15 through 02/28/15, which is a total of 30 days, during which time she used her CPAP every night, with % used days greater than 4 hours of 100%, indicating superp compliance, with an average usage of 8 h and 23 minutes, residual AHI low at 0.4/h, leak low, with the 95th percentile at 3.4 L/m on a pressure of 11 cm with EPR of 3. Today, 03/02/2015: She reports that she had to go to the ER in May 2016 for food poisening. Her O2 monitor showed desaturations. She has had occasional morning headaches. She was in [place] for about a year with her daughter, who is now back at [employer]. She has not seen her ophthalmologist in some time, maybe 2 years. She has very mild blurry vision and some trouble seeing at night.    Previously:  I reviewed her compliance data from 12/19/2013 through 01/17/2014 which is total of 30 days during which time she used her CPAP every night, percent days greater than 4 hours was 97%, indicating excellent compliance, average usage was 7 hours and 32 minutes, residual AHI very low at 0.4 per hour, leak low at 9 L per minute at the 95th percentile, current pressure at 11 cm with EPR of 3.  I reviewed the patient's CPAP compliance data from 07/27/2013 to 08/25/2013, which is a total of 30 days, during which time the patient used CPAP every day except for 1 day. The average usage for all days was 8 hours and 3 minutes. The percent used days greater than 4 hours was 97 %, indicating excellent compliance. The residual AHI was 0.4 per hour, indicating an appropriate treatment pressure of 11 cwp with EPR of 3. I saw her on 04/16/2013, at which time I talked to her about her her CPAP compliance and encouraged her to use CPAP every night. I encouraged her to continue Diamox and seek followup with ophthalmology. Diamox was causing her paresthesias and we talked about switching her to Topamax in the future but she was reluctant to try it as she was afraid of cognitive side effects. I encouraged her to lose weight.  I reviewed the patient's CPAP compliance data from 05/11/2013 through 06/09/2013 which is a total of 30 days, during which time the patient used CPAP every day. The average usage for all days was 8 hours and  46 minutes. The percent used days greater than 4 hours was 100 %, indicating excellent compliance. The residual AHI was 0.6 per hour, indicating an appropriate treatment pressure of 11 cwp with EPR of 3.  I saw her on 03/18/2013 at which time restarted her on Diamox for pseudotumor cerebri. I also went over diagnostic test results including the lumbar puncture results, the sleep study results, and also discussed how she had been doing on CPAP. She indicated full compliance with CPAP and  good results with using CPAP and felt, she slept better with less EDS. Unfortunately, at the time of her visit in 9/14 I did not have the requested compliance download data available which was supposed to be faxed to [place] by her DME company, [employer].  She has had night time panic attacks before treatment with CPAP and those improved.  She has had more allergy Sx and her diamox caused her numbness in tingling in her face, hands and feet. She had no blurry vision.  I reviewed her compliance data from 02/21/2013 through 03/22/2013 (30 days), during which time she was fully compliant, using CPAP every day. Her percent used days greater than 4 hours was 97%, indicating excellent compliance. Her average usage for all days was 9 hours and 5 minutes. Her residual AHI was low at 1.3 per hour, indicating an appropriate treatment pressure of 11 cm with EPR of 3. Her leak was also not very high.  I first met her on 12/16/2012, at which time I suggested an ophthalmology referral, and a lumbar puncture under fluoroscopic guidance as well as a sleep study based on concern for obstructive sleep apnea.  She had her LP on 03/09/2013: OP was elevated at 38.5 cm water. Following removal of 34 mL of clear CSF, the CP was 11.5 cm water. All lab results routinely done on the CSF were negative.  She had a split-night sleep study on 01/04/2013: baseline sleep efficiency was reduced at 55.4% with a latency to sleep of 17 minutes and wake after sleep onset of 45.5 minutes with moderate sleep fragmentation noted. She had an increased percentage of stage I and stage II sleep, a normal percentage of deep sleep, and absence of REM sleep prior to CPAP initiation. Her total AHI was 34.1 per hour. Her baseline oxygen saturation was 92%, her nadir was 86%. She was then titrated on CPAP from 7 cm to 11 cm of water pressure and on the final pressure she had a residual AHI of 0 per hour, with EPR of 2 for comfort. She was noted to have periodic leg  movements of sleep prior to CPAP at 12.4 per hour but no significant arousals from these. Based on the test results I ordered CPAP for her. She did complain of nasal stuffiness and pressure on her face and nose from the mask. She adjusted to it fairly well, could not tolerate the FFM and overall felt better.  She saw an ophthalmologist at [facility] ophthalmology, and she was told, there was no papilledema. She was told to come back in 2 years.  She had a bout of vertigo recently and she wondered if this is related to the CPAP. It improved after a few days and resolved after a week.  She was diagnosed with probable PTC perhaps 5 years ago, by an ophthalmologist at the time, who found papilledema. She saw 2 other neurologists in our office in the past and was prescribed Diamox by Dr. Brett Fairy in our office and was last seen  by Dr. Brett Fairy in 5/11, at which time she still had not had her LP due to not showing up for the tap and claiming she felt better. She had MRI/MRA/MRV in the past which were negative and she felt better after coming off of Lithium in 2012.  She had SE on Diamox d/t numbness in her hands and feet. She saw an ophthalmologist in 2013, who did not find evidence of Papilledema. She endorsed snoring and has had choking sensations or dreams of breathing pauses and often woke up in a panic and sweaty. She reported EDS before being treated with CPAP.    Her Past Medical History Is Significant For: Past Medical History  Diagnosis Date  . Anxiety   . Depression   . Headache(784.0)   . Psychosis   . Schizoaffective disorder   . Mania ([place])   . Obesity   . PTSD (post-traumatic stress disorder)   . High blood pressure     off meds at this time  . Diabetes mellitus     notes borderline  . Hyperlipidemia   . OSA on CPAP 03/18/2013    Her Past Surgical History Is Significant For: Past Surgical History  Procedure Laterality Date  . Fracture surgery    . Cholecystectomy    . Broken  ankle  2000    Her Family History Is Significant For: Family History  Problem Relation Age of Onset  . Alcohol abuse Mother   . Depression Mother   . Alcohol abuse Brother   . Suicidality Maternal Grandmother   . Anorexia nervosa Maternal Grandmother   . Asperger's syndrome Daughter   . Schizophrenia Father   . Brain cancer Father   . Early death Father     Her Social History Is Significant For: Social History   Social History  . Marital Status: Single    Spouse Name: N/A  . Number of Children: N/A  . Years of Education: N/A   Social History Main Topics  . Smoking status: Never Smoker   . Smokeless tobacco: Never Used  . Alcohol Use: No  . Drug Use: No  . Sexual Activity: Not Currently   Other Topics Concern  . None   Social History Narrative    Her Allergies Are:  Allergies  Allergen Reactions  . Latex Itching and Rash  . Penicillins Swelling    Swelling in hands; states she still takes it as needed  . Tegretol [Carbamazepine] Rash  . Vicodin [Hydrocodone-Acetaminophen] Rash  :   Her Current Medications Are:  Outpatient Encounter Prescriptions as of 03/02/2015  Medication Sig  . cyclobenzaprine (FLEXERIL) 10 MG tablet Take 1 tablet (10 mg total) by mouth at bedtime. (Patient taking differently: Take 10 mg by mouth daily as needed for muscle spasms. )  . meclizine (ANTIVERT) 50 MG tablet Take 1 tablet (50 mg total) by mouth 3 (three) times daily as needed.  . meloxicam (MOBIC) 15 MG tablet TAKE 1 TABLET BY MOUTH EVERY DAY (Patient taking differently: Take 1 tablet daily as needed for pain)  . [DISCONTINUED] ondansetron (ZOFRAN) 4 MG tablet Take 1 tablet (4 mg total) by mouth every 6 (six) hours as needed for nausea or vomiting.   No facility-administered encounter medications on file as of 03/02/2015.  :  Review of Systems:  Out of a complete 14 point review of systems, all are reviewed and negative with the exception of these symptoms as listed below:    Review of Systems  Neurological: Positive for headaches.  Patient reports that she is starting to get headaches in the morning. States that she was in the hospital in May. During that time the pulse ox would alarm several times during the night.     Objective:  Neurologic Exam  Physical Exam Physical Examination:   Filed Vitals:   03/02/15 0837  BP: 128/78  Pulse: 78  Resp: 16    General Examination: The patient is pleasant 39 y.o. female in no acute distress. She is morbidly obese.   HEENT: Normocephalic, atraumatic, pupils are equal, round and reactive to light and accommodation. Funduscopic exam is normal with sharp disc margins noted. No papilledema, that I can see. Extraocular tracking is good without limitation to gaze excursion or nystagmus noted. Normal smooth pursuit is noted. Hearing is grossly intact. Face is symmetric with normal facial animation and normal facial sensation. Speech is clear with no dysarthria noted. There is no hypophonia. There is no lip, neck/head, jaw or voice tremor. Neck is supple with full range of passive and active motion. There are no carotid bruits on auscultation. Oropharynx exam reveals: mild mouth dryness, adequate dental hygiene and moderate airway crowding, due to narrow airway and tonsillar size of 2+. Mallampati is class II. Tongue protrudes centrally and palate elevates symmetrically. Neck size is enlarged. Nasal inspection reveals mild mucosal bogginess and mild erythema. She is coughing.   Chest: Clear to auscultation without wheezing, rhonchi or crackles noted.  Heart: S1+S2+0, regular and normal without murmurs, rubs or gallops noted.   Abdomen: Soft, non-tender and non-distended with normal bowel sounds appreciated on auscultation.  Extremities: There is no pitting edema in the distal lower extremities bilaterally. Pedal pulses are intact.  Skin: Warm and dry without trophic changes noted. There are no varicose  veins.  Musculoskeletal: exam reveals no obvious joint deformities, tenderness or joint swelling or erythema.   Neurologically:  Mental status: The patient is awake, alert and oriented in all 4 spheres. Her memory, attention, language and knowledge are appropriate. There is no aphasia, agnosia, apraxia or anomia. Speech is clear with normal prosody and enunciation. Thought process is linear. Mood is congruent and affect is normal today.   Cranial nerves are as described above under HEENT exam. In addition, shoulder shrug is normal with equal shoulder height noted. Motor exam: Normal bulk, strength and tone is noted. There is no drift, tremor or rebound. Romberg is negative. Reflexes are 1+ throughout. Fine motor skills are intact with normal finger taps, normal hand movements, normal rapid alternating patting, normal foot taps and normal foot agility.  Cerebellar testing shows no dysmetria or intention tremor on finger to nose testing. Heel-to-shin is unremarkable bilaterally. There is no truncal or gait ataxia.  Sensory exam is intact to light touch in the UEs and LEs.   Gait, station and balance are unremarkable. She turns en bloc.  Assessment and Plan:    In summary, Bich Mchaney is a 39 year old female with a history of morbid obesity and PTC as well as obstructive sleep apnea, treated with CPAP who presents for follow-up after a long gap. I last saw her in early 2015. She had moved out of state for a year to stay with her daughter who had taken time off of college. She has been back since June 2016. She was hospitalized in May for food poisoning and despite using her CPAP she noted that the pulse ox would go off. We can check a pulse oximetry test overnight while she uses her CPAP. Regarding  her sleep apnea she is under good control with CPAP of 11 with superb compliance and she is congratulated on her great treatment adherence. She has been struggling with her weight for a very long time.  While in [place] she was able to lose about 25 pounds as she was very active physically and she has been able to keep the weight off that she has lost but it has plateaued. She has recently seen her primary care physician on 02/23/2015 and I reviewed the note. They are talking about other weight loss options including residential option which unfortunately is typically not paid for by the insurance. They are also considering bariatric surgery referral. She would like to avoid surgery if possible but she has really exhausted all her options that she can do herself. She is eating right. She has even gone gluten-free because her daughter has been diagnosed with celiac. She feels better. She has intermittent morning headaches for which she felt take Advil at times. We may have to consider another lumbar puncture as she could not tolerate medication for PTC. She has not seen her ophthalmologist in some time. She is advised to make a follow-up appointment with them. She is advised to call or email back after that appointment. If she does indeed have evidence of papilledema we may have to pursue another lumbar puncture. She is advised to continue to be fully compliant with CPAP therapy and continue to strive for weight loss as she is. I will see her back routinely in 6 months, sooner if needed and she will be in touch after her ophthalmology appointment.  I answered all her questions today and she was in agreement. I spent 25 minutes in total face-to-face time with the patient, more than 50% of which was spent in counseling and coordination of care, reviewing test results, reviewing medication and discussing or reviewing the diagnosis of PTC, OSA, obesity, the prognosis and treatment options.

## 2015-03-02 NOTE — Patient Instructions (Addendum)
Please continue using your CPAP regularly. While your insurance requires that you use CPAP at least 4 hours each night on 70% of the nights, I recommend, that you not skip any nights and use it throughout the night if you can. Getting used to CPAP and staying with the treatment long term does take time and patience and discipline. Untreated obstructive sleep apnea when it is moderate to severe can have an adverse impact on cardiovascular health and raise her risk for heart disease, arrhythmias, hypertension, congestive heart failure, stroke and diabetes. Untreated obstructive sleep apnea causes sleep disruption, nonrestorative sleep, and sleep deprivation. This can have an impact on your day to day functioning and cause daytime sleepiness and impairment of cognitive function, memory loss, mood disturbance, and problems focussing. Using CPAP regularly can improve these symptoms.  Keep up the good work! I will see you back in 6 months for sleep apnea and headache check up.  We will do an overnight pulse oximetry test with CPAP on and call you with the results.   Please make an appointment with [facility] ophthalmology for recheck of your eye background and to look for evidence of papilledema.   If you indeed have papilledema, we may have to consider another spinal tap for spinal fluid pressure reduction.  Call or email after your eye appointment to discuss.

## 2015-03-06 ENCOUNTER — Telehealth: Payer: Self-pay | Admitting: Neurology

## 2015-03-06 NOTE — Telephone Encounter (Signed)
Order for ONO placed during clinic visit on 03/02/15, pls advise [facility].

## 2015-03-06 NOTE — Telephone Encounter (Signed)
[phone], Mandi with [facility]  would like an order for over night pulse ox for cpap. They are going to try a new mask but pt is waking with headaches. If possible place order in epic.

## 2015-03-06 NOTE — Telephone Encounter (Signed)
I sent message through Epic to [facility] stating that there are orders for ONO.

## 2015-03-06 NOTE — Telephone Encounter (Signed)
Ok to order ONO through [facility]?

## 2015-03-07 ENCOUNTER — Encounter: Payer: Self-pay | Admitting: Family Medicine

## 2015-03-07 ENCOUNTER — Other Ambulatory Visit: Payer: Self-pay | Admitting: Family Medicine

## 2015-03-07 DIAGNOSIS — Z6841 Body Mass Index (BMI) 40.0 and over, adult: Principal | ICD-10-CM

## 2015-03-08 MED ORDER — CYCLOBENZAPRINE HCL 10 MG PO TABS: 10.0000 mg | ORAL_TABLET | Freq: Every day | ORAL | Status: DC

## 2015-03-08 MED ORDER — MELOXICAM 15 MG PO TABS: 15.0000 mg | ORAL_TABLET | Freq: Every day | ORAL | Status: DC

## 2015-03-08 NOTE — Telephone Encounter (Signed)
Ok to refill Mobic and Flexeril

## 2015-03-08 NOTE — Telephone Encounter (Signed)
Referral placed.

## 2015-03-08 NOTE — Telephone Encounter (Signed)
Medication filled to pharmacy as requested.   

## 2015-03-13 DIAGNOSIS — G4733 Obstructive sleep apnea (adult) (pediatric): Secondary | ICD-10-CM | POA: Diagnosis not present

## 2015-03-14 DIAGNOSIS — F251 Schizoaffective disorder, depressive type: Secondary | ICD-10-CM | POA: Diagnosis not present

## 2015-03-17 ENCOUNTER — Telehealth: Payer: Self-pay | Admitting: Neurology

## 2015-03-17 DIAGNOSIS — N921 Excessive and frequent menstruation with irregular cycle: Secondary | ICD-10-CM | POA: Diagnosis not present

## 2015-03-17 DIAGNOSIS — G932 Benign intracranial hypertension: Secondary | ICD-10-CM | POA: Diagnosis not present

## 2015-03-17 DIAGNOSIS — E559 Vitamin D deficiency, unspecified: Secondary | ICD-10-CM | POA: Diagnosis not present

## 2015-03-17 DIAGNOSIS — R7301 Impaired fasting glucose: Secondary | ICD-10-CM | POA: Diagnosis not present

## 2015-03-17 DIAGNOSIS — G4733 Obstructive sleep apnea (adult) (pediatric): Secondary | ICD-10-CM | POA: Diagnosis not present

## 2015-03-17 DIAGNOSIS — R5382 Chronic fatigue, unspecified: Secondary | ICD-10-CM | POA: Diagnosis not present

## 2015-03-17 DIAGNOSIS — N926 Irregular menstruation, unspecified: Secondary | ICD-10-CM | POA: Diagnosis not present

## 2015-03-17 NOTE — Telephone Encounter (Signed)
Please advise patient that I reviewed her recent (03/11/15) ONO while on CPAP, total test time was 4 h. Average O2 was 95.4%, nadir was 91%, therefore good results, maintaining O2 well while on CPAP, no need to worry about low O2. She put the probe on at 2 AM only? Please verify. At any rate, oxygen values are good. Keep doing what she is doing! FU as planned.

## 2015-03-21 NOTE — Telephone Encounter (Signed)
I really don't know, but I don't think so. She may want to see PCP and discuss seeing a cardiologist for further w/u. Pls relay to patient.

## 2015-03-21 NOTE — Telephone Encounter (Signed)
Patient reports that she woke up around 2-2:30 am feeling like her heart was beating hard. At that point she did take it off and go to restroom. When finished, she put it back on and went back to sleep. She is wondering if her waking up with heart "pounding" was due to low O2 levels?

## 2015-03-21 NOTE — Telephone Encounter (Signed)
I spoke to patient and she is aware of recommendation and states that she will contact PCP.

## 2015-03-23 NOTE — Telephone Encounter (Signed)
Error

## 2015-03-28 DIAGNOSIS — F251 Schizoaffective disorder, depressive type: Secondary | ICD-10-CM | POA: Diagnosis not present

## 2015-04-25 DIAGNOSIS — F251 Schizoaffective disorder, depressive type: Secondary | ICD-10-CM | POA: Diagnosis not present

## 2015-04-25 DIAGNOSIS — F840 Autistic disorder: Secondary | ICD-10-CM | POA: Diagnosis not present

## 2015-04-25 DIAGNOSIS — F4312 Post-traumatic stress disorder, chronic: Secondary | ICD-10-CM | POA: Diagnosis not present

## 2015-04-26 DIAGNOSIS — G4733 Obstructive sleep apnea (adult) (pediatric): Secondary | ICD-10-CM | POA: Diagnosis not present

## 2015-04-26 DIAGNOSIS — F419 Anxiety disorder, unspecified: Secondary | ICD-10-CM | POA: Diagnosis not present

## 2015-04-26 DIAGNOSIS — K900 Celiac disease: Secondary | ICD-10-CM | POA: Diagnosis not present

## 2015-04-26 DIAGNOSIS — E559 Vitamin D deficiency, unspecified: Secondary | ICD-10-CM | POA: Diagnosis not present

## 2015-04-26 DIAGNOSIS — G932 Benign intracranial hypertension: Secondary | ICD-10-CM | POA: Diagnosis not present

## 2015-04-26 DIAGNOSIS — F259 Schizoaffective disorder, unspecified: Secondary | ICD-10-CM | POA: Diagnosis not present

## 2015-04-26 DIAGNOSIS — E8881 Metabolic syndrome: Secondary | ICD-10-CM | POA: Diagnosis not present

## 2015-04-26 DIAGNOSIS — R7301 Impaired fasting glucose: Secondary | ICD-10-CM | POA: Diagnosis not present

## 2015-04-26 DIAGNOSIS — D539 Nutritional anemia, unspecified: Secondary | ICD-10-CM | POA: Diagnosis not present

## 2015-05-03 DIAGNOSIS — F419 Anxiety disorder, unspecified: Secondary | ICD-10-CM | POA: Diagnosis not present

## 2015-05-03 DIAGNOSIS — F259 Schizoaffective disorder, unspecified: Secondary | ICD-10-CM | POA: Diagnosis not present

## 2015-05-03 DIAGNOSIS — F54 Psychological and behavioral factors associated with disorders or diseases classified elsewhere: Secondary | ICD-10-CM | POA: Diagnosis not present

## 2015-05-05 DIAGNOSIS — F54 Psychological and behavioral factors associated with disorders or diseases classified elsewhere: Secondary | ICD-10-CM | POA: Diagnosis not present

## 2015-05-05 DIAGNOSIS — G4733 Obstructive sleep apnea (adult) (pediatric): Secondary | ICD-10-CM | POA: Diagnosis not present

## 2015-05-05 DIAGNOSIS — Z136 Encounter for screening for cardiovascular disorders: Secondary | ICD-10-CM | POA: Diagnosis not present

## 2015-05-05 DIAGNOSIS — R7301 Impaired fasting glucose: Secondary | ICD-10-CM | POA: Diagnosis not present

## 2015-05-30 ENCOUNTER — Encounter: Payer: Self-pay | Admitting: Family Medicine

## 2015-05-30 ENCOUNTER — Ambulatory Visit (INDEPENDENT_AMBULATORY_CARE_PROVIDER_SITE_OTHER): Payer: Medicare Other | Admitting: Family Medicine

## 2015-05-30 VITALS — BP 124/78 | HR 63 | Temp 98.2°F | Resp 16 | Ht 67.0 in | Wt 324.1 lb

## 2015-05-30 DIAGNOSIS — N926 Irregular menstruation, unspecified: Secondary | ICD-10-CM

## 2015-05-30 DIAGNOSIS — Z6841 Body Mass Index (BMI) 40.0 and over, adult: Secondary | ICD-10-CM | POA: Diagnosis not present

## 2015-05-30 NOTE — Progress Notes (Signed)
Pre visit review using our clinic review tool, if applicable. No additional management support is needed unless otherwise documented below in the visit note. 

## 2015-05-30 NOTE — Assessment & Plan Note (Signed)
Ongoing issue for pt.  She is currently working w/ [employer] and plans on upcoming gastric bypass.  Offered my support of her decision.  Will follow along and assist as able.

## 2015-05-30 NOTE — Progress Notes (Signed)
   Subjective:    Patient ID: Michaela Norris, female    DOB: 25-Aug-1975, 40 y.o.   MRN: [id number]  HPI Obesity- chronic problem, pt has gained 5 more lbs since September.  Seeing Dr Valetta Close in [place] and plans to have surgery w/ Dr Volanda Napoleon, [place].  Pt has elected to go w/ Roux-en-Y bypass surgery and due to Medicare waiting periods is thinking April or May.  Pt having difficult time accepting that this is the last resort but 'i think i got my head around it'.    Irregular menses- Pt has had very irregular cycles- 'always'.  Will have absence of periods for months to year at a time and then bleed for 6 months.   Review of Systems For ROS see HPI     Objective:   Physical Exam  Constitutional: She is oriented to person, place, and time. She appears well-developed and well-nourished. No distress.  Morbidly obese  HENT:  Head: Normocephalic and atraumatic.  Eyes: Conjunctivae and EOM are normal. Pupils are equal, round, and reactive to light.  Neurological: She is alert and oriented to person, place, and time.  Skin: Skin is warm and dry.  Psychiatric: She has a normal mood and affect. Her behavior is normal. Thought content normal.  Vitals reviewed.         Assessment & Plan:

## 2015-05-30 NOTE — Patient Instructions (Signed)
Follow up as needed Keep up the good work!  You've got this! We'll call you with your GYN appt Call with any questions or concerns Happy New Year!!!

## 2015-05-30 NOTE — Assessment & Plan Note (Signed)
New to provider, ongoing for pt.  Refer to GYN for complete evaluation/tx.

## 2015-06-04 ENCOUNTER — Emergency Department (HOSPITAL_COMMUNITY)
Admission: EM | Admit: 2015-06-04 | Discharge: 2015-06-04 | Disposition: A | Discharge status: Home / Self Care | Payer: Medicare Other | Attending: Emergency Medicine | Admitting: Emergency Medicine

## 2015-06-04 ENCOUNTER — Encounter (HOSPITAL_COMMUNITY): Payer: Self-pay | Admitting: Emergency Medicine

## 2015-06-04 DIAGNOSIS — Z3202 Encounter for pregnancy test, result negative: Secondary | ICD-10-CM | POA: Diagnosis not present

## 2015-06-04 DIAGNOSIS — Z91040 Latex allergy status: Secondary | ICD-10-CM | POA: Insufficient documentation

## 2015-06-04 DIAGNOSIS — I10 Essential (primary) hypertension: Secondary | ICD-10-CM | POA: Diagnosis not present

## 2015-06-04 DIAGNOSIS — Z791 Long term (current) use of non-steroidal anti-inflammatories (NSAID): Secondary | ICD-10-CM | POA: Insufficient documentation

## 2015-06-04 DIAGNOSIS — R197 Diarrhea, unspecified: Secondary | ICD-10-CM | POA: Diagnosis not present

## 2015-06-04 DIAGNOSIS — Z9981 Dependence on supplemental oxygen: Secondary | ICD-10-CM | POA: Diagnosis not present

## 2015-06-04 DIAGNOSIS — Z880 Allergy status to penicillin: Secondary | ICD-10-CM | POA: Diagnosis not present

## 2015-06-04 DIAGNOSIS — G4733 Obstructive sleep apnea (adult) (pediatric): Secondary | ICD-10-CM | POA: Diagnosis not present

## 2015-06-04 DIAGNOSIS — Z8659 Personal history of other mental and behavioral disorders: Secondary | ICD-10-CM | POA: Diagnosis not present

## 2015-06-04 DIAGNOSIS — Z7984 Long term (current) use of oral hypoglycemic drugs: Secondary | ICD-10-CM | POA: Diagnosis not present

## 2015-06-04 DIAGNOSIS — E860 Dehydration: Secondary | ICD-10-CM | POA: Diagnosis not present

## 2015-06-04 DIAGNOSIS — Z79899 Other long term (current) drug therapy: Secondary | ICD-10-CM | POA: Insufficient documentation

## 2015-06-04 LAB — COMPREHENSIVE METABOLIC PANEL
ALBUMIN: 3.6 g/dL (ref 3.5–5.0)
ALK PHOS: 85 U/L (ref 38–126)
ALT: 20 U/L (ref 14–54)
ANION GAP: 11 (ref 5–15)
AST: 21 U/L (ref 15–41)
BUN: 11 mg/dL (ref 6–20)
CHLORIDE: 104 mmol/L (ref 101–111)
CO2: 22 mmol/L (ref 22–32)
Calcium: 9 mg/dL (ref 8.9–10.3)
Creatinine, Ser: 0.65 mg/dL (ref 0.44–1.00)
GFR calc Af Amer: >60 mL/min (ref >60)
GFR calc non Af Amer: >60 mL/min (ref >60)
GLUCOSE: 131 mg/dL — AB (ref 65–99)
POTASSIUM: 4 mmol/L (ref 3.5–5.1)
SODIUM: 137 mmol/L (ref 135–145)
Total Bilirubin: 0.8 mg/dL (ref 0.3–1.2)
Total Protein: 7.1 g/dL (ref 6.5–8.1)

## 2015-06-04 LAB — I-STAT BETA HCG BLOOD, ED (MC, WL, AP ONLY): I-stat hCG, quantitative: <5 m[IU]/mL (ref <5)

## 2015-06-04 LAB — URINALYSIS, ROUTINE W REFLEX MICROSCOPIC
Bilirubin Urine: NEGATIVE
Glucose, UA: NEGATIVE mg/dL
Hgb urine dipstick: NEGATIVE
Ketones, ur: NEGATIVE mg/dL
LEUKOCYTES UA: NEGATIVE
NITRITE: NEGATIVE
Protein, ur: NEGATIVE mg/dL
SPECIFIC GRAVITY, URINE: 1.008 (ref 1.005–1.030)
pH: 5.5 (ref 5.0–8.0)

## 2015-06-04 LAB — CBC WITH DIFFERENTIAL/PLATELET
BASOS PCT: 0 %
Basophils Absolute: 0 10*3/uL (ref 0.0–0.1)
EOS ABS: 0 10*3/uL (ref 0.0–0.7)
Eosinophils Relative: 0 %
HCT: 38.6 % (ref 36.0–46.0)
HEMOGLOBIN: 12.2 g/dL (ref 12.0–15.0)
Lymphocytes Relative: 7 %
Lymphs Abs: 1.3 10*3/uL (ref 0.7–4.0)
MCH: 24 pg — ABNORMAL LOW (ref 26.0–34.0)
MCHC: 31.6 g/dL (ref 30.0–36.0)
MCV: 76 fL — ABNORMAL LOW (ref 78.0–100.0)
MONOS PCT: 5 %
Monocytes Absolute: 0.8 10*3/uL (ref 0.1–1.0)
NEUTROS PCT: 88 %
Neutro Abs: 15.6 10*3/uL — ABNORMAL HIGH (ref 1.7–7.7)
Platelets: 202 10*3/uL (ref 150–400)
RBC: 5.08 MIL/uL (ref 3.87–5.11)
RDW: 16.9 % — AB (ref 11.5–15.5)
WBC: 17.7 10*3/uL — ABNORMAL HIGH (ref 4.0–10.5)

## 2015-06-04 LAB — I-STAT CG4 LACTIC ACID, ED
LACTIC ACID, VENOUS: 3.8 mmol/L — AB (ref 0.5–2.0)
LACTIC ACID, VENOUS: 0.81 mmol/L (ref 0.5–2.0)

## 2015-06-04 LAB — POC OCCULT BLOOD, ED: FECAL OCCULT BLD: POSITIVE — AB

## 2015-06-04 MED ORDER — SODIUM CHLORIDE 0.9 % IV BOLUS (SEPSIS)
1000.0000 mL | Freq: Once | INTRAVENOUS | Status: AC
  Administered 2015-06-04: 1000 mL via INTRAVENOUS

## 2015-06-04 MED ORDER — MORPHINE SULFATE (PF) 2 MG/ML IV SOLN
2.0000 mg | Freq: Once | INTRAVENOUS | Status: AC
  Administered 2015-06-04: 2 mg via INTRAVENOUS

## 2015-06-04 MED ORDER — PROMETHAZINE HCL 25 MG/ML IJ SOLN
25.0000 mg | Freq: Once | INTRAMUSCULAR | Status: AC
  Administered 2015-06-04: 25 mg via INTRAVENOUS
  Filled 2015-06-04: qty 1

## 2015-06-04 MED ORDER — MORPHINE SULFATE (PF) 2 MG/ML IV SOLN
2.0000 mg | Freq: Once | INTRAVENOUS | Status: DC
  Filled 2015-06-04: qty 1

## 2015-06-04 NOTE — ED Notes (Addendum)
Upon assessment before in-and-out catheterization pt. Had heavy vaginal bleeding. Pt. Reports period has never been regular and she always has a heavy flow. Pt. Occult Results may be affected by this.

## 2015-06-04 NOTE — ED Notes (Signed)
0500 had abdominal cramping and diarrhea; has had diarrhea 6 times. States the abdominal cramping hurts like being in labor; pain located in lower abdomen.

## 2015-06-04 NOTE — Discharge Instructions (Signed)
Diarrhea  Diarrhea is watery poop (stool). It can make you feel weak, tired, thirsty, or give you a dry mouth (signs of dehydration). Watery poop is a sign of another problem, most often an infection. It often lasts 2-3 days. It can last longer if it is a sign of something serious. Take care of yourself as told by your doctor.  HOME CARE   · Drink 1 cup (8 ounces) of fluid each time you have watery poop.  · Do not drink the following fluids:    Those that contain simple sugars (fructose, glucose, galactose, lactose, sucrose, maltose).    Sports drinks.    Fruit juices.    Whole milk products.    Sodas.    Drinks with caffeine (coffee, tea, soda) or alcohol.  · Oral rehydration solution may be used if the doctor says it is okay. You may make your own solution. Follow this recipe:    ?-? teaspoon table salt.    ¾ teaspoon baking soda.    ? teaspoon salt substitute containing potassium chloride.    1 ? tablespoons sugar.    1 liter (34 ounces) of water.  · Avoid the following foods:    High fiber foods, such as raw fruits and vegetables.    Nuts, seeds, and whole grain breads and cereals.     Those that are sweetened with sugar alcohols (xylitol, sorbitol, mannitol).  · Try eating the following foods:    Starchy foods, such as rice, toast, pasta, low-sugar cereal, oatmeal, baked potatoes, crackers, and bagels.    Bananas.    Applesauce.  · Eat probiotic-rich foods, such as yogurt and milk products that are fermented.  · Wash your hands well after each time you have watery poop.  · Only take medicine as told by your doctor.  · Take a warm bath to help lessen burning or pain from having watery poop.  GET HELP RIGHT AWAY IF:   · You cannot drink fluids without throwing up (vomiting).  · You keep throwing up.  · You have blood in your poop, or your poop looks black and tarry.  · You do not pee (urinate) in 6-8 hours, or there is only a small amount of very dark pee.  · You have belly (abdominal) pain that gets worse or  stays in the same spot (localizes).  · You are weak, dizzy, confused, or light-headed.  · You have a very bad headache.  · Your watery poop gets worse or does not get better.  · You have a fever or lasting symptoms for more than 2-3 days.  · You have a fever and your symptoms suddenly get worse.  MAKE SURE YOU:   · Understand these instructions.  · Will watch your condition.  · Will get help right away if you are not doing well or get worse.     This information is not intended to replace advice given to you by your health care provider. Make sure you discuss any questions you have with your health care provider.     Document Released: 10/30/2007 Document Revised: 06/03/2014 Document Reviewed: 01/19/2012  Elsevier Interactive Patient Education ©2016 [employer]

## 2015-06-04 NOTE — ED Provider Notes (Signed)
CSN: [id number]     Arrival date & time 06/04/15  [id number] History   First MD Initiated Contact with Patient 06/04/15 [phone]     Chief Complaint  Patient presents with  . Diarrhea     (Consider location/radiation/quality/duration/timing/severity/associated sxs/prior Treatment) HPI   40 year old female presents today complaining of 5 episodes of loose bowel movements that occurred from 5 AM to 7 AM. She describes crampy lower abdominal pain. She denies any bright red blood per rectum. She states she has had similar episodes in the past. She had an episode very [place] this in May and was brought into the ED. She had elevated white blood cell count. She states that she got better and her primary care physician followed up in her white blood cell count normalized. She denies any fever, URI symptoms, nausea, or vomiting. She states her menstrual cycle is irregular. She states that after this occurred she went in to get her daughter up and she became lightheaded and very weak. She came to the emergency department secondary to this.  Past Medical History  Diagnosis Date  . Anxiety   . Depression   . Headache(784.0)   . Psychosis   . Schizoaffective disorder   . Mania ([place])   . Obesity   . PTSD (post-traumatic stress disorder)   . High blood pressure     off meds at this time  . Diabetes mellitus     notes borderline  . Hyperlipidemia   . OSA on CPAP 03/18/2013   Past Surgical History  Procedure Laterality Date  . Fracture surgery    . Cholecystectomy    . Broken ankle  2000   Family History  Problem Relation Age of Onset  . Alcohol abuse Mother   . Depression Mother   . Alcohol abuse Brother   . Suicidality Maternal Grandmother   . Anorexia nervosa Maternal Grandmother   . Asperger's syndrome Daughter   . Schizophrenia Father   . Brain cancer Father   . Early death Father    Social History  Substance Use Topics  . Smoking status: Never Smoker   . Smokeless tobacco: Never Used   . Alcohol Use: No   OB History    No data available     Review of Systems  All other systems reviewed and are negative.     Allergies  Latex; Penicillins; Tegretol; and Vicodin  Home Medications   Prior to Admission medications   Medication Sig Start Date End Date Taking? Authorizing Provider  ergocalciferol (VITAMIN D2) 50000 units capsule Take 50,000 Units by mouth once a week.   Yes Historical Provider, MD  metFORMIN (GLUCOPHAGE XR) 500 MG 24 hr tablet Take 500 mg by mouth. 05/05/15 05/04/16 Yes Historical Provider, MD  Cholecalciferol (VITAMIN D) 2000 units tablet Take 2,000 Units by mouth daily.    Historical Provider, MD  cyclobenzaprine (FLEXERIL) 10 MG tablet Take 1 tablet (10 mg total) by mouth at bedtime. Patient taking differently: Take 10 mg by mouth at bedtime as needed for muscle spasms.  03/08/15   Midge Minium, MD  meclizine (ANTIVERT) 50 MG tablet Take 1 tablet (50 mg total) by mouth 3 (three) times daily as needed. Patient taking differently: Take 50 mg by mouth 3 (three) times daily as needed for dizziness.  03/08/13   Midge Minium, MD  meloxicam (MOBIC) 15 MG tablet Take 1 tablet (15 mg total) by mouth daily. Patient taking differently: Take 15 mg by mouth daily as needed for  pain.  03/08/15   Midge Minium, MD   BP 133/71 mmHg  Pulse 86  Temp(Src) 97.8 F (36.6 C) (Oral)  Resp 23  Ht 5\' 8"  (1.727 m)  Wt 145.151 kg  BMI 48.67 kg/m2  SpO2 92%  LMP 06/04/2015 (Approximate) Physical Exam  Constitutional: She is oriented to person, place, and time. She appears well-developed and well-nourished.  Morbidly obese  HENT:  Head: Normocephalic and atraumatic.  Right Ear: External ear normal.  Left Ear: External ear normal.  Nose: Nose normal.  Mouth/Throat: Oropharynx is clear and moist.  Eyes: Conjunctivae and EOM are normal. Pupils are equal, round, and reactive to light.  Neck: Normal range of motion. Neck supple. No JVD present. No  tracheal deviation present. No thyromegaly present.  Cardiovascular: Normal rate, regular rhythm, normal heart sounds and intact distal pulses.   Pulmonary/Chest: Effort normal and breath sounds normal. She has no wheezes.  Abdominal: Soft. Bowel sounds are normal. She exhibits no mass. There is no tenderness. There is no guarding.  Musculoskeletal: Normal range of motion.  Lymphadenopathy:    She has no cervical adenopathy.  Neurological: She is alert and oriented to person, place, and time. She has normal reflexes. No cranial nerve deficit or sensory deficit. Gait normal. GCS eye subscore is 4. GCS verbal subscore is 5. GCS motor subscore is 6.  Reflex Scores:      Bicep reflexes are 2+ on the right side and 2+ on the left side.      Patellar reflexes are 2+ on the right side and 2+ on the left side. Strength is normal and equal throughout. Cranial nerves grossly intact. Patient fluent. No gross ataxia and patient able to ambulate without difficulty.  Skin: Skin is warm and dry.  Psychiatric: She has a normal mood and affect. Her behavior is normal. Judgment and thought content normal.  Nursing note and vitals reviewed.   ED Course  Procedures (including critical care time) Labs Review Labs Reviewed  CBC WITH DIFFERENTIAL/PLATELET - Abnormal; Notable for the following:    WBC 17.7 (*)    MCV 76.0 (*)    MCH 24.0 (*)    RDW 16.9 (*)    Neutro Abs 15.6 (*)    All other components within normal limits  COMPREHENSIVE METABOLIC PANEL - Abnormal; Notable for the following:    Glucose, Bld 131 (*)    All other components within normal limits  POC OCCULT BLOOD, ED - Abnormal; Notable for the following:    Fecal Occult Bld POSITIVE (*)    All other components within normal limits  I-STAT CG4 LACTIC ACID, ED - Abnormal; Notable for the following:    Lactic Acid, Venous 3.80 (*)    All other components within normal limits  URINALYSIS, ROUTINE W REFLEX MICROSCOPIC (NOT AT [facility])   I-STAT BETA HCG BLOOD, ED ([facility], [facility], [facility] ONLY)  I-STAT CG4 LACTIC ACID, ED    Imaging Review No results found. I have personally reviewed and evaluated these images and lab results as part of my medical decision-making.   EKG Interpretation   Date/Time:  Sunday June 04 2015 08:59:07 EST Ventricular Rate:  80 PR Interval:  161 QRS Duration: 115 QT Interval:  369 QTC Calculation: 426 R Axis:   29 Text Interpretation:  Normal sinus rhythm RSR' or QR pattern in V1  suggests right ventricular conduction delay Confirmed by Ilai Hiller MD, Andee Poles  [id number]) on 06/04/2015 9:04:15 AM      MDM   Final  diagnoses:  Diarrhea, unspecified type    Patient began having her menstrual cycle while here. The Hemoccult was obtained after she began menstruating. Her hemoglobin is normal. She had lactic acid done that is elevated at 3.8 She is having a recheck done. She has been asymptomatic here.Recheck troponin .81.  Patient continues to be stable without diarrhea- hr, bp normal.   Pattricia Boss, MD 06/04/15 1540

## 2015-06-07 DIAGNOSIS — F419 Anxiety disorder, unspecified: Secondary | ICD-10-CM | POA: Diagnosis not present

## 2015-06-07 DIAGNOSIS — F259 Schizoaffective disorder, unspecified: Secondary | ICD-10-CM | POA: Diagnosis not present

## 2015-06-07 DIAGNOSIS — F54 Psychological and behavioral factors associated with disorders or diseases classified elsewhere: Secondary | ICD-10-CM | POA: Diagnosis not present

## 2015-06-08 DIAGNOSIS — R700 Elevated erythrocyte sedimentation rate: Secondary | ICD-10-CM | POA: Diagnosis not present

## 2015-06-08 DIAGNOSIS — D72829 Elevated white blood cell count, unspecified: Secondary | ICD-10-CM | POA: Diagnosis not present

## 2015-06-08 DIAGNOSIS — R55 Syncope and collapse: Secondary | ICD-10-CM | POA: Diagnosis not present

## 2015-06-13 DIAGNOSIS — F840 Autistic disorder: Secondary | ICD-10-CM | POA: Diagnosis not present

## 2015-06-13 DIAGNOSIS — F4312 Post-traumatic stress disorder, chronic: Secondary | ICD-10-CM | POA: Diagnosis not present

## 2015-06-13 DIAGNOSIS — F333 Major depressive disorder, recurrent, severe with psychotic symptoms: Secondary | ICD-10-CM | POA: Diagnosis not present

## 2015-06-14 DIAGNOSIS — K921 Melena: Secondary | ICD-10-CM | POA: Diagnosis not present

## 2015-06-14 DIAGNOSIS — R197 Diarrhea, unspecified: Secondary | ICD-10-CM | POA: Diagnosis not present

## 2015-06-14 DIAGNOSIS — R112 Nausea with vomiting, unspecified: Secondary | ICD-10-CM | POA: Diagnosis not present

## 2015-06-14 DIAGNOSIS — K219 Gastro-esophageal reflux disease without esophagitis: Secondary | ICD-10-CM | POA: Diagnosis not present

## 2015-06-15 DIAGNOSIS — N926 Irregular menstruation, unspecified: Secondary | ICD-10-CM | POA: Diagnosis not present

## 2015-06-15 DIAGNOSIS — R87615 Unsatisfactory cytologic smear of cervix: Secondary | ICD-10-CM | POA: Diagnosis not present

## 2015-06-15 DIAGNOSIS — Z01419 Encounter for gynecological examination (general) (routine) without abnormal findings: Secondary | ICD-10-CM | POA: Diagnosis not present

## 2015-06-15 DIAGNOSIS — Z124 Encounter for screening for malignant neoplasm of cervix: Secondary | ICD-10-CM | POA: Diagnosis not present

## 2015-06-15 DIAGNOSIS — N913 Primary oligomenorrhea: Secondary | ICD-10-CM | POA: Diagnosis not present

## 2015-06-19 DIAGNOSIS — F333 Major depressive disorder, recurrent, severe with psychotic symptoms: Secondary | ICD-10-CM | POA: Diagnosis not present

## 2015-06-19 DIAGNOSIS — F4312 Post-traumatic stress disorder, chronic: Secondary | ICD-10-CM | POA: Diagnosis not present

## 2015-06-19 DIAGNOSIS — F840 Autistic disorder: Secondary | ICD-10-CM | POA: Diagnosis not present

## 2015-06-28 DIAGNOSIS — E282 Polycystic ovarian syndrome: Secondary | ICD-10-CM | POA: Diagnosis not present

## 2015-06-28 DIAGNOSIS — N926 Irregular menstruation, unspecified: Secondary | ICD-10-CM | POA: Diagnosis not present

## 2015-06-28 DIAGNOSIS — R87615 Unsatisfactory cytologic smear of cervix: Secondary | ICD-10-CM | POA: Diagnosis not present

## 2015-07-03 DIAGNOSIS — I10 Essential (primary) hypertension: Secondary | ICD-10-CM | POA: Diagnosis not present

## 2015-07-03 DIAGNOSIS — R197 Diarrhea, unspecified: Secondary | ICD-10-CM | POA: Diagnosis not present

## 2015-07-03 DIAGNOSIS — K635 Polyp of colon: Secondary | ICD-10-CM | POA: Diagnosis not present

## 2015-07-03 DIAGNOSIS — K648 Other hemorrhoids: Secondary | ICD-10-CM | POA: Diagnosis not present

## 2015-07-03 DIAGNOSIS — E785 Hyperlipidemia, unspecified: Secondary | ICD-10-CM | POA: Diagnosis not present

## 2015-07-03 DIAGNOSIS — F329 Major depressive disorder, single episode, unspecified: Secondary | ICD-10-CM | POA: Diagnosis not present

## 2015-07-03 DIAGNOSIS — F259 Schizoaffective disorder, unspecified: Secondary | ICD-10-CM | POA: Diagnosis not present

## 2015-07-03 DIAGNOSIS — K440 Diaphragmatic hernia with obstruction, without gangrene: Secondary | ICD-10-CM | POA: Diagnosis not present

## 2015-07-03 DIAGNOSIS — D125 Benign neoplasm of sigmoid colon: Secondary | ICD-10-CM | POA: Diagnosis not present

## 2015-07-03 DIAGNOSIS — K921 Melena: Secondary | ICD-10-CM | POA: Diagnosis not present

## 2015-07-03 DIAGNOSIS — K449 Diaphragmatic hernia without obstruction or gangrene: Secondary | ICD-10-CM | POA: Diagnosis not present

## 2015-07-03 DIAGNOSIS — R112 Nausea with vomiting, unspecified: Secondary | ICD-10-CM | POA: Diagnosis not present

## 2015-07-03 DIAGNOSIS — Z6841 Body Mass Index (BMI) 40.0 and over, adult: Secondary | ICD-10-CM | POA: Diagnosis not present

## 2015-07-03 DIAGNOSIS — G4733 Obstructive sleep apnea (adult) (pediatric): Secondary | ICD-10-CM | POA: Diagnosis not present

## 2015-07-03 DIAGNOSIS — K219 Gastro-esophageal reflux disease without esophagitis: Secondary | ICD-10-CM | POA: Diagnosis not present

## 2015-07-03 DIAGNOSIS — E559 Vitamin D deficiency, unspecified: Secondary | ICD-10-CM | POA: Diagnosis not present

## 2015-07-03 DIAGNOSIS — R109 Unspecified abdominal pain: Secondary | ICD-10-CM | POA: Diagnosis not present

## 2015-07-04 DIAGNOSIS — F4312 Post-traumatic stress disorder, chronic: Secondary | ICD-10-CM | POA: Diagnosis not present

## 2015-07-04 DIAGNOSIS — F840 Autistic disorder: Secondary | ICD-10-CM | POA: Diagnosis not present

## 2015-07-04 DIAGNOSIS — F333 Major depressive disorder, recurrent, severe with psychotic symptoms: Secondary | ICD-10-CM | POA: Diagnosis not present

## 2015-07-10 DIAGNOSIS — F4312 Post-traumatic stress disorder, chronic: Secondary | ICD-10-CM | POA: Diagnosis not present

## 2015-07-10 DIAGNOSIS — F840 Autistic disorder: Secondary | ICD-10-CM | POA: Diagnosis not present

## 2015-07-10 DIAGNOSIS — F333 Major depressive disorder, recurrent, severe with psychotic symptoms: Secondary | ICD-10-CM | POA: Diagnosis not present

## 2015-07-12 DIAGNOSIS — N926 Irregular menstruation, unspecified: Secondary | ICD-10-CM | POA: Diagnosis not present

## 2015-07-13 DIAGNOSIS — D1330 Benign neoplasm of unspecified part of small intestine: Secondary | ICD-10-CM | POA: Diagnosis not present

## 2015-07-13 DIAGNOSIS — Z9049 Acquired absence of other specified parts of digestive tract: Secondary | ICD-10-CM | POA: Diagnosis not present

## 2015-07-13 DIAGNOSIS — K635 Polyp of colon: Secondary | ICD-10-CM | POA: Diagnosis not present

## 2015-07-17 DIAGNOSIS — F333 Major depressive disorder, recurrent, severe with psychotic symptoms: Secondary | ICD-10-CM | POA: Diagnosis not present

## 2015-07-17 DIAGNOSIS — F840 Autistic disorder: Secondary | ICD-10-CM | POA: Diagnosis not present

## 2015-07-17 DIAGNOSIS — F4312 Post-traumatic stress disorder, chronic: Secondary | ICD-10-CM | POA: Diagnosis not present

## 2015-07-19 DIAGNOSIS — D509 Iron deficiency anemia, unspecified: Secondary | ICD-10-CM | POA: Diagnosis not present

## 2015-07-19 DIAGNOSIS — R197 Diarrhea, unspecified: Secondary | ICD-10-CM | POA: Diagnosis not present

## 2015-07-24 DIAGNOSIS — F840 Autistic disorder: Secondary | ICD-10-CM | POA: Diagnosis not present

## 2015-07-24 DIAGNOSIS — F4312 Post-traumatic stress disorder, chronic: Secondary | ICD-10-CM | POA: Diagnosis not present

## 2015-07-24 DIAGNOSIS — F333 Major depressive disorder, recurrent, severe with psychotic symptoms: Secondary | ICD-10-CM | POA: Diagnosis not present

## 2015-07-26 DIAGNOSIS — F419 Anxiety disorder, unspecified: Secondary | ICD-10-CM | POA: Diagnosis not present

## 2015-07-26 DIAGNOSIS — F259 Schizoaffective disorder, unspecified: Secondary | ICD-10-CM | POA: Diagnosis not present

## 2015-07-26 DIAGNOSIS — F54 Psychological and behavioral factors associated with disorders or diseases classified elsewhere: Secondary | ICD-10-CM | POA: Diagnosis not present

## 2015-07-31 DIAGNOSIS — F840 Autistic disorder: Secondary | ICD-10-CM | POA: Diagnosis not present

## 2015-07-31 DIAGNOSIS — F4312 Post-traumatic stress disorder, chronic: Secondary | ICD-10-CM | POA: Diagnosis not present

## 2015-07-31 DIAGNOSIS — F333 Major depressive disorder, recurrent, severe with psychotic symptoms: Secondary | ICD-10-CM | POA: Diagnosis not present

## 2015-08-07 DIAGNOSIS — F4312 Post-traumatic stress disorder, chronic: Secondary | ICD-10-CM | POA: Diagnosis not present

## 2015-08-07 DIAGNOSIS — F840 Autistic disorder: Secondary | ICD-10-CM | POA: Diagnosis not present

## 2015-08-07 DIAGNOSIS — F333 Major depressive disorder, recurrent, severe with psychotic symptoms: Secondary | ICD-10-CM | POA: Diagnosis not present

## 2015-08-08 DIAGNOSIS — F4312 Post-traumatic stress disorder, chronic: Secondary | ICD-10-CM | POA: Diagnosis not present

## 2015-08-08 DIAGNOSIS — F840 Autistic disorder: Secondary | ICD-10-CM | POA: Diagnosis not present

## 2015-08-08 DIAGNOSIS — F333 Major depressive disorder, recurrent, severe with psychotic symptoms: Secondary | ICD-10-CM | POA: Diagnosis not present

## 2015-08-17 DIAGNOSIS — F4312 Post-traumatic stress disorder, chronic: Secondary | ICD-10-CM | POA: Diagnosis not present

## 2015-08-17 DIAGNOSIS — F333 Major depressive disorder, recurrent, severe with psychotic symptoms: Secondary | ICD-10-CM | POA: Diagnosis not present

## 2015-08-22 DIAGNOSIS — F333 Major depressive disorder, recurrent, severe with psychotic symptoms: Secondary | ICD-10-CM | POA: Diagnosis not present

## 2015-08-22 DIAGNOSIS — F4312 Post-traumatic stress disorder, chronic: Secondary | ICD-10-CM | POA: Diagnosis not present

## 2015-08-23 DIAGNOSIS — Z6841 Body Mass Index (BMI) 40.0 and over, adult: Secondary | ICD-10-CM | POA: Diagnosis not present

## 2015-08-25 DIAGNOSIS — B373 Candidiasis of vulva and vagina: Secondary | ICD-10-CM | POA: Diagnosis not present

## 2015-08-30 DIAGNOSIS — F4312 Post-traumatic stress disorder, chronic: Secondary | ICD-10-CM | POA: Diagnosis not present

## 2015-08-30 DIAGNOSIS — F333 Major depressive disorder, recurrent, severe with psychotic symptoms: Secondary | ICD-10-CM | POA: Diagnosis not present

## 2015-08-31 ENCOUNTER — Ambulatory Visit: Payer: Medicare Other | Admitting: Neurology

## 2015-09-05 DIAGNOSIS — F333 Major depressive disorder, recurrent, severe with psychotic symptoms: Secondary | ICD-10-CM | POA: Diagnosis not present

## 2015-09-05 DIAGNOSIS — F4312 Post-traumatic stress disorder, chronic: Secondary | ICD-10-CM | POA: Diagnosis not present

## 2015-09-18 DIAGNOSIS — B373 Candidiasis of vulva and vagina: Secondary | ICD-10-CM | POA: Diagnosis not present

## 2015-09-19 DIAGNOSIS — F4312 Post-traumatic stress disorder, chronic: Secondary | ICD-10-CM | POA: Diagnosis not present

## 2015-09-19 DIAGNOSIS — F333 Major depressive disorder, recurrent, severe with psychotic symptoms: Secondary | ICD-10-CM | POA: Diagnosis not present

## 2015-09-20 DIAGNOSIS — E559 Vitamin D deficiency, unspecified: Secondary | ICD-10-CM | POA: Diagnosis not present

## 2015-09-20 DIAGNOSIS — F419 Anxiety disorder, unspecified: Secondary | ICD-10-CM | POA: Diagnosis not present

## 2015-09-20 DIAGNOSIS — E8881 Metabolic syndrome: Secondary | ICD-10-CM | POA: Diagnosis not present

## 2015-09-20 DIAGNOSIS — G4733 Obstructive sleep apnea (adult) (pediatric): Secondary | ICD-10-CM | POA: Diagnosis not present

## 2015-09-20 DIAGNOSIS — F259 Schizoaffective disorder, unspecified: Secondary | ICD-10-CM | POA: Diagnosis not present

## 2015-09-20 DIAGNOSIS — Z9989 Dependence on other enabling machines and devices: Secondary | ICD-10-CM | POA: Diagnosis not present

## 2015-09-20 DIAGNOSIS — G932 Benign intracranial hypertension: Secondary | ICD-10-CM | POA: Diagnosis not present

## 2015-09-20 DIAGNOSIS — K900 Celiac disease: Secondary | ICD-10-CM | POA: Diagnosis not present

## 2015-09-26 DIAGNOSIS — F4312 Post-traumatic stress disorder, chronic: Secondary | ICD-10-CM | POA: Diagnosis not present

## 2015-09-26 DIAGNOSIS — F333 Major depressive disorder, recurrent, severe with psychotic symptoms: Secondary | ICD-10-CM | POA: Diagnosis not present

## 2015-09-27 ENCOUNTER — Ambulatory Visit (INDEPENDENT_AMBULATORY_CARE_PROVIDER_SITE_OTHER): Payer: Medicare Other | Admitting: Adult Health

## 2015-09-27 ENCOUNTER — Encounter: Payer: Self-pay | Admitting: Adult Health

## 2015-09-27 VITALS — BP 119/67 | HR 61 | Ht 68.0 in | Wt 287.0 lb

## 2015-09-27 DIAGNOSIS — G932 Benign intracranial hypertension: Secondary | ICD-10-CM

## 2015-09-27 DIAGNOSIS — G4733 Obstructive sleep apnea (adult) (pediatric): Secondary | ICD-10-CM | POA: Diagnosis not present

## 2015-09-27 DIAGNOSIS — Z9989 Dependence on other enabling machines and devices: Principal | ICD-10-CM

## 2015-09-27 NOTE — Progress Notes (Addendum)
  PATIENT: Michaela Norris DOB: 06/22/1975  REASON FOR VISIT: follow up- pseudotumor cerebri, obstructive sleep apnea on CPAP HISTORY FROM: patient  HISTORY OF PRESENT ILLNESS: Michaela Norris is a 40-year-old female with a history of obstructive sleep apnea on CPAP as well as pseudotumor cerebri. Michaela Norris returns today for follow-up. The patient states that Michaela Norris will undergo bariatric surgery in 2 weeks. Michaela Norris brought Michaela Norris CPAP today for a download. Michaela Norris download indicates that Michaela Norris uses Michaela Norris machine 27 out of 30 days for compliance of 87%. Michaela Norris uses Michaela Norris machine greater than 4 hours 25 out of 30 days for compliance of 83%. On average Michaela Norris uses Michaela Norris machine 6 hours and 32 minutes. Michaela Norris residual AHI is 0.1 on 11 cm of water with EPR of 3. The patient does not have a significant leak. Michaela Norris reports that Michaela Norris's been having a hard time using the machine. Michaela Norris states that Michaela Norris mouth gets really dry and Michaela Norris developed sores in the back of the mouth. Michaela Norris also feels that the pressure is very strong. Michaela Norris has turned up herhumidification but does not feel that it offers Michaela Norris any benefit. The patient reports that Michaela Norris is not having any headaches. Michaela Norris will occasionally have a "whooshing" sound in Michaela Norris ears as well as some photophobia. Michaela Norris has not followed up with Michaela Norris ophthalmologist. Michaela Norris returns today for an evaluation.  HISTORY (ATHAR) : Michaela Norris is a 40-year-old right-handed woman with an underlying medical history of morbid obesity, Asperger's syndrome, schizo-affective d/o, vertigo, and low back pain, who presents for followup consultation of Michaela Norris recurrent headaches in the contex to pseudotumor cerebri as well as Michaela Norris OSA. Michaela Norris is unaccompanied today. Of note, Michaela Norris no showed for appointment on 01/19/2014 and canceled an appointment for 02/15/2015. I last saw Michaela Norris on 08/25/2013, at which time Michaela Norris reported ongoing paresthesias, secondary to the Diamox affecting Michaela Norris hands and face and perioral area. Michaela Norris was taking a break from school. Michaela Norris was  willing to try topamax, but worried about side effects. Michaela Norris is reported compliance with Michaela Norris CPAP use, switching between nasal pillows (caused nasal and sinus congestion) and a FFM (caused severe mouth dryness). Michaela Norris had trouble falling asleep. Michaela Norris reported recently being treated with antibiotics and steroids for upper respiratory infection. Since Michaela Norris was having side effects with Diamox I suggested Michaela Norris taper off of it then gradually start Topamax. Today, 03/02/2015: I reviewed Michaela Norris CPAP compliance data from 01/30/15 through 02/28/15, which is a total of 30 days, during which time Michaela Norris used Michaela Norris CPAP every night, with % used days greater than 4 hours of 100%, indicating superp compliance, with an average usage of 8 h and 23 minutes, residual AHI low at 0.4/h, leak low, with the 95th percentile at 3.4 L/m on a pressure of 11 cm with EPR of 3. Today, 03/02/2015: Michaela Norris reports that Michaela Norris had to go to the ER in May 2016 for food poisening. Michaela Norris O2 monitor showed desaturations. Michaela Norris has had occasional morning headaches. Michaela Norris was in [place] for about a year with Michaela Norris daughter, who is now back at [employer]. Michaela Norris has not seen Michaela Norris ophthalmologist in some time, maybe 2 years. Michaela Norris has very mild blurry vision and some trouble seeing at night.   Previously:  I reviewed Michaela Norris compliance data from 12/19/2013 through 01/17/2014 which is total of 30 days during which time Michaela Norris used Michaela Norris CPAP every night, percent days greater than 4 hours was 97%, indicating excellent compliance, average usage was 7 hours and 32 minutes, residual AHI   very low at 0.4 per hour, leak low at 9 L per minute at the 95th percentile, current pressure at 11 cm with EPR of 3.  I reviewed the patient's CPAP compliance data from 07/27/2013 to 08/25/2013, which is a total of 30 days, during which time the patient used CPAP every day except for 1 day. The average usage for all days was 8 hours and 3 minutes. The percent used days greater than 4 hours was 97 %, indicating excellent  compliance. The residual AHI was 0.4 per hour, indicating an appropriate treatment pressure of 11 cwp with EPR of 3. I saw Michaela Norris on 04/16/2013, at which time I talked to Michaela Norris about Michaela Norris Michaela Norris CPAP compliance and encouraged Michaela Norris to use CPAP every night. I encouraged Michaela Norris to continue Diamox and seek followup with ophthalmology. Diamox was causing Michaela Norris paresthesias and we talked about switching Michaela Norris to Topamax in the future but Michaela Norris was reluctant to try it as Michaela Norris was afraid of cognitive side effects. I encouraged Michaela Norris to lose weight.  I reviewed the patient's CPAP compliance data from 05/11/2013 through 06/09/2013 which is a total of 30 days, during which time the patient used CPAP every day. The average usage for all days was 8 hours and 46 minutes. The percent used days greater than 4 hours was 100 %, indicating excellent compliance. The residual AHI was 0.6 per hour, indicating an appropriate treatment pressure of 11 cwp with EPR of 3.  I saw Michaela Norris on 03/18/2013 at which time restarted Michaela Norris on Diamox for pseudotumor cerebri. I also went over diagnostic test results including the lumbar puncture results, the sleep study results, and also discussed how Michaela Norris had been doing on CPAP. Michaela Norris indicated full compliance with CPAP and good results with using CPAP and felt, Michaela Norris slept better with less EDS. Unfortunately, at the time of Michaela Norris visit in 9/14 I did not have the requested compliance download data available which was supposed to be faxed to us by Michaela Norris DME company, [employer].  Michaela Norris has had night time panic attacks before treatment with CPAP and those improved.  Michaela Norris has had more allergy Sx and Michaela Norris diamox caused Michaela Norris numbness in tingling in Michaela Norris face, hands and feet. Michaela Norris had no blurry vision.  I reviewed Michaela Norris compliance data from 02/21/2013 through 03/22/2013 (30 days), during which time Michaela Norris was fully compliant, using CPAP every day. Michaela Norris percent used days greater than 4 hours was 97%, indicating excellent compliance. Michaela Norris average usage for  all days was 9 hours and 5 minutes. Michaela Norris residual AHI was low at 1.3 per hour, indicating an appropriate treatment pressure of 11 cm with EPR of 3. Michaela Norris leak was also not very high.  I first met Michaela Norris on 12/16/2012, at which time I suggested an ophthalmology referral, and a lumbar puncture under fluoroscopic guidance as well as a sleep study based on concern for obstructive sleep apnea.  Michaela Norris had Michaela Norris LP on 03/09/2013: OP was elevated at 38.5 cm water. Following removal of 34 mL of clear CSF, the CP was 11.5 cm water. All lab results routinely done on the CSF were negative.  Michaela Norris had a split-night sleep study on 01/04/2013: baseline sleep efficiency was reduced at 55.4% with a latency to sleep of 17 minutes and wake after sleep onset of 45.5 minutes with moderate sleep fragmentation noted. Michaela Norris had an increased percentage of stage I and stage II sleep, a normal percentage of deep sleep, and absence of REM sleep prior to CPAP initiation. Michaela Norris   total AHI was 34.1 per hour. Michaela Norris baseline oxygen saturation was 92%, Michaela Norris nadir was 86%. Michaela Norris was then titrated on CPAP from 7 cm to 11 cm of water pressure and on the final pressure Michaela Norris had a residual AHI of 0 per hour, with EPR of 2 for comfort. Michaela Norris was noted to have periodic leg movements of sleep prior to CPAP at 12.4 per hour but no significant arousals from these. Based on the test results I ordered CPAP for Michaela Norris. Michaela Norris did complain of nasal stuffiness and pressure on Michaela Norris face and nose from the mask. Michaela Norris adjusted to it fairly well, could not tolerate the FFM and overall felt better.  Michaela Norris saw an ophthalmologist at [facility] ophthalmology, and Michaela Norris was told, there was no papilledema. Michaela Norris was told to come back in 2 years.  Michaela Norris had a bout of vertigo recently and Michaela Norris wondered if this is related to the CPAP. It improved after a few days and resolved after a week.  Michaela Norris was diagnosed with probable PTC perhaps 5 years ago, by an ophthalmologist at the time, who found papilledema. Michaela Norris  saw 2 other neurologists in our office in the past and was prescribed Diamox by Dr. Brett Fairy in our office and was last seen by Dr. Brett Fairy in 5/11, at which time Michaela Norris still had not had Michaela Norris LP due to not showing up for the tap and claiming Michaela Norris felt better. Michaela Norris had MRI/MRA/MRV in the past which were negative and Michaela Norris felt better after coming off of Lithium in 2012.  Michaela Norris had SE on Diamox d/t numbness in Michaela Norris hands and feet. Michaela Norris saw an ophthalmologist in 2013, who did not find evidence of Papilledema. Michaela Norris endorsed snoring and has had choking sensations or dreams of breathing pauses and often woke up in a panic and sweaty. Michaela Norris reported EDS before being treated with CPAP.   REVIEW OF SYSTEMS: Out of a complete 14 system review of symptoms, the patient complains only of the following symptoms, and all other reviewed systems are negative.  Neck pain, neck stiffness, back pain, frequent waking, light sensitivity  ALLERGIES: Allergies  Allergen Reactions  . Latex Itching and Rash  . Penicillins Swelling    Swelling in hands; states Michaela Norris still takes it as needed  . Tegretol [Carbamazepine] Rash  . Vicodin [Hydrocodone-Acetaminophen] Rash    HOME MEDICATIONS: Outpatient Prescriptions Prior to Visit  Medication Sig Dispense Refill  . Cholecalciferol (VITAMIN D) 2000 units tablet Take 2,000 Units by mouth daily.    . cyclobenzaprine (FLEXERIL) 10 MG tablet Take 1 tablet (10 mg total) by mouth at bedtime. (Patient taking differently: Take 10 mg by mouth at bedtime as needed for muscle spasms. ) 30 tablet 1  . meclizine (ANTIVERT) 50 MG tablet Take 1 tablet (50 mg total) by mouth 3 (three) times daily as needed. (Patient taking differently: Take 50 mg by mouth 3 (three) times daily as needed for dizziness. ) 60 tablet 0  . metFORMIN (GLUCOPHAGE XR) 500 MG 24 hr tablet Take 500 mg by mouth.    . ergocalciferol (VITAMIN D2) 50000 units capsule Take 50,000 Units by mouth once a week.    . meloxicam (MOBIC) 15  MG tablet Take 1 tablet (15 mg total) by mouth daily. (Patient not taking: Reported on 09/27/2015) 30 tablet 1   No facility-administered medications prior to visit.    PAST MEDICAL HISTORY: Past Medical History  Diagnosis Date  . Anxiety   . Depression   . Headache(784.0)   .  Psychosis   . Schizoaffective disorder   . Mania ([place])   . Obesity   . PTSD (post-traumatic stress disorder)   . High blood pressure     off meds at this time  . Diabetes mellitus     notes borderline  . Hyperlipidemia   . OSA on CPAP 03/18/2013    PAST SURGICAL HISTORY: Past Surgical History  Procedure Laterality Date  . Fracture surgery    . Cholecystectomy    . Broken ankle  2000    FAMILY HISTORY: Family History  Problem Relation Age of Onset  . Alcohol abuse Mother   . Depression Mother   . Alcohol abuse Brother   . Suicidality Maternal Grandmother   . Anorexia nervosa Maternal Grandmother   . Asperger's syndrome Daughter   . Schizophrenia Father   . Brain cancer Father   . Early death Father     SOCIAL HISTORY: Social History   Social History  . Marital Status: Single    Spouse Name: N/A  . Number of Children: N/A  . Years of Education: N/A   Occupational History  . Not on file.   Social History Main Topics  . Smoking status: Never Smoker   . Smokeless tobacco: Never Used  . Alcohol Use: No  . Drug Use: No  . Sexual Activity: Not Currently   Other Topics Concern  . Not on file   Social History Narrative      PHYSICAL EXAM  Filed Vitals:   09/27/15 0842  BP: 119/67  Pulse: 61  Height: 5' 8" (1.727 m)  Weight: 287 lb (130.182 kg)   Body mass index is 43.65 kg/(m^2).  Generalized: Well developed, in no acute distress  Neck: Circumference 15 3/4 inches, Mallampati 2+  Neurological examination  Mentation: Alert oriented to time, place, history taking. Follows all commands speech and language fluent Cranial nerve II-XII: Pupils were equal round reactive to  light. Extraocular movements were full, visual field were full on confrontational test. Facial sensation and strength were normal. Uvula tongue midline. Head turning and shoulder shrug  were normal and symmetric. Motor: The motor testing reveals 5 over 5 strength of all 4 extremities. Good symmetric motor tone is noted throughout.  Sensory: Sensory testing is intact to soft touch on all 4 extremities. No evidence of extinction is noted.  Coordination: Cerebellar testing reveals good finger-nose-finger and heel-to-shin bilaterally.  Gait and station: Gait is normal. .  Reflexes: Deep tendon reflexes are symmetric and normal bilaterally.   DIAGNOSTIC DATA (LABS, IMAGING, TESTING) - I reviewed patient records, labs, notes, testing and imaging myself where available.  Lab Results  Component Value Date   WBC 17.7* 06/04/2015   HGB 12.2 06/04/2015   HCT 38.6 06/04/2015   MCV 76.0* 06/04/2015   PLT 202 06/04/2015      Component Value Date/Time   NA 137 06/04/2015 0847   K 4.0 06/04/2015 0847   CL 104 06/04/2015 0847   CO2 22 06/04/2015 0847   GLUCOSE 131* 06/04/2015 0847   BUN 11 06/04/2015 0847   CREATININE 0.65 06/04/2015 0847   CALCIUM 9.0 06/04/2015 0847   PROT 7.1 06/04/2015 0847   ALBUMIN 3.6 06/04/2015 0847   AST 21 06/04/2015 0847   ALT 20 06/04/2015 0847   ALKPHOS 85 06/04/2015 0847   BILITOT 0.8 06/04/2015 0847   GFRNONAA >60 06/04/2015 0847   GFRAA >60 06/04/2015 0847    Lab Results  Component Value Date   HGBA1C 5.8 02/21/2015  Lab Results  Component Value Date   TSH 1.13 02/21/2015      ASSESSMENT AND PLAN 40 y.o. year old female  has a past medical history of Anxiety; Depression; Headache(784.0); Psychosis; Schizoaffective disorder; Mania ([place]); Obesity; PTSD (post-traumatic stress disorder); High blood pressure; Diabetes mellitus; Hyperlipidemia; and OSA on CPAP (03/18/2013). here with:  1. Obstructive sleep apnea on CPAP 2. Pseudotumor cerebri  The  patient download is excellent. Michaela Norris does report some difficulty with using the machine due to dry mouth and strong pressure. I will put the patient on AutoSet 5-20 cm of water to determine a good pressure for Michaela Norris. The patient is also instructed to follow with Michaela Norris DME have Michaela Norris humidification evaluated. Patient verbalized understanding. Michaela Norris should also follow-up with Michaela Norris ophthalmologist. Patient advised that if Michaela Norris symptoms worsen or Michaela Norris develops any new symptoms Michaela Norris should let us know. Michaela Norris will follow-up in 6 months or sooner if needed.    Ward Givens, MSN, NP-C 09/27/2015, 9:30 AM [facility] [address], [place], [place] [address] [phone]   I reviewed the above note and documentation by the Nurse Practitioner and agree with the history, physical exam, assessment and plan as outlined above. I was immediately available for face-to-face consultation. Star Age, MD, PhD [facility])

## 2015-09-27 NOTE — Patient Instructions (Signed)
Continue using CPAP nightly  Follow up with opthalmologist If your symptoms worsen or you develop new symptoms please let us know.

## 2015-10-02 DIAGNOSIS — Z01818 Encounter for other preprocedural examination: Secondary | ICD-10-CM | POA: Diagnosis not present

## 2015-10-03 DIAGNOSIS — F333 Major depressive disorder, recurrent, severe with psychotic symptoms: Secondary | ICD-10-CM | POA: Diagnosis not present

## 2015-10-03 DIAGNOSIS — F4312 Post-traumatic stress disorder, chronic: Secondary | ICD-10-CM | POA: Diagnosis not present

## 2015-10-10 ENCOUNTER — Ambulatory Visit: Payer: Medicare Other | Admitting: Neurology

## 2015-10-10 DIAGNOSIS — F4312 Post-traumatic stress disorder, chronic: Secondary | ICD-10-CM | POA: Diagnosis not present

## 2015-10-10 DIAGNOSIS — F333 Major depressive disorder, recurrent, severe with psychotic symptoms: Secondary | ICD-10-CM | POA: Diagnosis not present

## 2015-10-18 LAB — HM DIABETES EYE EXAM

## 2015-10-24 DIAGNOSIS — F4312 Post-traumatic stress disorder, chronic: Secondary | ICD-10-CM | POA: Diagnosis not present

## 2015-10-24 DIAGNOSIS — F333 Major depressive disorder, recurrent, severe with psychotic symptoms: Secondary | ICD-10-CM | POA: Diagnosis not present

## 2015-11-07 DIAGNOSIS — F333 Major depressive disorder, recurrent, severe with psychotic symptoms: Secondary | ICD-10-CM | POA: Diagnosis not present

## 2015-11-07 DIAGNOSIS — F4312 Post-traumatic stress disorder, chronic: Secondary | ICD-10-CM | POA: Diagnosis not present

## 2015-11-17 DIAGNOSIS — F840 Autistic disorder: Secondary | ICD-10-CM | POA: Diagnosis not present

## 2015-11-17 DIAGNOSIS — F4312 Post-traumatic stress disorder, chronic: Secondary | ICD-10-CM | POA: Diagnosis not present

## 2015-11-17 DIAGNOSIS — F333 Major depressive disorder, recurrent, severe with psychotic symptoms: Secondary | ICD-10-CM | POA: Diagnosis not present

## 2015-11-29 DIAGNOSIS — Z113 Encounter for screening for infections with a predominantly sexual mode of transmission: Secondary | ICD-10-CM | POA: Diagnosis not present

## 2015-11-29 DIAGNOSIS — Z114 Encounter for screening for human immunodeficiency virus [HIV]: Secondary | ICD-10-CM | POA: Diagnosis not present

## 2015-11-29 DIAGNOSIS — B373 Candidiasis of vulva and vagina: Secondary | ICD-10-CM | POA: Diagnosis not present

## 2015-11-29 DIAGNOSIS — L723 Sebaceous cyst: Secondary | ICD-10-CM | POA: Diagnosis not present

## 2015-11-29 DIAGNOSIS — Z118 Encounter for screening for other infectious and parasitic diseases: Secondary | ICD-10-CM | POA: Diagnosis not present

## 2015-11-29 DIAGNOSIS — Z1159 Encounter for screening for other viral diseases: Secondary | ICD-10-CM | POA: Diagnosis not present

## 2015-11-30 DIAGNOSIS — F333 Major depressive disorder, recurrent, severe with psychotic symptoms: Secondary | ICD-10-CM | POA: Diagnosis not present

## 2015-11-30 DIAGNOSIS — F840 Autistic disorder: Secondary | ICD-10-CM | POA: Diagnosis not present

## 2015-11-30 DIAGNOSIS — F4312 Post-traumatic stress disorder, chronic: Secondary | ICD-10-CM | POA: Diagnosis not present

## 2015-12-15 DIAGNOSIS — F4312 Post-traumatic stress disorder, chronic: Secondary | ICD-10-CM | POA: Diagnosis not present

## 2015-12-15 DIAGNOSIS — F333 Major depressive disorder, recurrent, severe with psychotic symptoms: Secondary | ICD-10-CM | POA: Diagnosis not present

## 2015-12-15 DIAGNOSIS — F840 Autistic disorder: Secondary | ICD-10-CM | POA: Diagnosis not present

## 2016-01-11 DIAGNOSIS — F840 Autistic disorder: Secondary | ICD-10-CM | POA: Diagnosis not present

## 2016-01-11 DIAGNOSIS — F4312 Post-traumatic stress disorder, chronic: Secondary | ICD-10-CM | POA: Diagnosis not present

## 2016-01-11 DIAGNOSIS — F333 Major depressive disorder, recurrent, severe with psychotic symptoms: Secondary | ICD-10-CM | POA: Diagnosis not present

## 2016-01-23 DIAGNOSIS — F840 Autistic disorder: Secondary | ICD-10-CM | POA: Diagnosis not present

## 2016-01-23 DIAGNOSIS — F333 Major depressive disorder, recurrent, severe with psychotic symptoms: Secondary | ICD-10-CM | POA: Diagnosis not present

## 2016-01-23 DIAGNOSIS — F4312 Post-traumatic stress disorder, chronic: Secondary | ICD-10-CM | POA: Diagnosis not present

## 2016-02-06 DIAGNOSIS — F4312 Post-traumatic stress disorder, chronic: Secondary | ICD-10-CM | POA: Diagnosis not present

## 2016-02-06 DIAGNOSIS — F333 Major depressive disorder, recurrent, severe with psychotic symptoms: Secondary | ICD-10-CM | POA: Diagnosis not present

## 2016-02-06 DIAGNOSIS — F840 Autistic disorder: Secondary | ICD-10-CM | POA: Diagnosis not present

## 2016-04-09 ENCOUNTER — Encounter: Payer: Self-pay | Admitting: Physician Assistant

## 2016-04-09 ENCOUNTER — Ambulatory Visit (INDEPENDENT_AMBULATORY_CARE_PROVIDER_SITE_OTHER): Payer: Medicare Other | Admitting: Physician Assistant

## 2016-04-09 VITALS — BP 120/78 | HR 71 | Temp 97.8°F | Resp 16 | Ht 67.0 in | Wt 289.0 lb

## 2016-04-09 DIAGNOSIS — R300 Dysuria: Secondary | ICD-10-CM | POA: Diagnosis not present

## 2016-04-09 LAB — POCT URINALYSIS DIPSTICK
Bilirubin, UA: NEGATIVE
Glucose, UA: NEGATIVE
Ketones, UA: NEGATIVE
NITRITE UA: NEGATIVE
PH UA: 7
PROTEIN UA: NEGATIVE
RBC UA: NEGATIVE
Spec Grav, UA: 1.01
Urobilinogen, UA: 0.2

## 2016-04-09 MED ORDER — NITROFURANTOIN MONOHYD MACRO 100 MG PO CAPS
100.0000 mg | ORAL_CAPSULE | Freq: Two times a day (BID) | ORAL | 0 refills | Status: DC
Start: 2016-04-09 — End: 2016-06-19

## 2016-04-09 MED ORDER — FLUCONAZOLE 150 MG PO TABS: 150.0000 mg | ORAL_TABLET | Freq: Every day | ORAL | 0 refills | Status: DC

## 2016-04-09 NOTE — Addendum Note (Signed)
Addended by: Leonidas Romberg on: 04/09/2016 04:17 PM   Modules accepted: Orders

## 2016-04-09 NOTE — Progress Notes (Signed)
Pre visit review using our clinic review tool, if applicable. No additional management support is needed unless otherwise documented below in the visit note. 

## 2016-04-09 NOTE — Patient Instructions (Signed)
Your symptoms are consistent with a bladder infection, also called acute cystitis. Please take your antibiotic (Macrobid) as directed until all pills are gone.  Stay very well hydrated. Take Diflucan as directed Consider a daily probiotic (Align, Culturelle, or Activia) to help prevent stomach upset caused by the antibiotic.  Taking a probiotic daily may also help prevent recurrent UTIs.  Also consider taking AZO (Phenazopyridine) tablets to help decrease pain with urination.  I will call you with your urine testing results.  We will change antibiotics if indicated.  Call or return to clinic if symptoms are not resolved by completion of antibiotic.   Urinary Tract Infection A urinary tract infection (UTI) can occur any place along the urinary tract. The tract includes the kidneys, ureters, bladder, and urethra. A type of germ called bacteria often causes a UTI. UTIs are often helped with antibiotic medicine.  HOME CARE   If given, take antibiotics as told by your doctor. Finish them even if you start to feel better.  Drink enough fluids to keep your pee (urine) clear or pale yellow.  Avoid tea, drinks with caffeine, and bubbly (carbonated) drinks.  Pee often. Avoid holding your pee in for a long time.  Pee before and after having sex (intercourse).  Wipe from front to back after you poop (bowel movement) if you are a woman. Use each tissue only once. GET HELP RIGHT AWAY IF:   You have back pain.  You have lower belly (abdominal) pain.  You have chills.  You feel sick to your stomach (nauseous).  You throw up (vomit).  Your burning or discomfort with peeing does not go away.  You have a fever.  Your symptoms are not better in 3 days. MAKE SURE YOU:   Understand these instructions.  Will watch your condition.  Will get help right away if you are not doing well or get worse. Document Released: 10/30/2007 Document Revised: 02/05/2012 Document Reviewed: 12/12/2011 [facility]  Patient Information 2015 [place], [place]. This information is not intended to replace advice given to you by your health care provider. Make sure you discuss any questions you have with your health care provider.

## 2016-04-09 NOTE — Progress Notes (Signed)
Patient presents to clinic today c/o 1 day of urinary urgency, frequency, uncomfortable urination with hesitancy. Notes suprapubic pressure.. Denies fever, chills, nausea/vomiting. Denies abdominal pain. Denies any vaginal symptoms at present. Patient endorses significant history of UTI. Also notes history of yeast with ABX.  Past Medical History:  Diagnosis Date  . Anxiety   . Depression   . Diabetes mellitus    notes borderline  . Headache(784.0)   . High blood pressure    off meds at this time  . Hyperlipidemia   . Mania ([place])   . Obesity   . OSA on CPAP 03/18/2013  . Psychosis   . PTSD (post-traumatic stress disorder)   . Schizoaffective disorder     Current Outpatient Prescriptions on File Prior to Visit  Medication Sig Dispense Refill  . metFORMIN (GLUCOPHAGE XR) 500 MG 24 hr tablet Take 500 mg by mouth.     No current facility-administered medications on file prior to visit.     Allergies  Allergen Reactions  . Latex Itching and Rash  . Penicillins Swelling    Swelling in hands; states she still takes it as needed  . Tegretol [Carbamazepine] Rash  . Vicodin [Hydrocodone-Acetaminophen] Rash    Family History  Problem Relation Age of Onset  . Alcohol abuse Mother   . Depression Mother   . Alcohol abuse Brother   . Suicidality Maternal Grandmother   . Anorexia nervosa Maternal Grandmother   . Asperger's syndrome Daughter   . Schizophrenia Father   . Brain cancer Father   . Early death Father     Social History   Social History  . Marital status: Single    Spouse name: N/A  . Number of children: N/A  . Years of education: N/A   Social History Main Topics  . Smoking status: Never Smoker  . Smokeless tobacco: Never Used  . Alcohol use No  . Drug use: No  . Sexual activity: Not Currently   Other Topics Concern  . None   Social History Narrative  . None   Review of Systems - See HPI.  All other ROS are negative.  BP 120/78   Pulse 71    Temp 97.8 F (36.6 C) (Oral)   Resp 16   Ht 5\' 7"  (1.702 m)   Wt 289 lb (131.1 kg)   SpO2 99%   BMI 45.26 kg/m   Physical Exam  Constitutional: She is oriented to person, place, and time and well-developed, well-nourished, and in no distress.  HENT:  Head: Normocephalic and atraumatic.  Eyes: Conjunctivae are normal.  Cardiovascular: Normal rate, regular rhythm, normal heart sounds and intact distal pulses.   Pulmonary/Chest: Effort normal and breath sounds normal. No respiratory distress. She has no wheezes. She has no rales. She exhibits no tenderness.  Abdominal: Soft. Bowel sounds are normal. There is no tenderness.  Negative CVA tenderness  Neurological: She is alert and oriented to person, place, and time.  Skin: Skin is warm and dry. No rash noted.  Psychiatric: Affect normal.  Vitals reviewed.  Assessment/Plan: 1. Dysuria Urine dip with LE. Giving classic symptoms and significant history, will start Macrobid. Urine culture sent. Supportive measures reviewed. Will give 1 diflucan in case of yeast vaginitis with ABX use. FU if not resolving. Will alter regimen based on culture results.  - POCT Urinalysis Dipstick - nitrofurantoin, macrocrystal-monohydrate, (MACROBID) 100 MG capsule; Take 1 capsule (100 mg total) by mouth 2 (two) times daily.  Dispense: 10 capsule; Refill:  0 - fluconazole (DIFLUCAN) 150 MG tablet; Take 1 tablet (150 mg total) by mouth daily.  Dispense: 1 tablet; Refill: 0   Leeanne Rio, [place]

## 2016-04-11 LAB — URINE CULTURE

## 2016-04-16 ENCOUNTER — Encounter: Payer: Self-pay | Admitting: Emergency Medicine

## 2016-05-01 ENCOUNTER — Encounter: Payer: Self-pay | Admitting: Emergency Medicine

## 2016-05-09 DIAGNOSIS — N83201 Unspecified ovarian cyst, right side: Secondary | ICD-10-CM | POA: Diagnosis not present

## 2016-05-09 DIAGNOSIS — N83202 Unspecified ovarian cyst, left side: Secondary | ICD-10-CM | POA: Diagnosis not present

## 2016-06-19 ENCOUNTER — Encounter: Payer: Self-pay | Admitting: Family Medicine

## 2016-06-19 ENCOUNTER — Ambulatory Visit (INDEPENDENT_AMBULATORY_CARE_PROVIDER_SITE_OTHER): Payer: Medicare Other | Admitting: Family Medicine

## 2016-06-19 VITALS — BP 122/80 | HR 76 | Temp 98.1°F | Resp 16 | Ht 67.0 in | Wt 289.2 lb

## 2016-06-19 DIAGNOSIS — Z23 Encounter for immunization: Secondary | ICD-10-CM

## 2016-06-19 DIAGNOSIS — R141 Gas pain: Secondary | ICD-10-CM | POA: Diagnosis not present

## 2016-06-19 DIAGNOSIS — Z0000 Encounter for general adult medical examination without abnormal findings: Secondary | ICD-10-CM

## 2016-06-19 DIAGNOSIS — E282 Polycystic ovarian syndrome: Secondary | ICD-10-CM

## 2016-06-19 DIAGNOSIS — E669 Obesity, unspecified: Secondary | ICD-10-CM | POA: Insufficient documentation

## 2016-06-19 LAB — CBC WITH DIFFERENTIAL/PLATELET
BASOS ABS: 0 10*3/uL (ref 0.0–0.1)
Basophils Relative: 0.4 % (ref 0.0–3.0)
EOS ABS: 0.2 10*3/uL (ref 0.0–0.7)
Eosinophils Relative: 2.3 % (ref 0.0–5.0)
HCT: 39.2 % (ref 36.0–46.0)
Hemoglobin: 12.8 g/dL (ref 12.0–15.0)
LYMPHS ABS: 2.3 10*3/uL (ref 0.7–4.0)
LYMPHS PCT: 22.6 % (ref 12.0–46.0)
MCHC: 32.6 g/dL (ref 30.0–36.0)
MCV: 76.5 fl — ABNORMAL LOW (ref 78.0–100.0)
MONO ABS: 0.4 10*3/uL (ref 0.1–1.0)
Monocytes Relative: 4.5 % (ref 3.0–12.0)
NEUTROS PCT: 70.2 % (ref 43.0–77.0)
Neutro Abs: 7.1 10*3/uL (ref 1.4–7.7)
PLATELETS: 202 10*3/uL (ref 150.0–400.0)
RBC: 5.12 Mil/uL — ABNORMAL HIGH (ref 3.87–5.11)
RDW: 15.5 % (ref 11.5–15.5)
WBC: 10.1 10*3/uL (ref 4.0–10.5)

## 2016-06-19 LAB — LIPID PANEL
CHOLESTEROL: 112 mg/dL (ref 0–200)
HDL: 33.5 mg/dL — AB (ref >39.00)
LDL Cholesterol: 51 mg/dL (ref 0–99)
NonHDL: 78.43
Total CHOL/HDL Ratio: 3
Triglycerides: 138 mg/dL (ref 0.0–149.0)
VLDL: 27.6 mg/dL (ref 0.0–40.0)

## 2016-06-19 LAB — BASIC METABOLIC PANEL
BUN: 15 mg/dL (ref 6–23)
CALCIUM: 9.7 mg/dL (ref 8.4–10.5)
CO2: 29 meq/L (ref 19–32)
Chloride: 105 mEq/L (ref 96–112)
Creatinine, Ser: 0.6 mg/dL (ref 0.40–1.20)
GFR: 117.9 mL/min (ref >60.00)
GLUCOSE: 89 mg/dL (ref 70–99)
Potassium: 4.3 mEq/L (ref 3.5–5.1)
SODIUM: 140 meq/L (ref 135–145)

## 2016-06-19 LAB — HEMOGLOBIN A1C: HEMOGLOBIN A1C: 5.5 % (ref 4.6–6.5)

## 2016-06-19 LAB — HEPATIC FUNCTION PANEL
ALT: 13 U/L (ref 0–35)
AST: 10 U/L (ref 0–37)
Albumin: 4.3 g/dL (ref 3.5–5.2)
Alkaline Phosphatase: 78 U/L (ref 39–117)
Bilirubin, Direct: 0.2 mg/dL (ref 0.0–0.3)
TOTAL PROTEIN: 7.6 g/dL (ref 6.0–8.3)
Total Bilirubin: 1 mg/dL (ref 0.2–1.2)

## 2016-06-19 LAB — TSH: TSH: 1.1 u[IU]/mL (ref 0.35–4.50)

## 2016-06-19 MED ORDER — METFORMIN HCL ER 500 MG PO TB24: 500.0000 mg | ORAL_TABLET | Freq: Every day | ORAL | 6 refills | Status: DC

## 2016-06-19 MED ORDER — MELOXICAM 15 MG PO TABS: 15.0000 mg | ORAL_TABLET | Freq: Every day | ORAL | 3 refills | Status: DC

## 2016-06-19 MED ORDER — CYCLOBENZAPRINE HCL 10 MG PO TABS: 10.0000 mg | ORAL_TABLET | Freq: Three times a day (TID) | ORAL | 3 refills | Status: DC | PRN

## 2016-06-19 NOTE — Assessment & Plan Note (Signed)
Pt's PE WNL w/ exception of obesity.  Due for TDap- given today.  UTD on GYN.  Written screening schedule updated and given to pt.  Check labs.  Anticipatory guidance provided.

## 2016-06-19 NOTE — Assessment & Plan Note (Signed)
Ongoing issue for pt.  She has actually lost a considerable amount weight despite her BMI being 45.4.  She is exercising regularly and attempting to make healthy food choices.  Applauded her efforts.  Check labs to risk stratify.  Will follow.

## 2016-06-19 NOTE — Progress Notes (Signed)
   Subjective:    Patient ID: Michaela Norris, female    DOB: Jan 28, 1976, 41 y.o.   MRN: [id number]  HPI Here today for CPE.  Risk Factors: PCOS- chronic problem.  Asking to restart Metformin and see a new GYN.  Not currently on OCPs for menstrual regularity Morbid obesity- ongoing issue for pt.  She has lost considerable weight but BMI is still 45.4.  Walking regularly.  Plans to resume swimming. Possible gluten intolerance- daughter was dx'd w/ celiac disease and pt notes that when she eats gluten she will have gas, abd pain, alternating diarrhea and constipation.  Asking for test to assess for celiac disease. Physical Activity: walking regularly, swimming Fall Risk: low Depression: currently well controlled, 'better than ever' Hearing: normal to conversational tones and whispered voice ADL's: independent Cognitive: normal linear thought process, memory and attention intact Home Safety: safe at home Height, Weight, BMI, Visual Acuity: see vitals, vision corrected to 20/20 w/ glasses Counseling: due for Tdap, UTD on pap, too young for mammo.  UTD on flu shot Care team reviewed w/ pt Labs Ordered: See A&P Care Plan: See A&P    Review of Systems Patient reports no vision/ hearing changes, adenopathy,fever, weight change,  persistant/recurrent hoarseness , swallowing issues, chest pain, palpitations, edema, persistant/recurrent cough, hemoptysis, dyspnea (rest/exertional/paroxysmal nocturnal), gastrointestinal bleeding (melena, rectal bleeding), abdominal pain, significant heartburn, bowel changes, GU symptoms (dysuria, hematuria, incontinence), Gyn symptoms (abnormal  bleeding, pain),  syncope, focal weakness, memory loss, numbness & tingling, skin/hair/nail changes, abnormal bruising or bleeding, anxiety, or depression.     Objective:   Physical Exam        Assessment & Plan:

## 2016-06-19 NOTE — Assessment & Plan Note (Signed)
Chronic problem.  Pt is asking to restart Metformin while she awaits her new GYN appt.  Will restart and refer for ongoing tx.  Pt expressed understanding and is in agreement w/ plan.

## 2016-06-19 NOTE — Assessment & Plan Note (Signed)
Recurrent problem for pt.  sxs had improved when she was gluten free but she is again eating gluten and again symptomatic.  Daughter has been dx'd w/ Celiac and pt is wondering if she also has this.  Will order celiac panel and refer to GI prn.  Pt expressed understanding and is in agreement w/ plan.

## 2016-06-19 NOTE — Addendum Note (Signed)
Addended by: Desmond Dike L on: 06/19/2016 10:50 AM   Modules accepted: Orders

## 2016-06-19 NOTE — Progress Notes (Signed)
Pre visit review using our clinic review tool, if applicable. No additional management support is needed unless otherwise documented below in the visit note. 

## 2016-06-19 NOTE — Patient Instructions (Addendum)
Follow up in 6 months to recheck weight loss progress We'll notify you of your lab results and make any changes if needed Continue to work on healthy diet and regular exercise- you're doing it! Restart the Metformin once daily- take w/ food We'll call you with your GYN referral With your Tdap today, you are up to date on your immunizations Call with any questions or concerns Happy New Year!!!

## 2016-06-20 LAB — GLIADIN ANTIBODIES, SERUM
GLIADIN IGG: 19 U (ref <20)
Gliadin IgA: 18 Units (ref <20)

## 2016-06-20 LAB — TISSUE TRANSGLUTAMINASE, IGA: TISSUE TRANSGLUTAMINASE AB, IGA: 5 U/mL — AB (ref <4)

## 2016-06-22 LAB — RETICULIN ANTIBODIES, IGA W TITER: RETICULIN AB, IGA: NEGATIVE

## 2016-07-09 ENCOUNTER — Telehealth: Payer: Self-pay | Admitting: Family Medicine

## 2016-07-09 NOTE — Telephone Encounter (Signed)
Called [facility], they have received all office notes for pt.

## 2016-07-09 NOTE — Telephone Encounter (Signed)
Pt has been scheduled.  °

## 2016-07-09 NOTE — Telephone Encounter (Signed)
Calling for referral to GYN.  Thank you,  -LL

## 2016-07-09 NOTE — Telephone Encounter (Signed)
Can you check on this referral? It was placed on 06/19/16 says NEW in Chart.

## 2016-07-09 NOTE — Telephone Encounter (Signed)
Patient received a call from [facility] and they are able to get her in earlier tomorrow at 9:40am. They are requesting her records be sent over via fax this afternoon. Please give patient a call at [phone] once this is completed today.  Thank you.

## 2016-07-10 DIAGNOSIS — N390 Urinary tract infection, site not specified: Secondary | ICD-10-CM | POA: Diagnosis not present

## 2016-07-10 DIAGNOSIS — Z124 Encounter for screening for malignant neoplasm of cervix: Secondary | ICD-10-CM | POA: Diagnosis not present

## 2016-07-10 DIAGNOSIS — R87615 Unsatisfactory cytologic smear of cervix: Secondary | ICD-10-CM | POA: Diagnosis not present

## 2016-07-10 DIAGNOSIS — B373 Candidiasis of vulva and vagina: Secondary | ICD-10-CM | POA: Diagnosis not present

## 2016-07-10 DIAGNOSIS — Z6841 Body Mass Index (BMI) 40.0 and over, adult: Secondary | ICD-10-CM | POA: Diagnosis not present

## 2016-07-10 DIAGNOSIS — Z309 Encounter for contraceptive management, unspecified: Secondary | ICD-10-CM | POA: Diagnosis not present

## 2016-07-10 DIAGNOSIS — Z01419 Encounter for gynecological examination (general) (routine) without abnormal findings: Secondary | ICD-10-CM | POA: Diagnosis not present

## 2016-07-10 DIAGNOSIS — E282 Polycystic ovarian syndrome: Secondary | ICD-10-CM | POA: Diagnosis not present

## 2016-07-10 DIAGNOSIS — N920 Excessive and frequent menstruation with regular cycle: Secondary | ICD-10-CM | POA: Diagnosis not present

## 2016-07-10 DIAGNOSIS — B379 Candidiasis, unspecified: Secondary | ICD-10-CM | POA: Diagnosis not present

## 2016-07-10 LAB — HM PAP SMEAR

## 2016-07-26 DIAGNOSIS — R87615 Unsatisfactory cytologic smear of cervix: Secondary | ICD-10-CM | POA: Diagnosis not present

## 2016-07-31 ENCOUNTER — Encounter: Payer: Self-pay | Admitting: General Practice

## 2016-08-06 DIAGNOSIS — N939 Abnormal uterine and vaginal bleeding, unspecified: Secondary | ICD-10-CM | POA: Diagnosis not present

## 2016-08-06 DIAGNOSIS — R1031 Right lower quadrant pain: Secondary | ICD-10-CM | POA: Diagnosis not present

## 2016-09-10 DIAGNOSIS — B373 Candidiasis of vulva and vagina: Secondary | ICD-10-CM | POA: Diagnosis not present

## 2016-10-22 ENCOUNTER — Encounter (HOSPITAL_COMMUNITY): Payer: Self-pay | Admitting: Emergency Medicine

## 2016-10-22 ENCOUNTER — Ambulatory Visit (HOSPITAL_COMMUNITY)
Admission: EM | Admit: 2016-10-22 | Discharge: 2016-10-22 | Disposition: A | Discharge status: Home / Self Care | Payer: Medicare Other | Attending: Family Medicine | Admitting: Family Medicine

## 2016-10-22 DIAGNOSIS — M545 Low back pain, unspecified: Secondary | ICD-10-CM

## 2016-10-22 MED ORDER — METHYLPREDNISOLONE 4 MG PO TBPK: ORAL_TABLET | ORAL | 0 refills | Status: DC

## 2016-10-22 MED ORDER — KETOROLAC TROMETHAMINE 30 MG/ML IJ SOLN
INTRAMUSCULAR | Status: AC
  Filled 2016-10-22: qty 1

## 2016-10-22 MED ORDER — OMEPRAZOLE 20 MG PO CPDR: 20.0000 mg | DELAYED_RELEASE_CAPSULE | Freq: Every day | ORAL | 0 refills | Status: DC

## 2016-10-22 MED ORDER — CYCLOBENZAPRINE HCL 10 MG PO TABS: 10.0000 mg | ORAL_TABLET | Freq: Three times a day (TID) | ORAL | 0 refills | Status: DC | PRN

## 2016-10-22 MED ORDER — KETOROLAC TROMETHAMINE 30 MG/ML IJ SOLN
30.0000 mg | Freq: Once | INTRAMUSCULAR | Status: AC
  Administered 2016-10-22: 30 mg via INTRAMUSCULAR

## 2016-10-22 NOTE — ED Triage Notes (Signed)
Chronic back pain.  2 weeks ago bruised tailbone while riding a roller coaster.  Patient has been sitting differently since then.  Patient has also been camping and lying on the ground

## 2016-10-22 NOTE — ED Provider Notes (Signed)
CSN: [id number]     Arrival date & time 10/22/16  1912 History   First MD Initiated Contact with Patient 10/22/16 2027     Chief Complaint  Patient presents with  . Back Pain   (Consider location/radiation/quality/duration/timing/severity/associated sxs/prior Treatment) Patient states she is having severe back pain in her left lumbar spine more so than her right lumbar spine.  She has been taking flexeril and meloxicam at home and is not better.  She wants to know if she can get a shot of flexeril.   The history is provided by the patient.  Back Pain  Location:  Lumbar spine Quality:  Aching Pain severity:  Moderate Pain is:  Worse during the day Onset quality:  Sudden Duration:  1 week Timing:  Constant Chronicity:  New Relieved by:  Nothing Worsened by:  Nothing Ineffective treatments:  None tried   Past Medical History:  Diagnosis Date  . Anxiety   . Depression   . Diabetes mellitus    notes borderline  . Headache(784.0)   . High blood pressure    off meds at this time  . Hyperlipidemia   . Mania ([place])   . Obesity   . OSA on CPAP 03/18/2013  . Psychosis   . PTSD (post-traumatic stress disorder)   . Schizoaffective disorder    Past Surgical History:  Procedure Laterality Date  . broken ankle  2000  . CHOLECYSTECTOMY    . FRACTURE SURGERY     Family History  Problem Relation Age of Onset  . Alcohol abuse Mother   . Depression Mother   . Alcohol abuse Brother   . Suicidality Maternal Grandmother   . Anorexia nervosa Maternal Grandmother   . Asperger's syndrome Daughter   . Schizophrenia Father   . Brain cancer Father   . Early death Father    Social History  Substance Use Topics  . Smoking status: Never Smoker  . Smokeless tobacco: Never Used  . Alcohol use No   OB History    No data available     Review of Systems  Constitutional: Negative.   HENT: Negative.   Eyes: Negative.   Respiratory: Negative.   Cardiovascular: Negative.    Gastrointestinal: Negative.   Endocrine: Negative.   Genitourinary: Negative.   Musculoskeletal: Positive for back pain.  Skin: Negative.   Allergic/Immunologic: Negative.   Neurological: Negative.   Hematological: Negative.   Psychiatric/Behavioral: Negative.     Allergies  Latex; Penicillins; Tegretol [carbamazepine]; and Vicodin [hydrocodone-acetaminophen]  Home Medications   Prior to Admission medications   Medication Sig Start Date End Date Taking? Authorizing Provider  cyclobenzaprine (FLEXERIL) 10 MG tablet Take 1 tablet (10 mg total) by mouth 3 (three) times daily as needed for muscle spasms. 06/19/16  Yes Midge Minium, MD  meloxicam (MOBIC) 15 MG tablet Take 1 tablet (15 mg total) by mouth daily. 06/19/16  Yes Midge Minium, MD  metFORMIN (GLUCOPHAGE XR) 500 MG 24 hr tablet Take 1 tablet (500 mg total) by mouth daily with breakfast. 06/19/16 06/19/17 Yes Midge Minium, MD  cyclobenzaprine (FLEXERIL) 10 MG tablet Take 1 tablet (10 mg total) by mouth 3 (three) times daily as needed for muscle spasms. 10/22/16   Lysbeth Penner, FNP  methylPREDNISolone (MEDROL DOSEPAK) 4 MG TBPK tablet Take 6-5-4-3-2-1 po qd 10/22/16   Lysbeth Penner, FNP  NORLYDA 0.35 MG tablet  06/15/16   [provider]  omeprazole (PRILOSEC) 20 MG capsule Take 1 capsule (20 mg total)  by mouth daily. 10/22/16   Lysbeth Penner, FNP   Meds Ordered and Administered this Visit   Medications  ketorolac (TORADOL) 30 MG/ML injection 30 mg (30 mg Intramuscular Given 10/22/16 2042)    BP 138/80 (BP Location: Right Arm) Comment (BP Location): regular, forearm  Pulse 75   Temp 98.3 F (36.8 C) (Oral)   Resp (!) 22   LMP 09/13/2016   SpO2 100%  No data found.   Physical Exam  Constitutional: She is oriented to person, place, and time. She appears well-developed and well-nourished.  HENT:  Head: Normocephalic and atraumatic.  Eyes: Conjunctivae and EOM are normal. Pupils are  equal, round, and reactive to light.  Neck: Normal range of motion. Neck supple.  Cardiovascular: Normal rate, regular rhythm and normal heart sounds.   Pulmonary/Chest: Effort normal and breath sounds normal.  Musculoskeletal: She exhibits tenderness.  TTP left lumbar paraspinous muscle.  Patient unable to attempt ROM of LS spine.  Neurological: She is alert and oriented to person, place, and time.  Nursing note and vitals reviewed.   Urgent Care Course     Procedures (including critical care time)  Labs Review Labs Reviewed - No data to display  Imaging Review No results found.   Visual Acuity Review  Right Eye Distance:   Left Eye Distance:   Bilateral Distance:    Right Eye Near:   Left Eye Near:    Bilateral Near:         MDM   1. Low back pain without sciatica, unspecified back pain laterality, unspecified chronicity    Medrol dose pack as directed Continue Meloxicam Prilosec otc Flexeril 10mg  one po bid Toradol 30mg  IM      Lysbeth Penner, FNP 10/22/16 2107

## 2016-11-08 DIAGNOSIS — N9089 Other specified noninflammatory disorders of vulva and perineum: Secondary | ICD-10-CM | POA: Diagnosis not present

## 2016-11-08 DIAGNOSIS — N762 Acute vulvitis: Secondary | ICD-10-CM | POA: Diagnosis not present

## 2016-11-08 DIAGNOSIS — B379 Candidiasis, unspecified: Secondary | ICD-10-CM | POA: Diagnosis not present

## 2016-11-09 ENCOUNTER — Encounter (HOSPITAL_COMMUNITY): Payer: Self-pay | Admitting: Emergency Medicine

## 2016-11-09 ENCOUNTER — Inpatient Hospital Stay (HOSPITAL_COMMUNITY)
Admission: EM | Admit: 2016-11-09 | Discharge: 2016-11-11 | DRG: 098 | Disposition: A | Discharge status: Home / Self Care | Payer: Medicare Other | Attending: Internal Medicine | Admitting: Internal Medicine

## 2016-11-09 DIAGNOSIS — Z811 Family history of alcohol abuse and dependence: Secondary | ICD-10-CM

## 2016-11-09 DIAGNOSIS — E662 Morbid (severe) obesity with alveolar hypoventilation: Secondary | ICD-10-CM | POA: Diagnosis present

## 2016-11-09 DIAGNOSIS — Z9049 Acquired absence of other specified parts of digestive tract: Secondary | ICD-10-CM | POA: Diagnosis not present

## 2016-11-09 DIAGNOSIS — Z6841 Body Mass Index (BMI) 40.0 and over, adult: Secondary | ICD-10-CM

## 2016-11-09 DIAGNOSIS — G030 Nonpyogenic meningitis: Principal | ICD-10-CM | POA: Diagnosis present

## 2016-11-09 DIAGNOSIS — G4733 Obstructive sleep apnea (adult) (pediatric): Secondary | ICD-10-CM

## 2016-11-09 DIAGNOSIS — I10 Essential (primary) hypertension: Secondary | ICD-10-CM | POA: Diagnosis not present

## 2016-11-09 DIAGNOSIS — E119 Type 2 diabetes mellitus without complications: Secondary | ICD-10-CM | POA: Diagnosis not present

## 2016-11-09 DIAGNOSIS — R51 Headache: Secondary | ICD-10-CM | POA: Diagnosis not present

## 2016-11-09 DIAGNOSIS — R21 Rash and other nonspecific skin eruption: Secondary | ICD-10-CM

## 2016-11-09 DIAGNOSIS — Z791 Long term (current) use of non-steroidal anti-inflammatories (NSAID): Secondary | ICD-10-CM | POA: Diagnosis not present

## 2016-11-09 DIAGNOSIS — G039 Meningitis, unspecified: Secondary | ICD-10-CM | POA: Diagnosis present

## 2016-11-09 DIAGNOSIS — E669 Obesity, unspecified: Secondary | ICD-10-CM | POA: Diagnosis present

## 2016-11-09 DIAGNOSIS — Z808 Family history of malignant neoplasm of other organs or systems: Secondary | ICD-10-CM

## 2016-11-09 DIAGNOSIS — R519 Headache, unspecified: Secondary | ICD-10-CM

## 2016-11-09 DIAGNOSIS — Z7982 Long term (current) use of aspirin: Secondary | ICD-10-CM

## 2016-11-09 DIAGNOSIS — F4310 Post-traumatic stress disorder, unspecified: Secondary | ICD-10-CM | POA: Diagnosis not present

## 2016-11-09 DIAGNOSIS — Z818 Family history of other mental and behavioral disorders: Secondary | ICD-10-CM

## 2016-11-09 DIAGNOSIS — G932 Benign intracranial hypertension: Secondary | ICD-10-CM | POA: Diagnosis present

## 2016-11-09 DIAGNOSIS — W57XXXA Bitten or stung by nonvenomous insect and other nonvenomous arthropods, initial encounter: Secondary | ICD-10-CM | POA: Diagnosis not present

## 2016-11-09 DIAGNOSIS — E785 Hyperlipidemia, unspecified: Secondary | ICD-10-CM | POA: Diagnosis present

## 2016-11-09 DIAGNOSIS — Z79899 Other long term (current) drug therapy: Secondary | ICD-10-CM

## 2016-11-09 DIAGNOSIS — Z7984 Long term (current) use of oral hypoglycemic drugs: Secondary | ICD-10-CM | POA: Diagnosis not present

## 2016-11-09 DIAGNOSIS — R1013 Epigastric pain: Secondary | ICD-10-CM | POA: Diagnosis present

## 2016-11-09 DIAGNOSIS — R509 Fever, unspecified: Secondary | ICD-10-CM

## 2016-11-09 DIAGNOSIS — Z880 Allergy status to penicillin: Secondary | ICD-10-CM | POA: Diagnosis not present

## 2016-11-09 DIAGNOSIS — Z9989 Dependence on other enabling machines and devices: Secondary | ICD-10-CM

## 2016-11-09 LAB — COMPREHENSIVE METABOLIC PANEL
ALBUMIN: 3.5 g/dL (ref 3.5–5.0)
ALT: 23 U/L (ref 14–54)
ANION GAP: 7 (ref 5–15)
AST: 22 U/L (ref 15–41)
Alkaline Phosphatase: 70 U/L (ref 38–126)
BILIRUBIN TOTAL: 1 mg/dL (ref 0.3–1.2)
BUN: 9 mg/dL (ref 6–20)
CO2: 26 mmol/L (ref 22–32)
Calcium: 8.8 mg/dL — ABNORMAL LOW (ref 8.9–10.3)
Chloride: 103 mmol/L (ref 101–111)
Creatinine, Ser: 0.78 mg/dL (ref 0.44–1.00)
GFR calc Af Amer: >60 mL/min (ref >60)
Glucose, Bld: 123 mg/dL — ABNORMAL HIGH (ref 65–99)
POTASSIUM: 3.8 mmol/L (ref 3.5–5.1)
Sodium: 136 mmol/L (ref 135–145)
TOTAL PROTEIN: 7.2 g/dL (ref 6.5–8.1)

## 2016-11-09 LAB — CBC WITH DIFFERENTIAL/PLATELET
BASOS ABS: 0 10*3/uL (ref 0.0–0.1)
Basophils Relative: 0 %
Eosinophils Absolute: 0.1 10*3/uL (ref 0.0–0.7)
Eosinophils Relative: 2 %
HEMATOCRIT: 35.1 % — AB (ref 36.0–46.0)
HEMOGLOBIN: 10.7 g/dL — AB (ref 12.0–15.0)
LYMPHS PCT: 23 %
Lymphs Abs: 1.1 10*3/uL (ref 0.7–4.0)
MCH: 21.9 pg — ABNORMAL LOW (ref 26.0–34.0)
MCHC: 30.5 g/dL (ref 30.0–36.0)
MCV: 71.9 fL — ABNORMAL LOW (ref 78.0–100.0)
MONOS PCT: 7 %
Monocytes Absolute: 0.3 10*3/uL (ref 0.1–1.0)
Neutro Abs: 3.4 10*3/uL (ref 1.7–7.7)
Neutrophils Relative %: 68 %
Platelets: 179 10*3/uL (ref 150–400)
RBC: 4.88 MIL/uL (ref 3.87–5.11)
RDW: 15.5 % (ref 11.5–15.5)
WBC: 4.9 10*3/uL (ref 4.0–10.5)

## 2016-11-09 LAB — I-STAT BETA HCG BLOOD, ED (MC, WL, AP ONLY): I-stat hCG, quantitative: <5 m[IU]/mL (ref <5)

## 2016-11-09 LAB — I-STAT CG4 LACTIC ACID, ED: Lactic Acid, Venous: 1.44 mmol/L (ref 0.5–1.9)

## 2016-11-09 MED ORDER — ACETAMINOPHEN 325 MG PO TABS
ORAL_TABLET | ORAL | Status: AC
  Filled 2016-11-09: qty 2

## 2016-11-09 MED ORDER — ACETAMINOPHEN 325 MG PO TABS
650.0000 mg | ORAL_TABLET | ORAL | Status: AC
  Administered 2016-11-09: 650 mg via ORAL

## 2016-11-09 NOTE — ED Triage Notes (Signed)
Pt presents to ED for assessment of headache starting shortly after waking and worsening through out the day.  Patient also developed a fever through out the day.  Patient states she had a labia biopsy yesterday for sores and rash developing in that area and r/o Shingles/Herpes.  Patient c/o generalized dizziness, weakness, no neuro deficits.

## 2016-11-10 ENCOUNTER — Emergency Department (HOSPITAL_COMMUNITY): Payer: Medicare Other

## 2016-11-10 ENCOUNTER — Observation Stay (HOSPITAL_COMMUNITY): Payer: Medicare Other

## 2016-11-10 ENCOUNTER — Other Ambulatory Visit: Payer: Self-pay | Admitting: Diagnostic Radiology

## 2016-11-10 DIAGNOSIS — Z888 Allergy status to other drugs, medicaments and biological substances status: Secondary | ICD-10-CM | POA: Diagnosis not present

## 2016-11-10 DIAGNOSIS — E662 Morbid (severe) obesity with alveolar hypoventilation: Secondary | ICD-10-CM | POA: Diagnosis present

## 2016-11-10 DIAGNOSIS — F4310 Post-traumatic stress disorder, unspecified: Secondary | ICD-10-CM | POA: Diagnosis present

## 2016-11-10 DIAGNOSIS — Z885 Allergy status to narcotic agent status: Secondary | ICD-10-CM

## 2016-11-10 DIAGNOSIS — Z880 Allergy status to penicillin: Secondary | ICD-10-CM

## 2016-11-10 DIAGNOSIS — R51 Headache: Secondary | ICD-10-CM

## 2016-11-10 DIAGNOSIS — G039 Meningitis, unspecified: Secondary | ICD-10-CM | POA: Diagnosis present

## 2016-11-10 DIAGNOSIS — I10 Essential (primary) hypertension: Secondary | ICD-10-CM | POA: Diagnosis present

## 2016-11-10 DIAGNOSIS — E282 Polycystic ovarian syndrome: Secondary | ICD-10-CM | POA: Diagnosis not present

## 2016-11-10 DIAGNOSIS — G4733 Obstructive sleep apnea (adult) (pediatric): Secondary | ICD-10-CM | POA: Diagnosis not present

## 2016-11-10 DIAGNOSIS — Z7982 Long term (current) use of aspirin: Secondary | ICD-10-CM | POA: Diagnosis not present

## 2016-11-10 DIAGNOSIS — Z9049 Acquired absence of other specified parts of digestive tract: Secondary | ICD-10-CM | POA: Diagnosis not present

## 2016-11-10 DIAGNOSIS — R21 Rash and other nonspecific skin eruption: Secondary | ICD-10-CM

## 2016-11-10 DIAGNOSIS — Z6841 Body Mass Index (BMI) 40.0 and over, adult: Secondary | ICD-10-CM | POA: Diagnosis not present

## 2016-11-10 DIAGNOSIS — Z7984 Long term (current) use of oral hypoglycemic drugs: Secondary | ICD-10-CM | POA: Diagnosis not present

## 2016-11-10 DIAGNOSIS — Z820 Family history of epilepsy and other diseases of the nervous system: Secondary | ICD-10-CM

## 2016-11-10 DIAGNOSIS — Z818 Family history of other mental and behavioral disorders: Secondary | ICD-10-CM

## 2016-11-10 DIAGNOSIS — Z91040 Latex allergy status: Secondary | ICD-10-CM

## 2016-11-10 DIAGNOSIS — Z811 Family history of alcohol abuse and dependence: Secondary | ICD-10-CM | POA: Diagnosis not present

## 2016-11-10 DIAGNOSIS — W57XXXA Bitten or stung by nonvenomous insect and other nonvenomous arthropods, initial encounter: Secondary | ICD-10-CM

## 2016-11-10 DIAGNOSIS — R509 Fever, unspecified: Secondary | ICD-10-CM

## 2016-11-10 DIAGNOSIS — G932 Benign intracranial hypertension: Secondary | ICD-10-CM

## 2016-11-10 DIAGNOSIS — Z808 Family history of malignant neoplasm of other organs or systems: Secondary | ICD-10-CM

## 2016-11-10 DIAGNOSIS — Z79899 Other long term (current) drug therapy: Secondary | ICD-10-CM | POA: Diagnosis not present

## 2016-11-10 DIAGNOSIS — E119 Type 2 diabetes mellitus without complications: Secondary | ICD-10-CM | POA: Diagnosis present

## 2016-11-10 DIAGNOSIS — G030 Nonpyogenic meningitis: Secondary | ICD-10-CM | POA: Diagnosis present

## 2016-11-10 DIAGNOSIS — E785 Hyperlipidemia, unspecified: Secondary | ICD-10-CM | POA: Diagnosis present

## 2016-11-10 DIAGNOSIS — Z791 Long term (current) use of non-steroidal anti-inflammatories (NSAID): Secondary | ICD-10-CM | POA: Diagnosis not present

## 2016-11-10 LAB — BASIC METABOLIC PANEL
Anion gap: 8 (ref 5–15)
BUN: 10 mg/dL (ref 6–20)
CO2: 23 mmol/L (ref 22–32)
Calcium: 8.8 mg/dL — ABNORMAL LOW (ref 8.9–10.3)
Chloride: 107 mmol/L (ref 101–111)
Creatinine, Ser: 0.77 mg/dL (ref 0.44–1.00)
GFR calc Af Amer: >60 mL/min (ref >60)
GFR calc non Af Amer: >60 mL/min (ref >60)
Glucose, Bld: 142 mg/dL — ABNORMAL HIGH (ref 65–99)
Potassium: 3.9 mmol/L (ref 3.5–5.1)
Sodium: 138 mmol/L (ref 135–145)

## 2016-11-10 LAB — CSF CELL COUNT WITH DIFFERENTIAL
EOS CSF: 7 % — AB (ref 0–1)
Lymphs, CSF: 81 % — ABNORMAL HIGH (ref 40–80)
MONOCYTE-MACROPHAGE-SPINAL FLUID: 3 % — AB (ref 15–45)
RBC Count, CSF: 2 /mm3 — ABNORMAL HIGH
Segmented Neutrophils-CSF: 8 % — ABNORMAL HIGH (ref 0–6)
Tube #: 1
WBC, CSF: 384 /mm3 (ref 0–5)

## 2016-11-10 LAB — URINALYSIS, ROUTINE W REFLEX MICROSCOPIC
Bilirubin Urine: NEGATIVE
GLUCOSE, UA: NEGATIVE mg/dL
Ketones, ur: NEGATIVE mg/dL
Leukocytes, UA: NEGATIVE
Nitrite: NEGATIVE
PH: 8 (ref 5.0–8.0)
PROTEIN: NEGATIVE mg/dL
Specific Gravity, Urine: 1.013 (ref 1.005–1.030)

## 2016-11-10 LAB — CBC
HCT: 34.2 % — ABNORMAL LOW (ref 36.0–46.0)
Hemoglobin: 10.3 g/dL — ABNORMAL LOW (ref 12.0–15.0)
MCH: 22 pg — ABNORMAL LOW (ref 26.0–34.0)
MCHC: 30.1 g/dL (ref 30.0–36.0)
MCV: 72.9 fL — ABNORMAL LOW (ref 78.0–100.0)
Platelets: 171 10*3/uL (ref 150–400)
RBC: 4.69 MIL/uL (ref 3.87–5.11)
RDW: 15.6 % — ABNORMAL HIGH (ref 11.5–15.5)
WBC: 6.4 10*3/uL (ref 4.0–10.5)

## 2016-11-10 LAB — PROTIME-INR
INR: 1.12
PROTHROMBIN TIME: 14.5 s (ref 11.4–15.2)

## 2016-11-10 LAB — GLUCOSE, CSF: Glucose, CSF: 75 mg/dL — ABNORMAL HIGH (ref 40–70)

## 2016-11-10 LAB — GLUCOSE, CAPILLARY
GLUCOSE-CAPILLARY: 143 mg/dL — AB (ref 65–99)
GLUCOSE-CAPILLARY: 107 mg/dL — AB (ref 65–99)
Glucose-Capillary: 144 mg/dL — ABNORMAL HIGH (ref 65–99)
Glucose-Capillary: 159 mg/dL — ABNORMAL HIGH (ref 65–99)

## 2016-11-10 LAB — PROTEIN, CSF: Total  Protein, CSF: 70 mg/dL — ABNORMAL HIGH (ref 15–45)

## 2016-11-10 LAB — HIV ANTIBODY (ROUTINE TESTING W REFLEX): HIV Screen 4th Generation wRfx: NONREACTIVE

## 2016-11-10 LAB — INFLUENZA PANEL BY PCR (TYPE A & B)
INFLBPCR: NEGATIVE
Influenza A By PCR: NEGATIVE

## 2016-11-10 MED ORDER — DEXTROSE 5 % IV SOLN
10.0000 mg/kg | Freq: Once | INTRAVENOUS | Status: AC
  Administered 2016-11-10: 895 mg via INTRAVENOUS
  Filled 2016-11-10: qty 17.9

## 2016-11-10 MED ORDER — VANCOMYCIN HCL 10 G IV SOLR
2000.0000 mg | Freq: Once | INTRAVENOUS | Status: AC
  Administered 2016-11-10: 2000 mg via INTRAVENOUS
  Filled 2016-11-10: qty 2000

## 2016-11-10 MED ORDER — SODIUM CHLORIDE 0.9% FLUSH
3.0000 mL | Freq: Two times a day (BID) | INTRAVENOUS | Status: DC
  Administered 2016-11-11: 3 mL via INTRAVENOUS

## 2016-11-10 MED ORDER — ONDANSETRON HCL 4 MG PO TABS: 4.0000 mg | ORAL_TABLET | Freq: Four times a day (QID) | ORAL | Status: DC | PRN

## 2016-11-10 MED ORDER — DEXAMETHASONE SODIUM PHOSPHATE 10 MG/ML IJ SOLN
10.0000 mg | Freq: Once | INTRAMUSCULAR | Status: AC
  Administered 2016-11-10: 10 mg via INTRAVENOUS
  Filled 2016-11-10: qty 1

## 2016-11-10 MED ORDER — SULFAMETHOXAZOLE-TRIMETHOPRIM 400-80 MG/5ML IV SOLN
5.0000 mg/kg | Freq: Once | INTRAVENOUS | Status: DC
  Filled 2016-11-10: qty 41

## 2016-11-10 MED ORDER — SODIUM CHLORIDE 0.9 % IV SOLN
2.0000 g | Freq: Three times a day (TID) | INTRAVENOUS | Status: DC
  Administered 2016-11-10 – 2016-11-11 (×4): 2 g via INTRAVENOUS
  Filled 2016-11-10 (×6): qty 2

## 2016-11-10 MED ORDER — INSULIN ASPART 100 UNIT/ML ~~LOC~~ SOLN
0.0000 [IU] | Freq: Three times a day (TID) | SUBCUTANEOUS | Status: DC
  Administered 2016-11-10: 2 [IU] via SUBCUTANEOUS
  Administered 2016-11-10: 3 [IU] via SUBCUTANEOUS

## 2016-11-10 MED ORDER — MELOXICAM 7.5 MG PO TABS
15.0000 mg | ORAL_TABLET | Freq: Every day | ORAL | Status: DC
  Administered 2016-11-11: 15 mg via ORAL
  Filled 2016-11-10 (×2): qty 2

## 2016-11-10 MED ORDER — ACETAMINOPHEN 325 MG PO TABS
650.0000 mg | ORAL_TABLET | Freq: Four times a day (QID) | ORAL | Status: DC | PRN
  Administered 2016-11-10: 650 mg via ORAL
  Filled 2016-11-10: qty 2

## 2016-11-10 MED ORDER — LIDOCAINE HCL (PF) 1 % IJ SOLN
5.0000 mL | Freq: Once | INTRAMUSCULAR | Status: DC
  Filled 2016-11-10: qty 5

## 2016-11-10 MED ORDER — NYSTATIN 100000 UNIT/GM EX OINT
TOPICAL_OINTMENT | Freq: Two times a day (BID) | CUTANEOUS | Status: DC
  Administered 2016-11-10 – 2016-11-11 (×2): via TOPICAL
  Filled 2016-11-10: qty 15

## 2016-11-10 MED ORDER — ACETAMINOPHEN 650 MG RE SUPP: 650.0000 mg | Freq: Four times a day (QID) | RECTAL | Status: DC | PRN

## 2016-11-10 MED ORDER — VANCOMYCIN HCL IN DEXTROSE 1-5 GM/200ML-% IV SOLN: 1000.0000 mg | Freq: Once | INTRAVENOUS | Status: DC

## 2016-11-10 MED ORDER — DIPHENHYDRAMINE HCL 50 MG/ML IJ SOLN
25.0000 mg | Freq: Once | INTRAMUSCULAR | Status: AC
  Administered 2016-11-10: 25 mg via INTRAVENOUS
  Filled 2016-11-10: qty 1

## 2016-11-10 MED ORDER — ONDANSETRON HCL 4 MG/2ML IJ SOLN: 4.0000 mg | Freq: Four times a day (QID) | INTRAMUSCULAR | Status: DC | PRN

## 2016-11-10 MED ORDER — DOXYCYCLINE HYCLATE 100 MG IV SOLR
100.0000 mg | Freq: Two times a day (BID) | INTRAVENOUS | Status: DC
  Administered 2016-11-10 – 2016-11-11 (×3): 100 mg via INTRAVENOUS
  Filled 2016-11-10 (×3): qty 100

## 2016-11-10 MED ORDER — DEXTROSE 5 % IV SOLN
10.0000 mg/kg | Freq: Three times a day (TID) | INTRAVENOUS | Status: AC
  Administered 2016-11-10 – 2016-11-11 (×3): 1360 mg via INTRAVENOUS
  Filled 2016-11-10: qty 27.2
  Filled 2016-11-10: qty 20
  Filled 2016-11-10: qty 27.2

## 2016-11-10 MED ORDER — IBUPROFEN 400 MG PO TABS
600.0000 mg | ORAL_TABLET | Freq: Once | ORAL | Status: AC
  Administered 2016-11-10: 600 mg via ORAL
  Filled 2016-11-10: qty 1

## 2016-11-10 MED ORDER — SODIUM CHLORIDE 0.9 % IV SOLN
INTRAVENOUS | Status: DC
  Administered 2016-11-10 – 2016-11-11 (×4): via INTRAVENOUS

## 2016-11-10 MED ORDER — METOCLOPRAMIDE HCL 5 MG/ML IJ SOLN
10.0000 mg | Freq: Once | INTRAMUSCULAR | Status: AC
  Administered 2016-11-10: 10 mg via INTRAVENOUS
  Filled 2016-11-10: qty 2

## 2016-11-10 MED ORDER — VANCOMYCIN HCL 10 G IV SOLR
1250.0000 mg | Freq: Three times a day (TID) | INTRAVENOUS | Status: DC
  Administered 2016-11-10 – 2016-11-11 (×3): 1250 mg via INTRAVENOUS
  Filled 2016-11-10 (×4): qty 1250

## 2016-11-10 NOTE — Progress Notes (Signed)
Patient ID: Michaela Norris, female   DOB: 1975-12-23, 41 y.o.   MRN: [id number]                                                                PROGRESS NOTE                                                                                                                                                                                                             Patient Demographics:    Michaela Norris, is a 41 y.o. female, DOB - 04-24-1976, RWE:[id number]  Admit date - 11/09/2016   Admitting Physician Lily Kocher, MD  Outpatient Primary MD for the patient is Midge Minium, MD  LOS - 0  Outpatient Specialists:     Chief Complaint  Patient presents with  . Headache  . Fever       Brief Narrative  41 y.o. woman with a history of PCOS, pseudotumor cerebri, OSA (uses CPAP qHS), "borderline" diabetes, and labia rash (biopsy done on Friday) who presents to the ED with her partner for evaluation of fever and headache.  The patient awakened Saturday morning with intense, throbbing headache associated with light sensitivity.  The headache is frontal.  It is worse with movement (any change in position) and pain seems to radiate into her neck and down her spine.  She has had chills but no documented fever until the ER.  No sweats.  She does not have a history of migraine headache.  Origin of labia rash unclear.  It has persisted despite treatment for yeast.  Shingles felt to be unlikely as well but it is on the differential.  No known history of herpes.  She also reports tick exposure on Memorial day.  She removed a small tick embedded in he right hip.  She has not had any new rash over her trunk or extremities.  No known fever until today.  ED Course: Fever to 101.4 in the ED.  Normal WBC count.  Lactic acid level 1.44.  Chest xray negative for acute process.  Head CT pending.  Pregnancy test is negative.  She is refusing bedside LP in the ED (history of complications, post-LP headache).  She  wants the procedure done under fluoro tomorrow.  The patient has received acetaminophen and ibuprofen for  fever.  IV reglan and benadryl for headache.  Empiric coverage discussed with ED attending.  However, I am changing IV Bactrim to Merrem.  Adding acyclovir until CHF HSV can be sent.  Continue vanc.  Adding doxycycline due to reported history to tick exposure.    Subjective:    Michaela Norris today has been feeling better.  Fever broke.  Source of fever reported by pt to 102 ? Unclear.  There is a rash on her left lower back. Pt denies weakness, numbness, tingling.      No headache, No chest pain, No abdominal pain - No Nausea,  No Cough - SOB.    Assessment  & Plan :    Principal Problem:   Meningitis Active Problems:   Pseudotumor cerebri   OSA on CPAP   Abdominal pain, epigastric   Morbid obesity (HCC)   Tick bite   Rash of perineum   Fever Unclear source.  LP pending but I don't clinically think this is meningitis at this time due to neck being so supple Currently on vanco iv, meropenem iv, acyclovir iv Will consult ID Appreciate assistance w abx coverage.   Rash ? Zoster Continue acyclovir  Labial rash reported  Glucose intolerance SSI    Code Status :  FULL CODE  Family Communication  : w patient  Disposition Plan  : home  Barriers For Discharge :   Consults  :  ID     Procedures  : LP pending,  D/w radiology, they will put her on schedule  DVT Prophylaxis  :  SCDs   Lab Results  Component Value Date   PLT 171 11/10/2016    Antibiotics  :  Vanco, 6/17,Meropenem 6/17, acyclovir 6/17  Anti-infectives    Start     Dose/Rate Route Frequency Ordered Stop   11/10/16 1200  vancomycin (VANCOCIN) 1,250 mg in sodium chloride 0.9 % 250 mL IVPB     1,250 mg 166.7 mL/hr over 90 Minutes Intravenous Every 8 hours 11/10/16 0349     11/10/16 0600  meropenem (MERREM) 2 g in sodium chloride 0.9 % 100 mL IVPB     2 g 200 mL/hr over 30 Minutes Intravenous  Every 8 hours 11/10/16 0326     11/10/16 0400  doxycycline (VIBRAMYCIN) 100 mg in dextrose 5 % 250 mL IVPB     100 mg 125 mL/hr over 120 Minutes Intravenous Every 12 hours 11/10/16 0254     11/10/16 0330  vancomycin (VANCOCIN) IVPB 1000 mg/200 mL premix  Status:  Discontinued     1,000 mg 200 mL/hr over 60 Minutes Intravenous  Once 11/10/16 0326 11/10/16 0344   11/10/16 0245  acyclovir (ZOVIRAX) 895 mg in dextrose 5 % 150 mL IVPB     10 mg/kg  89.4 kg (Adjusted) 167.9 mL/hr over 60 Minutes Intravenous  Once 11/10/16 0223 11/10/16 0439   11/10/16 0215  sulfamethoxazole-trimethoprim (BACTRIM) 656 mg in dextrose 5 % 500 mL IVPB  Status:  Discontinued     5 mg/kg  131.1 kg 360.7 mL/hr over 90 Minutes Intravenous  Once 11/10/16 0207 11/10/16 0326   11/10/16 0215  vancomycin (VANCOCIN) IVPB 1000 mg/200 mL premix  Status:  Discontinued     1,000 mg 200 mL/hr over 60 Minutes Intravenous  Once 11/10/16 0207 11/10/16 0210   11/10/16 0215  vancomycin (VANCOCIN) 2,000 mg in sodium chloride 0.9 % 500 mL IVPB     2,000 mg 250 mL/hr over 120 Minutes Intravenous  Once 11/10/16 0210 11/10/16  0541        Objective:   Vitals:   11/10/16 0045 11/10/16 0200 11/10/16 0412 11/10/16 0424  BP:    (!) 103/43  Pulse:    89  Resp:    18  Temp: (!) 100.9 F (38.3 C)   100.3 F (37.9 C)  TempSrc: Oral   Oral  SpO2:    95%  Weight:  131.1 kg (289 lb) (!) 136.2 kg (300 lb 3.2 oz)   Height:  5\' 7"  (1.702 m) 5\' 8"  (1.727 m)     Wt Readings from Last 3 Encounters:  11/10/16 (!) 136.2 kg (300 lb 3.2 oz)  06/19/16 131.2 kg (289 lb 4 oz)  04/09/16 131.1 kg (289 lb)     Intake/Output Summary (Last 24 hours) at 11/10/16 5329 Last data filed at 11/10/16 9242  Gross per 24 hour  Intake          1282.48 ml  Output                0 ml  Net          1282.48 ml     Physical Exam  Awake Alert, Oriented X 3, No new F.N deficits, Normal affect [place].AT,PERRAL Supple Neck,No JVD, No cervical lymphadenopathy  appriciated.  Symmetrical Chest wall movement, Good air movement bilaterally, CTAB RRR,No Gallops,Rubs or new Murmurs, No Parasternal Heave +ve B.Sounds, Abd Soft, No tenderness, No organomegaly appriciated, No rebound - guarding or rigidity. No Cyanosis, Clubbing or edema,  Papular rash on the left lower back.   No janeway, no osler, no splinter    Data Review:    CBC  Recent Labs Lab 11/09/16 2225 11/10/16 0446  WBC 4.9 6.4  HGB 10.7* 10.3*  HCT 35.1* 34.2*  PLT 179 171  MCV 71.9* 72.9*  MCH 21.9* 22.0*  MCHC 30.5 30.1  RDW 15.5 15.6*  LYMPHSABS 1.1  --   MONOABS 0.3  --   EOSABS 0.1  --   BASOSABS 0.0  --     Chemistries   Recent Labs Lab 11/09/16 2225 11/10/16 0446  NA 136 138  K 3.8 3.9  CL 103 107  CO2 26 23  GLUCOSE 123* 142*  BUN 9 10  CREATININE 0.78 0.77  CALCIUM 8.8* 8.8*  AST 22  --   ALT 23  --   ALKPHOS 70  --   BILITOT 1.0  --    ------------------------------------------------------------------------------------------------------------------ No results for input(s): CHOL, HDL, LDLCALC, TRIG, CHOLHDL, LDLDIRECT in the last 72 hours.  Lab Results  Component Value Date   HGBA1C 5.5 06/19/2016   ------------------------------------------------------------------------------------------------------------------ No results for input(s): TSH, T4TOTAL, T3FREE, THYROIDAB in the last 72 hours.  Invalid input(s): FREET3 ------------------------------------------------------------------------------------------------------------------ No results for input(s): VITAMINB12, FOLATE, FERRITIN, TIBC, IRON, RETICCTPCT in the last 72 hours.  Coagulation profile  Recent Labs Lab 11/10/16 0446  INR 1.12    No results for input(s): DDIMER in the last 72 hours.  Cardiac Enzymes No results for input(s): CKMB, TROPONINI, MYOGLOBIN in the last 168 hours.  Invalid input(s):  CK ------------------------------------------------------------------------------------------------------------------ No results found for: BNP  Inpatient Medications  Scheduled Meds: . acetaminophen      . insulin aspart  0-15 Units Subcutaneous TID WC  . lidocaine (PF)  5 mL Intradermal Once  . meloxicam  15 mg Oral Daily  . sodium chloride flush  3 mL Intravenous Q12H   Continuous Infusions: . sodium chloride 125 mL/hr at 11/10/16 0427  . doxycycline (VIBRAMYCIN) IV Stopped (  11/10/16 0540)  . meropenem (MERREM) IV Stopped (11/10/16 5456)  . vancomycin     PRN Meds:.acetaminophen **OR** acetaminophen, ondansetron **OR** ondansetron (ZOFRAN) IV  Micro Results No results found for this or any previous visit (from the past 240 hour(s)).  Radiology Reports Dg Chest 2 View  Result Date: 11/10/2016 CLINICAL DATA:  Fever and headache EXAM: CHEST  2 VIEW COMPARISON:  Chest radiograph 03/19/2009 FINDINGS: Cardiomegaly is unchanged. No pleural effusion or pneumothorax. No focal airspace consolidation or pulmonary edema. IMPRESSION: Unchanged cardiomegaly without acute cardiopulmonary process. Electronically Signed   By: Ulyses Jarred M.D.   On: 11/10/2016 00:36   Ct Head Wo Contrast  Result Date: 11/10/2016 CLINICAL DATA:  Acute onset of headache and fever. Initial encounter. EXAM: CT HEAD WITHOUT CONTRAST TECHNIQUE: Contiguous axial images were obtained from the base of the skull through the vertex without intravenous contrast. COMPARISON:  None. FINDINGS: Brain: No evidence of acute infarction, hemorrhage, hydrocephalus, extra-axial collection or mass lesion/mass effect. The posterior fossa, including the cerebellum, brainstem and fourth ventricle, is within normal limits. The third and lateral ventricles, and basal ganglia are unremarkable in appearance. The cerebral hemispheres are symmetric in appearance, with normal gray-white differentiation. No mass effect or midline shift is seen.  Vascular: No hyperdense vessel or unexpected calcification. Skull: There is no evidence of fracture; visualized osseous structures are unremarkable in appearance. Sinuses/Orbits: The orbits are within normal limits. The paranasal sinuses and mastoid air cells are well-aerated. Other: No significant soft tissue abnormalities are seen. IMPRESSION: Unremarkable noncontrast CT of the head. Electronically Signed   By: Garald Balding M.D.   On: 11/10/2016 04:06    Time Spent in minutes  30   Jani Gravel M.D on 11/10/2016 at 7:02 AM  Between 7am to 7pm - Pager - [phone]  After 7pm go to [email or web address] - password [facility]  [facility] -  Office  [phone]

## 2016-11-10 NOTE — Consult Note (Signed)
[facility]       Reason for Consult: meningitis    Referring Physician: Dr. Maudie Mercury  Principal Problem:   Meningitis Active Problems:   Pseudotumor cerebri   OSA on CPAP   Abdominal pain, epigastric   Morbid obesity ([place])   Tick bite   Rash of perineum   . insulin aspart  0-15 Units Subcutaneous TID WC  . lidocaine (PF)  5 mL Intradermal Once  . meloxicam  15 mg Oral Daily  . sodium chloride flush  3 mL Intravenous Q12H    Recommendations: Continue doxycycline with possibility of RMSF I will stop acyclovir, not c/w encephalitis  I will stop vancomycin, meropenem, studies c/w viral etiology Continue supportive care, NSAIDS  Routine HIV screening  Assessment: She has headache, fever, normal WBC, CSF studies with 384 WBCs with a lymphocytic predominance, elevated glucose and protein mildly elevated to 70  C/w viral meningitis.  No confusion other than tiredness associated with fever and headache so not c/w encephalitis.  Headache description meningeal.   Isolation - not c/w meningicoccal meningitis with normal glucose, negative gram stain and low severity of illness so will d/c isolation.    Antibiotics: Vancomycin, meropenem, acyclovir, doxy  HPI: Michaela Norris is a 41 y.o. female with PCOS, OSA and pseudotumor cerebri who had a new onset of headache and fever yesterday am.  She described headache as intense, throbbing and associated with photophobia.  Significant pain with head flexion.  Headache over top and front.  Tmax 101.4.  Feels better now since admission. CT without anything acute.  Did have a tick bite recently.  CSF WBCs up and studies as above.  Works in a homeless shelter, around [employer], dispensing. No known sick contacts, no recent travel.   Review of Systems:  Constitutional: positive for fevers and malaise or negative for anorexia and weight loss Respiratory: negative for cough Gastrointestinal: negative for  diarrhea Integument/breast: positive for rash, negative for pruritus All other systems reviewed and are negative    Past Medical History:  Diagnosis Date  . Anxiety   . Depression   . Diabetes mellitus    notes borderline  . Headache(784.0)   . High blood pressure    off meds at this time  . Hyperlipidemia   . Mania ([place])   . Obesity   . OSA on CPAP 03/18/2013  . Psychosis   . PTSD (post-traumatic stress disorder)   . Schizoaffective disorder     Social History  Substance Use Topics  . Smoking status: Never Smoker  . Smokeless tobacco: Never Used  . Alcohol use No    Family History  Problem Relation Age of Onset  . Alcohol abuse Mother   . Depression Mother   . Alcohol abuse Brother   . Suicidality Maternal Grandmother   . Anorexia nervosa Maternal Grandmother   . Asperger's syndrome Daughter   . Schizophrenia Father   . Brain cancer Father   . Early death Father     Allergies  Allergen Reactions  . Latex Itching and Rash  . Penicillins Swelling    Swelling in hands; states she still takes it as needed  . Tegretol [Carbamazepine] Rash  . Vicodin [Hydrocodone-Acetaminophen] Rash    Physical Exam: Constitutional: in no apparent distress and alert  Vitals:   11/10/16 0424 11/10/16 1203  BP: (!) 103/43 128/68  Pulse: 89 76  Resp: 18 20  Temp: 100.3 F (37.9 C) 97.9 F (36.6 C)  EYES: anicteric ENMT: no thrush Cardiovascular: Cor RRR Respiratory: CTA B, anterior exam; normal respiratory effort GI: Bowel sounds are normal, liver is not enlarged, spleen is not enlarged, soft Musculoskeletal: no pedal edema noted Neuro: + neck flexion discomfort  Lab Results  Component Value Date   WBC 6.4 11/10/2016   HGB 10.3 (L) 11/10/2016   HCT 34.2 (L) 11/10/2016   MCV 72.9 (L) 11/10/2016   PLT 171 11/10/2016    Lab Results  Component Value Date   CREATININE 0.77 11/10/2016   BUN 10 11/10/2016   NA 138 11/10/2016   K 3.9 11/10/2016   CL 107 11/10/2016    CO2 23 11/10/2016    Lab Results  Component Value Date   ALT 23 11/09/2016   AST 22 11/09/2016   ALKPHOS 70 11/09/2016     Microbiology: Recent Results (from the past 240 hour(s))  CSF culture with Stat gram stain     Status: None (Preliminary result)   Collection Time: 11/10/16 10:04 AM  Result Value Ref Range Status   Specimen Description CSF  Final   Special Requests NONE  Final   Gram Stain   Final    WBC PRESENT, PREDOMINANTLY MONONUCLEAR NO ORGANISMS SEEN CYTOSPIN SMEAR    Culture PENDING  Incomplete   Report Status PENDING  Incomplete    Scharlene Gloss, [place] [facility] [email or web address] [phone] pager  [phone] cell 11/10/2016, 3:49 PM

## 2016-11-10 NOTE — Progress Notes (Signed)
Patient ID: Michaela Norris, female   DOB: 1976/03/13, 41 y.o.   MRN: [id number]  LUMBAR PUNCTURE  LP performed at L4-5 with fluoro guidance.  OP 15 cm H2O. 13 cc clear colorless fluid collected for labs.   Tolerated procedure without apparent complications.

## 2016-11-10 NOTE — ED Provider Notes (Signed)
By signing my name below, I, Marcello Moores, attest that this documentation has been prepared under the direction and in the presence of Rosena Bartle, Annie Main, MD. Electronically Signed: Marcello Moores, ED Scribe. 11/10/16. 1:06 AM.  HPI Comments: Michaela Norris is a 41 y.o. female who presents to the Emergency Department complaining of gradual onset, gradually worsening radiating HA that began 3 days ago with associated fever that began today. The pt also notes a rash near vulva and lower back that was biopsied yesterday. She also states that her current headache feels similar to her monthly HAs that accompany her period, she took her iron supplements on Thursday 11/07/2016 as normal, that provided mild relief. Change in position exacerbates her pain. The pt has a PMHx of sleep apnea (with c-pap) and pseudotumor. She works at a free-[employer]. The pt denies sore throat, dysuria, abdominal pain, chest pain, sore throat, weakness and vision disturbances.  ROS: 10 Systems reviewed and are negative for acute change except as noted in the HPI.   PE:  Non-toxic  Pain with neck flexion Lungs clear bilaterally Erythematous papular rash involving left buttock perianal and vulvar area present.  Patient with fever and headache. She does have pain with neck flexion but no frank meningismus. No leukocytosis. Her neurological exam is normal.  Patient does have a rash that extends up her buttocks towards her lumbar spine. This may complicate lumbar puncture.  Risks and benefits of lumbar puncture discussed with patient and concern for possible meningitis given her headache and fever and pain with neck flexion. She states she does not want this procedure unless done with x-ray guidance. She understands that meningitis can be life-threatening and should be diagnosed as soon as possible.  We'll start antibiotics and plan admission.  Patient has a penicillin allergy. We'll give Bactrim and vancomycin. Also  will give acyclovir given patient's rash which is possibly herpetic in origin.  Admission discussed with Dr. Eulas Post.  I personally performed the services described in this documentation, which was scribed in my presence. The recorded information has been reviewed and is accurate.     Ezequiel Essex, MD 11/10/16 [phone]

## 2016-11-10 NOTE — ED Notes (Signed)
Patient transported to X-ray 

## 2016-11-10 NOTE — H&P (Signed)
History and Physical    Michaela Norris DGU:[id number] DOB: 09/26/1975 DOA: 11/09/2016  PCP: Midge Minium, MD   Patient coming from: Home  Chief Complaint: Headache and fever  HPI: Michaela Norris is a 41 y.o. woman with a history of PCOS, pseudotumor cerebri, OSA (uses CPAP qHS), "borderline" diabetes, and labia rash (biopsy done on Friday) who presents to the ED with her partner for evaluation of fever and headache.  The patient awakened Saturday morning with intense, throbbing headache associated with light sensitivity.  The headache is frontal.  It is worse with movement (any change in position) and pain seems to radiate into her neck and down her spine.  She has had chills but no documented fever until the ER.  No sweats.  She does not have a history of migraine headache.  Origin of labia rash unclear.  It has persisted despite treatment for yeast.  Shingles felt to be unlikely as well but it is on the differential.  No known history of herpes.  She also reports tick exposure on Memorial day.  She removed a small tick embedded in he right hip.  She has not had any new rash over her trunk or extremities.  No known fever until today.  ED Course: Fever to 101.4 in the ED.  Normal WBC count.  Lactic acid level 1.44.  Chest xray negative for acute process.  Head CT pending.  Pregnancy test is negative.  She is refusing bedside LP in the ED (history of complications, post-LP headache).  She wants the procedure done under fluoro tomorrow.  The patient has received acetaminophen and ibuprofen for fever.  IV reglan and benadryl for headache.  Empiric coverage discussed with ED attending.  However, I am changing IV Bactrim to Merrem.  Adding acyclovir until CHF HSV can be sent.  Continue vanc.  Adding doxycycline due to reported history to tick exposure.  Review of Systems: As per HPI otherwise 10 systems reviewed and negative.   Past Medical History:  Diagnosis Date  .  Anxiety   . Depression   . Diabetes mellitus    notes borderline  . Headache(784.0)   . High blood pressure    off meds at this time  . Hyperlipidemia   . Mania ([place])   . Obesity   . OSA on CPAP 03/18/2013  . Psychosis   . PTSD (post-traumatic stress disorder)   . Schizoaffective disorder     Past Surgical History:  Procedure Laterality Date  . broken ankle  2000  . CHOLECYSTECTOMY    . FRACTURE SURGERY       reports that she has never smoked. She has never used smokeless tobacco. She reports that she does not drink alcohol or use drugs.  She has one adult daughter.  Allergies  Allergen Reactions  . Latex Itching and Rash  . Penicillins Swelling    Swelling in hands; states she still takes it as needed  . Tegretol [Carbamazepine] Rash  . Vicodin [Hydrocodone-Acetaminophen] Rash    Family History  Problem Relation Age of Onset  . Alcohol abuse Mother   . Depression Mother   . Alcohol abuse Brother   . Suicidality Maternal Grandmother   . Anorexia nervosa Maternal Grandmother   . Asperger's syndrome Daughter   . Schizophrenia Father   . Brain cancer Father   . Early death Father      Prior to Admission medications   Medication Sig Start Date End Date Taking? Authorizing Provider  aspirin-acetaminophen-caffeine (EXCEDRIN MIGRAINE) 250-250-65 MG tablet Take 1 tablet by mouth every 6 (six) hours as needed for headache.   Yes [provider]  cyclobenzaprine (FLEXERIL) 10 MG tablet Take 1 tablet (10 mg total) by mouth 3 (three) times daily as needed for muscle spasms. 10/22/16  Yes Lysbeth Penner, FNP  meloxicam (MOBIC) 15 MG tablet Take 1 tablet (15 mg total) by mouth daily. 06/19/16  Yes Midge Minium, MD  metFORMIN (GLUCOPHAGE XR) 500 MG 24 hr tablet Take 1 tablet (500 mg total) by mouth daily with breakfast. 06/19/16 06/19/17 Yes Midge Minium, MD  valACYclovir (VALTREX) 500 MG tablet Take 500 mg by mouth 2 (two) times daily. 11/08/16  Yes  [provider]    Physical Exam: Vitals:   11/10/16 0015 11/10/16 0030 11/10/16 0045 11/10/16 0200  BP: 124/72 111/66    Pulse: 88 93    Resp:      Temp:   (!) 100.9 F (38.3 C)   TempSrc:   Oral   SpO2: 98% 99%    Weight:    131.1 kg (289 lb)  Height:    5\' 7"  (1.702 m)      Constitutional: NAD, calm but complaining of sensitivity to light. Vitals:   11/10/16 0015 11/10/16 0030 11/10/16 0045 11/10/16 0200  BP: 124/72 111/66    Pulse: 88 93    Resp:      Temp:   (!) 100.9 F (38.3 C)   TempSrc:   Oral   SpO2: 98% 99%    Weight:    131.1 kg (289 lb)  Height:    5\' 7"  (1.702 m)   Eyes: PERRL, lids and conjunctivae normal ENMT: Mucous membranes are moist. Posterior pharynx clear of any exudate or lesions. Normal dentition.  Neck: normal appearance, supple, no masses, ROM limited by pain though I would not call it rigid Respiratory: clear to auscultation bilaterally, no wheezing, no crackles. Normal respiratory effort. No accessory muscle use.  Cardiovascular: Normal rate, regular rhythm, no murmurs / rubs / gallops. No extremity edema. 2+ pedal pulses. GI: abdomen is obese but soft and compressible.  No distention.  No tenderness.  Bowel sounds are present. Musculoskeletal:  No joint deformity in upper and lower extremities. Good ROM, no contractures. Normal muscle tone.  Skin: Rash to vaginal area/perineum is maculopapular in some areas but vesicular in others.  No target lesions.  Skin is warm and dry.   Neurologic: No apparent focal deficits. Psychiatric: Normal judgment and insight. Alert and oriented x 3. Normal mood.     Labs on Admission: I have personally reviewed following labs and imaging studies  CBC:  Recent Labs Lab 11/09/16 2225  WBC 4.9  NEUTROABS 3.4  HGB 10.7*  HCT 35.1*  MCV 71.9*  PLT 299   Basic Metabolic Panel:  Recent Labs Lab 11/09/16 2225  NA 136  K 3.8  CL 103  CO2 26  GLUCOSE 123*  BUN 9  CREATININE 0.78  CALCIUM  8.8*   GFR: Estimated Creatinine Clearance: 131.9 mL/min (by C-G formula based on SCr of 0.78 mg/dL). Liver Function Tests:  Recent Labs Lab 11/09/16 2225  AST 22  ALT 23  ALKPHOS 70  BILITOT 1.0  PROT 7.2  ALBUMIN 3.5   Urine analysis:    Component Value Date/Time   COLORURINE YELLOW 11/10/2016 0100   APPEARANCEUR CLEAR 11/10/2016 0100   LABSPEC 1.013 11/10/2016 0100   PHURINE 8.0 11/10/2016 0100   GLUCOSEU NEGATIVE 11/10/2016 0100  HGBUR MODERATE (A) 11/10/2016 0100   BILIRUBINUR NEGATIVE 11/10/2016 0100   BILIRUBINUR negative 04/09/2016 1557   KETONESUR NEGATIVE 11/10/2016 0100   PROTEINUR NEGATIVE 11/10/2016 0100   UROBILINOGEN 0.2 04/09/2016 1557   NITRITE NEGATIVE 11/10/2016 0100   LEUKOCYTESUR NEGATIVE 11/10/2016 0100   Sepsis Labs:  Lactic acid level 1.44  Radiological Exams on Admission: Dg Chest 2 View  Result Date: 11/10/2016 CLINICAL DATA:  Fever and headache EXAM: CHEST  2 VIEW COMPARISON:  Chest radiograph 03/19/2009 FINDINGS: Cardiomegaly is unchanged. No pleural effusion or pneumothorax. No focal airspace consolidation or pulmonary edema. IMPRESSION: Unchanged cardiomegaly without acute cardiopulmonary process. Electronically Signed   By: Ulyses Jarred M.D.   On: 11/10/2016 00:36    Assessment/Plan Principal Problem:   Meningitis Active Problems:   Pseudotumor cerebri   OSA on CPAP   Abdominal pain, epigastric   Morbid obesity (HCC)   Tick bite   Rash of perineum      Fever with headache concerning for meningitis, patient declined bedside LP in the ED --Empiric coverage with vanc, merrem, doxycycline, and acyclovir as outlined above --LP under fluoro in the AM (will likely need to confirm with IR); preliminary orders for CSF testing given, including HSV PCR --Known tick exposure on Memorial day; low index of suspicion for tick borne illness at this time.  Will start with antibody titers in the serum.  If positive, can add specific testing to  CSF. --Acetaminophen/ibuprofen as needed for fever --NPO now since it is already past midnight  OSA on CPAP --Continue home settings  Labia rash, biopsy pending --Hold valtrex while covering with IV acyclovir  "borderline" DM --Hold metformin --SSI coverage AC when she has a diet  DVT prophylaxis: SCDs Code Status: FULL Family Communication: Partner at bedside in the ED at time of admission. Disposition Plan: Expect she will go home when ready for discharge. Consults called: NONE Admission status: Place in observation with telemetry monitoring.   TIME SPENT: 60 minutes   Eber Sopp MD [facility] Pager [phone]  If 7PM-7AM, please contact night-coverage [email or web address] Password [email or web address]  11/10/2016, 3:26 AM

## 2016-11-10 NOTE — ED Notes (Signed)
In room to scan lidocaine.  Family member states she doesn't need that because she isn't doing that test.  Requesting water.

## 2016-11-10 NOTE — ED Notes (Signed)
Patient transported to CT 

## 2016-11-10 NOTE — Progress Notes (Addendum)
Iv infiltrated 2 attempts 22G in the left Forearm.

## 2016-11-10 NOTE — ED Provider Notes (Signed)
[facility] DEPT Provider Note   CSN: [id number] Arrival date & time: 11/09/16  2134     History   Chief Complaint Chief Complaint  Patient presents with  . Headache  . Fever    HPI Michaela Norris is a 41 y.o. female.  Patient is a 41 year old female who presents to the emergency department with a complaint of fever and headache.  The patient states on June 8 she began to have increase in a rash in the vaginal area. She has had this rash at times in the past, but this one was more severe. She been treated for possible yeast infection. On this occasion her physician told that this was not a yeast infection. She took a biopsy on yesterday 6/15 and sent to the lab. She said that it may require treatment with an antiviral medication and the patient was placed on Valtrex. Today the patient has a headache. Around 7 PM she noticed that she had chills and fever. Her temperature maximum was 102.4. The patient describes the headache as starting in the frontal area going to the back of the head and then down on the neck toward the back. She states that when she swallows or when she sits up but she gets a "throbbing type sensation". She says this is similar to some headaches that she's had with low iron, or a heavy menstrual cycle. She states that she is on her menstrual cycle at this time and it has been heavier than usual. She is on progesterone to help with this but states this is not helping at the moment. The patient denies any recent sore throat. There's been some mild nasal congestion present. His been no cough, no vomiting, no diarrhea. The patient states that she is taking iron and is battling with some constipation. The patient has a rash in the vaginal area but no other rash is appreciated. 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      Past Medical History:  Diagnosis Date  . Anxiety   . Depression   .  Diabetes mellitus    notes borderline  . Headache(784.0)   . High blood pressure    off meds at this time  . Hyperlipidemia   . Mania ([place])   . Obesity   . OSA on CPAP 03/18/2013  . Psychosis   . PTSD (post-traumatic stress disorder)   . Schizoaffective disorder     Patient Active Problem List   Diagnosis Date Noted  . PCOS (polycystic ovarian syndrome) 06/19/2016  . Morbid obesity ([place]) 06/19/2016  . Abdominal pain, epigastric 11/03/2014  . OSA on CPAP 03/18/2013  . BPPV (benign paroxysmal positional vertigo) 03/08/2013  . Abdominal gas pain 11/05/2012  . Asperger's disorder 10/07/2012  . Physical exam 02/26/2012  . Schizoaffective disorder ([place]) 12/25/2011  . Anemia 12/25/2011  . Hyperlipidemia 12/25/2011  . Pseudotumor cerebri 12/25/2011  . Bipolar 1 disorder, depressed ([place]) 08/15/2011    Past Surgical History:  Procedure Laterality Date  . broken ankle  2000  . CHOLECYSTECTOMY    . FRACTURE SURGERY      OB History    No data available       Home Medications    Prior to Admission medications   Medication Sig Start Date End Date Taking? Authorizing Provider  cyclobenzaprine (FLEXERIL) 10 MG tablet Take 1 tablet (10 mg total) by mouth 3 (three) times daily as needed  for muscle spasms. 06/19/16   Midge Minium, MD  cyclobenzaprine (FLEXERIL) 10 MG tablet Take 1 tablet (10 mg total) by mouth 3 (three) times daily as needed for muscle spasms. 10/22/16   Lysbeth Penner, FNP  meloxicam (MOBIC) 15 MG tablet Take 1 tablet (15 mg total) by mouth daily. 06/19/16   Midge Minium, MD  metFORMIN (GLUCOPHAGE XR) 500 MG 24 hr tablet Take 1 tablet (500 mg total) by mouth daily with breakfast. 06/19/16 06/19/17  Midge Minium, MD  methylPREDNISolone (MEDROL DOSEPAK) 4 MG TBPK tablet Take 6-5-4-3-2-1 po qd 10/22/16   Lysbeth Penner, FNP  NORLYDA 0.35 MG tablet  06/15/16   [provider]  omeprazole (PRILOSEC) 20 MG capsule Take 1 capsule (20 mg total)  by mouth daily. 10/22/16   Lysbeth Penner, FNP    Family History Family History  Problem Relation Age of Onset  . Alcohol abuse Mother   . Depression Mother   . Alcohol abuse Brother   . Suicidality Maternal Grandmother   . Anorexia nervosa Maternal Grandmother   . Asperger's syndrome Daughter   . Schizophrenia Father   . Brain cancer Father   . Early death Father     Social History Social History  Substance Use Topics  . Smoking status: Never Smoker  . Smokeless tobacco: Never Used  . Alcohol use No     Allergies   Latex; Penicillins; Tegretol [carbamazepine]; and Vicodin [hydrocodone-acetaminophen]   Review of Systems Review of Systems  Constitutional: Positive for activity change, chills, fatigue and fever.  HENT: Positive for congestion. Negative for sore throat and voice change.   Respiratory: Negative for cough, shortness of breath and wheezing.   Cardiovascular: Negative for chest pain, palpitations and leg swelling.  Gastrointestinal: Positive for constipation. Negative for abdominal pain, blood in stool, diarrhea and vomiting.  Genitourinary: Positive for genital sores.  Musculoskeletal: Positive for myalgias.  Skin: Negative for rash.  Neurological: Positive for headaches.     Physical Exam Updated Vital Signs BP 111/66   Pulse 93   Temp (!) 100.9 F (38.3 C) (Oral)   Resp 18   LMP 11/09/2016   SpO2 99%   Physical Exam  Constitutional: Vital signs are normal. She appears well-developed and well-nourished. She is active.  HENT:  Head: Normocephalic and atraumatic.  Right Ear: Tympanic membrane, external ear and ear canal normal.  Left Ear: Tympanic membrane, external ear and ear canal normal.  Nose: Nose normal.  Mouth/Throat: Uvula is midline, oropharynx is clear and moist and mucous membranes are normal.  Eyes: Conjunctivae, EOM and lids are normal. Pupils are equal, round, and reactive to light.  Neck: Trachea normal, normal range of motion  and phonation normal. Neck supple. Carotid bruit is not present.  Cardiovascular: Normal rate, regular rhythm and normal pulses.   Abdominal: Soft. Normal appearance and bowel sounds are normal. There is no tenderness. There is no guarding.  Musculoskeletal:  No hot areas noted about the spine. No tenderness to palpation of the spine. No palpable step-offs appreciated.  There is good range of motion of the neck and all extremities. There is some pain with chin to chest movement. There no hot joints appreciated.  Lymphadenopathy:       Head (right side): No submental, no preauricular and no posterior auricular adenopathy present.       Head (left side): No submental, no preauricular and no posterior auricular adenopathy present.    She has no cervical adenopathy.  Neurological: She is alert. She has normal strength. No cranial nerve deficit or sensory deficit. Coordination normal. GCS eye subscore is 4. GCS verbal subscore is 5. GCS motor subscore is 6.  Skin: Skin is warm and dry. No rash noted.  Psychiatric: Her speech is normal.     ED Treatments / Results  Labs (all labs ordered are listed, but only abnormal results are displayed) Labs Reviewed  COMPREHENSIVE METABOLIC PANEL - Abnormal; Notable for the following:       Result Value   Glucose, Bld 123 (*)    Calcium 8.8 (*)    All other components within normal limits  CBC WITH DIFFERENTIAL/PLATELET - Abnormal; Notable for the following:    Hemoglobin 10.7 (*)    HCT 35.1 (*)    MCV 71.9 (*)    MCH 21.9 (*)    All other components within normal limits  URINALYSIS, ROUTINE W REFLEX MICROSCOPIC  I-STAT CG4 LACTIC ACID, ED  I-STAT BETA HCG BLOOD, ED ([facility], [facility], [facility] ONLY)  I-STAT CG4 LACTIC ACID, ED    EKG  EKG Interpretation None       Radiology Dg Chest 2 View  Result Date: 11/10/2016 CLINICAL DATA:  Fever and headache EXAM: CHEST  2 VIEW COMPARISON:  Chest radiograph 03/19/2009 FINDINGS: Cardiomegaly is unchanged. No  pleural effusion or pneumothorax. No focal airspace consolidation or pulmonary edema. IMPRESSION: Unchanged cardiomegaly without acute cardiopulmonary process. Electronically Signed   By: Ulyses Jarred M.D.   On: 11/10/2016 00:36    Procedures Procedures (including critical care time)  Medications Ordered in ED Medications  acetaminophen (TYLENOL) 325 MG tablet (not administered)  acetaminophen (TYLENOL) tablet 650 mg (650 mg Oral Given 11/09/16 2210)     Initial Impression / Assessment and Plan / ED Course  I have reviewed the triage vital signs and the nursing notes.  Pertinent labs & imaging results that were available during my care of the patient were reviewed by me and considered in my medical decision making (see chart for details).       Final Clinical Impressions(s) / ED Diagnoses MDM Temperature improving after Tylenol. The white blood cell count is normal at 4900. Hemoglobin is slightly low at 10.7, hematocrit slightly low at 35.1. There is no shift to the left. Comprehensive metabolic panel is nonacute. The anion gap is normal at 7. Lactic acid is normal at 1.44. Chest x-ray shows no acute cardiopulmonary process.  Patient seen with me by Dr Wyvonnia Dusky. Second lactic acid pending. We'll also assess for need for lumbar puncture. Urinalysis is pending.  Pt care to be continued by Dr Wyvonnia Dusky.   Final diagnoses:  Bad headache  Fever, unspecified fever cause    New Prescriptions New Prescriptions   No medications on file     Annette Stable 11/11/16 0825    Ezequiel Essex, MD 11/11/16 [phone]

## 2016-11-10 NOTE — Progress Notes (Signed)
CRITICAL VALUE ALERT  Critical Value:   WBC, CSF 384 (HH   Date & Time Notied: 11/10/16  Provider Notified: Dr Maudie Mercury Via Text   Orders Received/Actions taken: Pending

## 2016-11-10 NOTE — Progress Notes (Signed)
Pharmacy Antibiotic Note  Michaela Norris is a 41 y.o. female admitted on 11/09/2016 with concern for meningitis.  Pharmacy has been consulted for vancomycin dosing.  Plan: Vancomycin 2000mg  x1 then 1250mg  IV every 8 hours.  Goal trough 15-20 mcg/mL.  Height: 5\' 7"  (170.2 cm) Weight: 289 lb (131.1 kg) IBW/kg (Calculated) : 61.6  Temp (24hrs), Avg:101.2 F (38.4 C), Min:100.9 F (38.3 C), Max:101.4 F (38.6 C)   Recent Labs Lab 11/09/16 2225 11/09/16 2242  WBC 4.9  --   CREATININE 0.78  --   LATICACIDVEN  --  1.44    Estimated Creatinine Clearance: 131.9 mL/min (by C-G formula based on SCr of 0.78 mg/dL).    Allergies  Allergen Reactions  . Latex Itching and Rash  . Penicillins Swelling    Swelling in hands; states she still takes it as needed  . Tegretol [Carbamazepine] Rash  . Vicodin [Hydrocodone-Acetaminophen] Rash     Thank you for allowing pharmacy to be a part of this patient's care.  Wynona Neat, PharmD, BCPS  11/10/2016 3:43 AM

## 2016-11-10 NOTE — ED Notes (Signed)
Patient denies pain and is resting comfortably.  

## 2016-11-11 DIAGNOSIS — G039 Meningitis, unspecified: Secondary | ICD-10-CM

## 2016-11-11 LAB — HERPES SIMPLEX VIRUS(HSV) DNA BY PCR
HSV 1 DNA: NEGATIVE
HSV 2 DNA: NEGATIVE

## 2016-11-11 LAB — GLUCOSE, CAPILLARY: GLUCOSE-CAPILLARY: 95 mg/dL (ref 65–99)

## 2016-11-11 LAB — B. BURGDORFI ANTIBODIES

## 2016-11-11 MED ORDER — DOXYCYCLINE HYCLATE 100 MG PO TABS: 100.0000 mg | ORAL_TABLET | Freq: Two times a day (BID) | ORAL | 0 refills | Status: DC

## 2016-11-11 MED ORDER — DOXYCYCLINE HYCLATE 100 MG PO TABS
100.0000 mg | ORAL_TABLET | Freq: Two times a day (BID) | ORAL | Status: DC
  Administered 2016-11-11: 100 mg via ORAL
  Filled 2016-11-11: qty 1

## 2016-11-11 NOTE — Progress Notes (Signed)
Pt has no distress or pain thought the night, Starting PO Antibiotics and Poss Discharge later.

## 2016-11-11 NOTE — Progress Notes (Signed)
    [facility] for Infectious Disease    Date of Admission:  11/09/2016   Total days of antibiotics 3        Day 3 doxy           ID: Michaela Norris is a 41 y.o. female with aseptic meningitis, lymphocytic predominance though reports having recent history of tick bite Principal Problem:   Meningitis Active Problems:   Pseudotumor cerebri   OSA on CPAP   Abdominal pain, epigastric   Morbid obesity (HCC)   Tick bite   Rash of perineum   Aseptic meningitis = CSF cx at 24hr ngtd.looks improved. hsv pending  Hx of tick bite = continue on doxy x 7 day. Tick panel still pending  [name] Infectious [facility] Cell: [phone] Pager: [phone]  11/11/2016, 10:06 AM

## 2016-11-11 NOTE — Discharge Summary (Signed)
Michaela Norris, is a 41 y.o. female  DOB 1976-05-13  MRN [id number].  Admission date:  11/09/2016  Admitting Physician  Lily Kocher, MD  Discharge Date:  11/11/2016   Primary MD  Midge Minium, MD  Recommendations for primary care physician for things to follow:   Headache improved s/p LP 6/17 => Viral meningitis   Rash Cont valtrex  Fever resolved ID consulted pcp to follow up on rmsf titer as well as lyme titer Continue doxycycline 100mg  po bid for now  Rash ? Zoster Continue acyclovir  Glucose intolerance Resume metformin    Admission Diagnosis  Bad headache [R51] Headache [R51] Fever, unspecified fever cause [R50.9]   Discharge Diagnosis  Bad headache [R51] Headache [R51] Fever, unspecified fever cause [R50.9]    Principal Problem:   Meningitis Active Problems:   Pseudotumor cerebri   OSA on CPAP   Abdominal pain, epigastric   Morbid obesity (HCC)   Tick bite   Rash of perineum      Past Medical History:  Diagnosis Date  . Anxiety   . Depression   . Diabetes mellitus    notes borderline  . Headache(784.0)   . High blood pressure    off meds at this time  . Hyperlipidemia   . Mania ([place])   . Obesity   . OSA on CPAP 03/18/2013  . Psychosis   . PTSD (post-traumatic stress disorder)   . Schizoaffective disorder     Past Surgical History:  Procedure Laterality Date  . broken ankle  2000  . CHOLECYSTECTOMY    . FRACTURE SURGERY         HPI  from Michaela history and physical done on [facility] day of admission:      41 y.o.woman with a history of PCOS, pseudotumor cerebri, OSA (uses CPAP qHS), "borderline" diabetes, and labia rash (biopsy done on Friday) who presents to [facility] ED with her partner for evaluation of fever and headache. Michaela Norris awakened Saturday morning with intense, throbbing headache associated with light sensitivity. Michaela  headache is frontal. It is worse with movement (any change in position) and pain seems to radiate into her neck and down her spine. She has had chills but no documented fever until [facility] ER. No sweats. She does not have a history of migraine headache.  Origin of labia rash unclear. It has persisted despite treatment for yeast. Shingles felt to be unlikely as well but it is on Michaela differential. No known history of herpes.  She also reports tick exposure on Memorial day. She removed a small tick embedded in he right hip. She has not had any new rash over her trunk or extremities. No known fever until today.  ED Course:Fever to 101.4 in [facility] ED. Normal WBC count. Lactic acid level 1.44. Chest xray negative for acute process. Head CT pending. Pregnancy test is negative. She is refusing bedside LP in [facility] ED (history of complications, post-LP headache). She wants Michaela procedure done under fluoro tomorrow.  Michaela  Norris has received acetaminophen and ibuprofen for fever. IV reglan and benadryl for headache. Empiric coverage discussed with ED attending. However, I am changing IV Bactrim to Merrem. Adding acyclovir until CHF HSV can be sent. Continue vanc. Adding doxycycline due to reported history to tick exposure.    Hospital Course:        Pt admitted and started on empiriic vanco iv and meropenem iv.  LP showed wbc 384 predominantly lymphocytic, suggestive of viral meningitis.  Culture pending, gram stain negative. Pt was seen by ID who recommended continuation of doxycycline til RMSF titer is back.  Pt has been afebrile and feels stable and will be discharge home today.    Follow UP  Follow-up Information    Midge Minium, MD Follow up in 1 week(s).   Specialty:  Family Medicine Contact information: [address] [place] [address] [phone]            Consults obtained - ID  Discharge Condition: stable  Diet and Activity recommendation:  See Discharge Instructions below  Discharge Instructions         Discharge Medications     Allergies as of 11/11/2016      Reactions   Latex Itching, Rash   Penicillins Swelling   Swelling in hands; states she still takes it as needed   Tegretol [carbamazepine] Rash   Vicodin [hydrocodone-acetaminophen] Rash      Medication List    TAKE these medications   aspirin-acetaminophen-caffeine 250-250-65 MG tablet Commonly known as:  EXCEDRIN MIGRAINE Take 1 tablet by mouth every 6 (six) hours as needed for headache.   cyclobenzaprine 10 MG tablet Commonly known as:  FLEXERIL Take 1 tablet (10 mg total) by mouth 3 (three) times daily as needed for muscle spasms.   doxycycline 100 MG tablet Commonly known as:  VIBRA-TABS Take 1 tablet (100 mg total) by mouth every 12 (twelve) hours.   meloxicam 15 MG tablet Commonly known as:  MOBIC Take 1 tablet (15 mg total) by mouth daily.   metFORMIN 500 MG 24 hr tablet Commonly known as:  GLUCOPHAGE XR Take 1 tablet (500 mg total) by mouth daily with breakfast.   valACYclovir 500 MG tablet Commonly known as:  VALTREX Take 500 mg by mouth 2 (two) times daily.       Major procedures and Radiology Reports - PLEASE review detailed and final reports for all details, in brief -      Dg Chest 2 View  Result Date: 11/10/2016 CLINICAL DATA:  Fever and headache EXAM: CHEST  2 VIEW COMPARISON:  Chest radiograph 03/19/2009 FINDINGS: Cardiomegaly is unchanged. No pleural effusion or pneumothorax. No focal airspace consolidation or pulmonary edema. IMPRESSION: Unchanged cardiomegaly without acute cardiopulmonary process. Electronically Signed   By: Ulyses Jarred M.D.   On: 11/10/2016 00:36   Ct Head Wo Contrast  Result Date: 11/10/2016 CLINICAL DATA:  Acute onset of headache and fever. Initial encounter. EXAM: CT HEAD WITHOUT CONTRAST TECHNIQUE: Contiguous axial images were obtained from Michaela base of Michaela skull through Michaela vertex without  intravenous contrast. COMPARISON:  None. FINDINGS: Brain: No evidence of acute infarction, hemorrhage, hydrocephalus, extra-axial collection or mass lesion/mass effect. Michaela posterior fossa, including Michaela cerebellum, brainstem and fourth ventricle, is within normal limits. Michaela third and lateral ventricles, and basal ganglia are unremarkable in appearance. Michaela cerebral hemispheres are symmetric in appearance, with normal gray-white differentiation. No mass effect or midline shift is seen. Vascular: No hyperdense vessel or unexpected calcification.  Skull: There is no evidence of fracture; visualized osseous structures are unremarkable in appearance. Sinuses/Orbits: Michaela orbits are within normal limits. Michaela paranasal sinuses and mastoid air cells are well-aerated. Other: No significant soft tissue abnormalities are seen. IMPRESSION: Unremarkable noncontrast CT of Michaela head. Electronically Signed   By: Garald Balding M.D.   On: 11/10/2016 04:06   Dg Fluoro Guide Lumbar Puncture  Result Date: 11/10/2016 CLINICAL DATA:  Headache and fever.  History of pseudotumor. EXAM: DIAGNOSTIC LUMBAR PUNCTURE UNDER FLUOROSCOPIC GUIDANCE FLUOROSCOPY TIME:  Fluoroscopy Time:  3 seconds Number of Acquired Spot Images: 1 PROCEDURE: Informed consent was obtained from Michaela Norris prior to Michaela procedure, including potential complications of headache, allergy, and pain. With Michaela Norris prone, Michaela lower back was prepped with Betadine. 1% Lidocaine was used for local anesthesia. Lumbar puncture was performed at Michaela L4-5 level using a 20 gauge needle with return of clear colorless CSF with an opening pressure of 15 cm water. 11.5 ml of CSF were obtained for laboratory studies. Michaela Norris tolerated Michaela procedure well and there were no apparent complications. IMPRESSION: Lumbar puncture performed with fluoroscopic guidance at L4-5. No apparent complications. Electronically Signed   By: Nolon Nations M.D.   On: 11/10/2016 11:15    Micro  Results     Recent Results (from Michaela past 240 hour(s))  CSF culture with Stat gram stain     Status: None (Preliminary result)   Collection Time: 11/10/16 10:04 AM  Result Value Ref Range Status   Specimen Description CSF  Final   Special Requests NONE  Final   Gram Stain   Final    WBC PRESENT, PREDOMINANTLY MONONUCLEAR NO ORGANISMS SEEN CYTOSPIN SMEAR    Culture PENDING  Incomplete   Report Status PENDING  Incomplete       Today   Subjective    Adajah Cocking today has no headache.  no chest abdominal pain,no new weakness tingling or numbness, feels much better wants to go home today.    Objective   Blood pressure (!) 98/46, pulse 65, temperature 98.5 F (36.9 C), temperature source Oral, resp. rate 20, height 5\' 8"  (1.727 m), weight (!) 136.2 kg (300 lb 3.2 oz), last menstrual period 11/09/2016, SpO2 99 %.   Intake/Output Summary (Last 24 hours) at 11/11/16 0556 Last data filed at 11/10/16 1206  Gross per 24 hour  Intake              600 ml  Output             1200 ml  Net             -600 ml    Exam Awake Alert, Oriented x 3, No new F.N deficits, Normal affect .AT,PERRAL Supple Neck,No JVD, No cervical lymphadenopathy appriciated.  Symmetrical Chest wall movement, Good air movement bilaterally, CTAB RRR,No Gallops,Rubs or new Murmurs, No Parasternal Heave +ve B.Sounds, Abd Soft, Non tender, No organomegaly appriciated, No rebound -guarding or rigidity. No Cyanosis, Clubbing or edema, No new Rash or bruise   Data Review   CBC w Diff: Lab Results  Component Value Date   WBC 6.4 11/10/2016   HGB 10.3 (L) 11/10/2016   HCT 34.2 (L) 11/10/2016   PLT 171 11/10/2016   LYMPHOPCT 23 11/09/2016   MONOPCT 7 11/09/2016   EOSPCT 2 11/09/2016   BASOPCT 0 11/09/2016    CMP: Lab Results  Component Value Date   NA 138 11/10/2016   K 3.9 11/10/2016   CL  107 11/10/2016   CO2 23 11/10/2016   BUN 10 11/10/2016   CREATININE 0.77 11/10/2016   PROT 7.2 11/09/2016     ALBUMIN 3.5 11/09/2016   BILITOT 1.0 11/09/2016   ALKPHOS 70 11/09/2016   AST 22 11/09/2016   ALT 23 11/09/2016  .   Total Time in preparing paper work, data evaluation and todays exam - 72 minutes  Jani Gravel M.D on 11/11/2016 at 5:56 AM  [facility]   Office  [phone]

## 2016-11-12 ENCOUNTER — Telehealth: Payer: Self-pay

## 2016-11-12 LAB — EHRLICHIA ANTIBODY PANEL
E CHAFFEENSIS AB, IGM: NEGATIVE
E chaffeensis (HGE) Ab, IgG: NEGATIVE
E. CHAFFEENSIS IGG AB: NEGATIVE
E. Chaffeensis (HME) IgM Titer: NEGATIVE

## 2016-11-12 LAB — ROCKY MTN SPOTTED FVR ABS PNL(IGG+IGM)
RMSF IGG: NEGATIVE
RMSF IgM: 0.66 index (ref 0.00–0.89)

## 2016-11-12 LAB — PATHOLOGIST SMEAR REVIEW

## 2016-11-12 NOTE — Telephone Encounter (Signed)
Transition Care Management Follow-up Telephone Call   Date discharged? 11/11/2016   How have you been since you were released from the hospital? "Much better, no headaches. Just tired and weak"   Do you understand why you were in the hospital? yes, "viral meningitis"    Do you understand the discharge instructions? yes   Where were you discharged to? Home (adult daughter lives with pt)   Items Reviewed:  Medications reviewed: yes  Allergies reviewed: yes  Dietary changes reviewed: yes  Referrals reviewed: yes   Functional Questionnaire:   Activities of Daily Living (ADLs):   She states they are independent in the following: ambulation, bathing and hygiene, feeding, continence, grooming, toileting and dressing States they require assistance with the following: None   Any transportation issues/concerns?: no   Any patient concerns? no   Confirmed importance and date/time of follow-up visits scheduled yes  Provider Appointment booked with PCP on 11/18/16 @ 11am   Confirmed with patient if condition begins to worsen call PCP or go to the ER.  Patient was given the office number and encouraged to call back with question or concerns.  : yes

## 2016-11-14 LAB — CSF CULTURE W GRAM STAIN

## 2016-11-14 LAB — CSF CULTURE: CULTURE: NO GROWTH

## 2016-11-15 LAB — CULTURE, BLOOD (ROUTINE X 2)
Culture: NO GROWTH
Culture: NO GROWTH
Special Requests: ADEQUATE
Special Requests: ADEQUATE

## 2016-11-18 ENCOUNTER — Ambulatory Visit (INDEPENDENT_AMBULATORY_CARE_PROVIDER_SITE_OTHER): Payer: Medicare Other | Admitting: Family Medicine

## 2016-11-18 ENCOUNTER — Encounter: Payer: Self-pay | Admitting: Family Medicine

## 2016-11-18 VITALS — BP 124/85 | HR 65 | Temp 98.1°F | Resp 16 | Ht 68.0 in | Wt 305.4 lb

## 2016-11-18 DIAGNOSIS — G039 Meningitis, unspecified: Secondary | ICD-10-CM | POA: Diagnosis not present

## 2016-11-18 NOTE — Patient Instructions (Signed)
Schedule your complete physical for January Thankfully no blood work today! Finish the Valtrex as directed Drink plenty of fluids Tylenol/ibuprofen as needed for headaches If you find you are unable to return to work, please let me know and i'm happy to send a note Call with any questions or concerns Hang in there!!!

## 2016-11-18 NOTE — Assessment & Plan Note (Signed)
New to provider.  Resolving since hospital d/c.  Pt reports mild HA but went back to work today.  Reviewed H&P, hospital notes, D/C summer, labs, and imaging.  Med rec performed.  Pt is completing her course of Valtrex as directed and stopped her Doxy when RMSF and Lyme titers were negative.  Reviewed supportive care and red flags that should prompt return.  Pt expressed understanding and is in agreement w/ plan.

## 2016-11-18 NOTE — Progress Notes (Signed)
   Subjective:    Patient ID: Michaela Norris, female    DOB: Jun 29, 1975, 41 y.o.   MRN: [id number]  [facility] f/u- pt was admitted 6/16-18 w/ viral meningitis.  She was started on Doxy for possible tick borne illness but RMSF and Lyme titers were negative.  She also developed a rash that was thought to be zoster and started on Valtrex- this was bx'd and results are pending.  Rash is improving on Valtrex.  CSF cultures were negative- consistent w/ viral meningitis.  Reviewed d/c summary, TCM call.  Med rec performed today.  Pt reports feeling much better than previously but continues to have 'a little headache'.  No fevers for 3-4 days.  No N/V.  Continues to have some light sensitivity.      Review of Systems For ROS see HPI     Objective:   Physical Exam  Constitutional: She is oriented to person, place, and time. She appears well-developed and well-nourished. No distress.  Morbidly obese  HENT:  Head: Normocephalic and atraumatic.  TMs WNL No TTP over sinuses Minimal nasal congestion  Eyes: Conjunctivae and EOM are normal. Pupils are equal, round, and reactive to light.  Neck: Normal range of motion. Neck supple.  Cardiovascular: Normal rate, regular rhythm, normal heart sounds and intact distal pulses.   Pulmonary/Chest: Effort normal and breath sounds normal. No respiratory distress. She has no wheezes. She has no rales.  Lymphadenopathy:    She has no cervical adenopathy.  Neurological: She is alert and oriented to person, place, and time. She has normal reflexes. No cranial nerve deficit. Coordination normal.  Psychiatric: She has a normal mood and affect. Her behavior is normal. Judgment and thought content normal.  Vitals reviewed.         Assessment & Plan:

## 2016-11-18 NOTE — Progress Notes (Signed)
Pre visit review using our clinic review tool, if applicable. No additional management support is needed unless otherwise documented below in the visit note. 

## 2016-12-18 ENCOUNTER — Ambulatory Visit: Payer: Self-pay | Admitting: Family Medicine

## 2016-12-26 ENCOUNTER — Ambulatory Visit (INDEPENDENT_AMBULATORY_CARE_PROVIDER_SITE_OTHER): Payer: Medicare Other | Admitting: Allergy

## 2016-12-26 ENCOUNTER — Encounter: Payer: Self-pay | Admitting: Allergy

## 2016-12-26 VITALS — BP 104/70 | HR 75 | Temp 98.3°F | Resp 20 | Ht 67.0 in | Wt 310.4 lb

## 2016-12-26 DIAGNOSIS — L298 Other pruritus: Secondary | ICD-10-CM | POA: Diagnosis not present

## 2016-12-26 DIAGNOSIS — N898 Other specified noninflammatory disorders of vagina: Secondary | ICD-10-CM

## 2016-12-26 NOTE — Patient Instructions (Addendum)
Vaginal pruritus and dermatitis      - she had onset of vaginal itch/pain/rash around the same time of starting Provera.  However she does not report a consistent time frame for onset of vaginal symptoms and taking monthly Provera.  She also has had some months go by where she did not have vaginal symptoms despite taking Provera monthly.  There is concern from her OB/GYN for autoimmune progesterone dermatitis (APD).  APD is a very rase cyclical, pruritic dermatosis.  Symptoms will usually appear during the luteal phase of menstrual cycle.   Thus having months without any symptoms would be inconsistent with APD.  There is usually also a visible dermatitis with APD and until last month she has not had any visible or palpable dermatoses.  She denies any other systemic symptoms outside of the cutaneous symptoms or anaphylaxis.  Upon further research of histopath a/w APD most consistent histiologic finding is perivascular inflammatory infiltrate and in some biopies c/w urticarial lesions, bullous EM and lichen simplex chronicus.  This pt's biopy is not c/w with findings of APD and most closely resembled condyloma acuminata.       - discussed with pt to discuss with her OB/Gyn regarding changing therapy to a estrogen containing agent if possible.  Removal of possible offending agent and possible rechallenge could help determine if she is allergic or intolerant of progesterone.       - diagnosis of APD can be supported by skin testing however may need to refer to academic center to have this performed (which we can provide with referral).       - also discussed ?contact dermatitis as this is in the differential.  She is interested in patch testing as she reports history of sensitivity to certain soaps/body products.  She would like to wait until her winter break to have this performed.

## 2016-12-26 NOTE — Progress Notes (Signed)
New Patient Note  RE: Michaela Norris MRN: [id number] DOB: 19-Dec-1975 Date of Office Visit: 12/26/2016  Referring provider: Midge Minium, MD   Primary care provider: Midge Minium, MD  Chief Complaint: vaginal itch/rash   History of present illness: Michaela Norris is a 41 y.o. female presenting today for consultation for vaginal itch/rash.  She presents today with her partner.  She has been seeing Dr. Royston Sinner in OB/GYN who started her on Provera to help regulate her cycle.  She reports she started Provera about a year ago.  Around that time she started having a vaginal itch/pain monthly.  She reports the vaginal symptoms occurred monthly but she denies it occurred the same time each month.  She also denies that the symptoms occurred when she was actively taking the Provera.   She also states there have been some months when she did not get any vaginal symptoms at all.  She states initially she was taking the Provera for 7 days but was increased to 14 days at a time several months ago.   She states she has not been able to find any consistencies between her Provera and when vaginal symptoms would occur.  She reports up until last month she has never had a visible or palpable rash.  She denies any systemic symptoms (no GI, respiratory or CV related symptoms).  She reports she was checked and treated for yeast infection and has had STI testing.  She reports the treatment for yeast infection did not help.  She reports her and her partner have also tried to see if there were any consistencies with any use of lubricants/sexual aids and the onset of the rash but unable to find any consistencies there.  Her partner even reported she changed use of toothpaste to see it that would help prevent onset of the symptoms.   Michaela Norris does report having sensitive skin in general and has had issues with past with rash development following some soaps/lotions.     Last month however the symptoms  changed and she did develop a rash that spread ventrally and up her back.  She reports the rash was painful and blistering.  Around the same time she states she had a tick bite.   She was hospitalized for viral meningitis and treated empirically with bactrim -> meropenem, acyclovir and vanc (all of which were d/c with negative cultures).   Doxcycline added for possible tick borne illness but RMSF and lyme titers were negative.  They thought the rash she had with the blistering may have been zoster and thus Valtrex initiated and rash did improve.  CSF cultures were negative.    She presented to the ED initially for fever and headache after awakening with an intense, throbbing HA a/w light sensitivity.   She did have tick exposure on Memorial day.  Head CT was unremarkable.   She states the rash did improve and she only has residual flat red rash mostly on her lower back.   She did have a biopsy of the rash done by her OB/gyn specimen taken from the left majora labium that was c/w "polypoid squamous mucosa with mild koilocytic atypia consistent with condyloma acuminatum".   She has never per OB records any evidence of genital warts.   She reports she does take meloxicam on occasion for back pain.    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  She is still taking Provera.      Review of  systems: Review of Systems  Constitutional: Negative for chills, fever and malaise/fatigue.  HENT: Negative for congestion, ear discharge, ear pain, nosebleeds, sinus pain and sore throat.   Eyes: Negative for discharge.  Respiratory: Negative for cough, shortness of breath and wheezing.   Cardiovascular: Negative for chest pain.  Gastrointestinal: Negative for abdominal pain, constipation, diarrhea, heartburn, nausea and vomiting.  Genitourinary: Negative for dysuria.  Musculoskeletal: Negative for joint pain and myalgias.  Skin: Positive for itching and rash.  Neurological: Negative for  dizziness and headaches.    All other systems negative unless noted above in HPI  Past medical history: Past Medical History:  Diagnosis Date  . Anxiety   . Depression   . Headache(784.0)   . High blood pressure    off meds at this time  . Hyperlipidemia   . Mania ([place])   . Obesity   . OSA on CPAP 03/18/2013  . Psychosis   . PTSD (post-traumatic stress disorder)   . Schizoaffective disorder     Past surgical history: Past Surgical History:  Procedure Laterality Date  . broken ankle  2000  . CHOLECYSTECTOMY    . FRACTURE SURGERY      Family history:  Family History  Problem Relation Age of Onset  . Alcohol abuse Mother   . Depression Mother   . Alcohol abuse Brother   . Suicidality Maternal Grandmother   . Anorexia nervosa Maternal Grandmother   . Asperger's syndrome Daughter   . Schizophrenia Father   . Brain cancer Father   . Early death Father   . Allergic rhinitis Neg Hx   . Angioedema Neg Hx   . Asthma Neg Hx   . Eczema Neg Hx   . Immunodeficiency Neg Hx   . Urticaria Neg Hx     Social history: She lives in an apartment with her partner with carpeting with electric heating and central cooling. There is a cat and dog in the home. There is no concern for water damage, mildew or roaches in the home. She is a grass Gaffer. She has no smoking history.   Medication List: Allergies as of 12/26/2016      Reactions   Latex Itching, Rash   Penicillins Swelling   Swelling in hands; states she still takes it as needed   Tegretol [carbamazepine] Rash   Vicodin [hydrocodone-acetaminophen] Rash      Medication List       Accurate as of 12/26/16  6:38 PM. Always use your most recent med list.          aspirin-acetaminophen-caffeine 250-250-65 MG tablet Commonly known as:  EXCEDRIN MIGRAINE Take 1 tablet by mouth every 6 (six) hours as needed for headache.   cyclobenzaprine 10 MG tablet Commonly known as:  FLEXERIL Take 1 tablet (10 mg  total) by mouth 3 (three) times daily as needed for muscle spasms.   IRON (FERROUS SULFATE) PO Take 1 tablet by mouth daily.   medroxyPROGESTERone 10 MG tablet Commonly known as:  PROVERA Take 1 tablet by mouth daily. X 14 days.   meloxicam 15 MG tablet Commonly known as:  MOBIC Take 1 tablet (15 mg total) by mouth daily.   metFORMIN 500 MG 24 hr tablet Commonly known as:  GLUCOPHAGE XR Take 1 tablet (500 mg total) by mouth daily with breakfast.   PROBIOTIC DAILY PO Take by mouth.       Known medication allergies: Allergies  Allergen Reactions  . Latex Itching and Rash  . Penicillins  Swelling    Swelling in hands; states she still takes it as needed  . Tegretol [Carbamazepine] Rash  . Vicodin [Hydrocodone-Acetaminophen] Rash     Physical examination: Blood pressure 104/70, pulse 75, temperature 98.3 F (36.8 C), temperature source Oral, resp. rate 20, height 5\' 7"  (1.702 m), weight (!) 310 lb 6.4 oz (140.8 kg), SpO2 96 %.  General: Alert, interactive, in no acute distress, obese. HEENT: PERRLA, TMs pearly gray, turbinates minimally edematous without discharge, post-pharynx non erythematous. Neck: Supple without lymphadenopathy. Lungs: Clear to auscultation without wheezing, rhonchi or rales. {no increased work of breathing. CV: Normal S1, S2 without murmurs. Abdomen: Nondistended, nontender. Skin: lower back and left buttock with 2 dime sized areas of pinpoint erythema that blanches. Extremities:  No clubbing, cyanosis or edema. Neuro:   Grossly intact.  Diagnositics/Labs: Labs: reviewed viral cultures, CBC, CSF study all unremarkable See HPI for biopsy results  Assessment and plan: Patient Instructions  Vaginal pruritus and dermatitis      - she had onset of vaginal itch/pain/rash around the same time of starting Provera.  However she does not report a consistent time frame for onset of vaginal symptoms and taking monthly Provera.  She also has had some months  go by where she did not have vaginal symptoms despite taking Provera monthly.  There is concern from her OB/GYN for autoimmune progesterone dermatitis (APD).  APD is a very rase cyclical, pruritic dermatosis.  Symptoms will usually appear during the luteal phase of menstrual cycle.   Thus having months without any symptoms would be inconsistent with APD.  There is usually also a visible dermatitis with APD and until last month she has not had any visible or palpable dermatoses.  She denies any other systemic symptoms outside of the cutaneous symptoms or anaphylaxis.  Upon further research of histopath a/w APD most consistent histiologic finding is perivascular inflammatory infiltrate and in some biopies c/w urticarial lesions, bullous EM and lichen simplex chronicus.  This pt's biopy is not c/w with findings of APD and most closely resembled condyloma acuminata.       - discussed with pt to discuss with her OB/Gyn regarding changing therapy to a estrogen containing agent or non-progesterone agent if possible.  Removal of possible offending agent and rechallenge/graded challenge could help determine if she is allergic or intolerant of progesterone.       - diagnosis of APD can be supported by skin testing however likely needs referal to academic center to have this performed (which we can provide with referral).       - also discussed ?contact dermatitis as this is in the differential.  She is interested in patch testing as she reports history of sensitivity to certain soaps/body products.  She would like to wait until her winter break to have this performed.    I appreciate the opportunity to take part in Kalyssa's care. Please do not hesitate to contact me with questions.  Sincerely,   Prudy Feeler, MD Allergy/Immunology [facility]

## 2016-12-29 ENCOUNTER — Other Ambulatory Visit: Payer: Self-pay | Admitting: Family Medicine

## 2017-01-04 ENCOUNTER — Encounter (HOSPITAL_COMMUNITY): Payer: Self-pay | Admitting: Family Medicine

## 2017-01-04 ENCOUNTER — Ambulatory Visit (HOSPITAL_COMMUNITY)
Admission: EM | Admit: 2017-01-04 | Discharge: 2017-01-04 | Disposition: A | Discharge status: Home / Self Care | Payer: Medicare Other | Attending: Family Medicine | Admitting: Family Medicine

## 2017-01-04 DIAGNOSIS — J40 Bronchitis, not specified as acute or chronic: Secondary | ICD-10-CM

## 2017-01-04 MED ORDER — ALBUTEROL SULFATE HFA 108 (90 BASE) MCG/ACT IN AERS: 2.0000 | INHALATION_SPRAY | RESPIRATORY_TRACT | 1 refills | Status: DC | PRN

## 2017-01-04 MED ORDER — HYDROCODONE-HOMATROPINE 5-1.5 MG/5ML PO SYRP: 5.0000 mL | ORAL_SOLUTION | Freq: Four times a day (QID) | ORAL | 0 refills | Status: DC | PRN

## 2017-01-04 NOTE — Discharge Instructions (Signed)
This appears to be a viral bronchitis. Uses to see improvement in the next 2-3 days, will consult her primary care doctor.

## 2017-01-04 NOTE — ED Triage Notes (Signed)
Pt here for cough x 3 weeks, and low grade fever that started this am.

## 2017-01-04 NOTE — ED Provider Notes (Signed)
[facility]    CSN: [id number] Arrival date & time: 01/04/17  1432     History   Chief Complaint Chief Complaint  Patient presents with  . Cough    HPI Michaela Norris is a 41 y.o. female.   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 She comes in for evaluation of a cough that's been going on for 3 weeks. She said that she had a low-grade temperature this morning and that prompted her visit today.  In addition to the cough, patient had tightness in her chest. She has no history of asthma or other respiratory illness. She did have a hospitalization in June, 2 months ago, for viral meningitis.      Past Medical History:  Diagnosis Date  . Anxiety   . Depression   . Headache(784.0)   . High blood pressure    off meds at this time  . Hyperlipidemia   . Mania ([place])   . Obesity   . OSA on CPAP 03/18/2013  . Psychosis   . PTSD (post-traumatic stress disorder)   . Schizoaffective disorder     Patient Active Problem List   Diagnosis Date Noted  . Meningitis 11/10/2016  . Tick bite 11/10/2016  . Rash of perineum 11/10/2016  . PCOS (polycystic ovarian syndrome) 06/19/2016  . Morbid obesity ([place]) 06/19/2016  . Abdominal pain, epigastric 11/03/2014  . OSA on CPAP 03/18/2013  . BPPV (benign paroxysmal positional vertigo) 03/08/2013  . Abdominal gas pain 11/05/2012  . Asperger's disorder 10/07/2012  . Physical exam 02/26/2012  . Schizoaffective disorder ([place]) 12/25/2011  . Anemia 12/25/2011  . Hyperlipidemia 12/25/2011  . Pseudotumor cerebri 12/25/2011  . Bipolar 1 disorder, depressed ([place]) 08/15/2011    Past Surgical History:  Procedure Laterality Date  . broken ankle  2000  . CHOLECYSTECTOMY    . FRACTURE SURGERY      OB History    No data available       Home Medications    Prior to Admission medications   Medication Sig Start Date End Date Taking?  Authorizing Provider  albuterol (PROVENTIL HFA;VENTOLIN HFA) 108 (90 Base) MCG/ACT inhaler Inhale 2 puffs into the lungs every 4 (four) hours as needed for wheezing or shortness of breath (cough, shortness of breath or wheezing.). 01/04/17   Robyn Haber, MD  aspirin-acetaminophen-caffeine (EXCEDRIN MIGRAINE) [phone] MG tablet Take 1 tablet by mouth every 6 (six) hours as needed for headache.    [provider]  cyclobenzaprine (FLEXERIL) 10 MG tablet Take 1 tablet (10 mg total) by mouth 3 (three) times daily as needed for muscle spasms. 10/22/16   Lysbeth Penner, FNP  HYDROcodone-homatropine (HYDROMET) 5-1.5 MG/5ML syrup Take 5 mLs by mouth every 6 (six) hours as needed for cough. 01/04/17   Robyn Haber, MD  IRON, FERROUS SULFATE, PO Take 1 tablet by mouth daily.    [provider]  medroxyPROGESTERone (PROVERA) 10 MG tablet Take 1 tablet by mouth daily. X 14 days. 11/08/16   [provider]  meloxicam (MOBIC) 15 MG tablet TAKE 1 TABLET(15 MG) BY MOUTH DAILY 12/30/16   Midge Minium, MD  metFORMIN (GLUCOPHAGE XR) 500 MG 24 hr tablet Take 1 tablet (500 mg total) by mouth daily with breakfast. 06/19/16 06/19/17  Midge Minium, MD  Probiotic Product (PROBIOTIC DAILY PO) Take by mouth.    [provider]    Family History Family  History  Problem Relation Age of Onset  . Alcohol abuse Mother   . Depression Mother   . Alcohol abuse Brother   . Suicidality Maternal Grandmother   . Anorexia nervosa Maternal Grandmother   . Asperger's syndrome Daughter   . Schizophrenia Father   . Brain cancer Father   . Early death Father   . Allergic rhinitis Neg Hx   . Angioedema Neg Hx   . Asthma Neg Hx   . Eczema Neg Hx   . Immunodeficiency Neg Hx   . Urticaria Neg Hx     Social History Social History  Substance Use Topics  . Smoking status: Never Smoker  . Smokeless tobacco: Never Used  . Alcohol use No     Allergies   Latex; Penicillins;  Tegretol [carbamazepine]; and Vicodin [hydrocodone-acetaminophen]   Review of Systems Review of Systems  Constitutional: Positive for fever.  Respiratory: Positive for cough and chest tightness.   All other systems reviewed and are negative.    Physical Exam Triage Vital Signs ED Triage Vitals [01/04/17 1505]  Enc Vitals Group     BP (!) 144/87     Pulse Rate 78     Resp 18     Temp 98.2 F (36.8 C)     Temp src      SpO2 97 %     Weight      Height      Head Circumference      Peak Flow      Pain Score      Pain Loc      Pain Edu?      Excl. in [place]?    No data found.   Updated Vital Signs BP (!) 144/87   Pulse 78   Temp 98.2 F (36.8 C)   Resp 18   SpO2 97%   Visual Acuity Right Eye Distance:   Left Eye Distance:   Bilateral Distance:    Right Eye Near:   Left Eye Near:    Bilateral Near:     Physical Exam  Constitutional: She is oriented to person, place, and time. She appears well-developed and well-nourished.  HENT:  Right Ear: External ear normal.  Left Ear: External ear normal.  Mouth/Throat: Oropharynx is clear and moist.  Eyes: Pupils are equal, round, and reactive to light. Conjunctivae are normal.  Neck: Normal range of motion. Neck supple.  Cardiovascular: Normal rate, regular rhythm and normal heart sounds.   Pulmonary/Chest: Effort normal and breath sounds normal.  Musculoskeletal: Normal range of motion.  Neurological: She is alert and oriented to person, place, and time. Abnormal muscle tone: Yes.  Skin: Skin is warm and dry.  Nursing note and vitals reviewed.    UC Treatments / Results  Labs (all labs ordered are listed, but only abnormal results are displayed) Labs Reviewed - No data to display  EKG  EKG Interpretation None       Radiology No results found.  Procedures Procedures (including critical care time)  Medications Ordered in UC Medications - No data to display   Initial Impression / Assessment and Plan  / UC Course  I have reviewed the triage vital signs and the nursing notes.  Pertinent labs & imaging results that were available during my care of the patient were reviewed by me and considered in my medical decision making (see chart for details).     Final Clinical Impressions(s) / UC Diagnoses   Final diagnoses:  Bronchitis  New Prescriptions New Prescriptions   ALBUTEROL (PROVENTIL HFA;VENTOLIN HFA) 108 (90 BASE) MCG/ACT INHALER    Inhale 2 puffs into the lungs every 4 (four) hours as needed for wheezing or shortness of breath (cough, shortness of breath or wheezing.).   HYDROCODONE-HOMATROPINE (HYDROMET) 5-1.5 MG/5ML SYRUP    Take 5 mLs by mouth every 6 (six) hours as needed for cough.     Controlled Substance Prescriptions [place] Controlled Substance Registry consulted?    Robyn Haber, MD 01/04/17 [phone]

## 2017-01-06 ENCOUNTER — Telehealth: Payer: Self-pay

## 2017-01-06 NOTE — Telephone Encounter (Signed)
I contacted the patients PCP office, they stated they do not take the patients Medicaid, but she will contact her manager to see how to handle this situation in order to get an authorization. I also called Dr. Marlowe Alt office to make sure they take Medicare & Medicaid and they do accept it.   Will wait for the referral coordinator to follow back up.

## 2017-01-06 NOTE — Telephone Encounter (Signed)
-----   Message from [place], MD sent at 01/03/2017  8:48 AM EDT ----- Regarding: referral I saw this pt on 8/2 for initial visit for concern of possible autoimmune progesterone dermatitis.   I have contacted a colleague at [facility] who told me Dr. Richardean Sale sees a lot of these pt and does testing.  I would like for this pt to be referred to see Dr. Clista Bernhardt for further evaluation/testing for ?autoimmune progesterone dermatitis.   I have let the pt know that will be referring to Dr. Clista Bernhardt.   Let me know if I need to do anything further.     Thanks, Dr Mamie Nick.

## 2017-01-06 NOTE — Telephone Encounter (Signed)
Referral faxed to Dr. Clista Bernhardt [phone]

## 2017-01-06 NOTE — Telephone Encounter (Signed)
The manager Estill Bamberg at Dr. Birdie Riddle stated the patient doesn't have MCD [place] and that she doesn't require an NPI# or # of visit to be seen at Dr. Marlowe Alt office.

## 2017-01-07 NOTE — Telephone Encounter (Signed)
Thanks

## 2017-05-28 ENCOUNTER — Ambulatory Visit (INDEPENDENT_AMBULATORY_CARE_PROVIDER_SITE_OTHER): Payer: Medicare Other | Admitting: Family Medicine

## 2017-05-28 ENCOUNTER — Encounter: Payer: Self-pay | Admitting: Family Medicine

## 2017-05-28 ENCOUNTER — Other Ambulatory Visit: Payer: Self-pay

## 2017-05-28 DIAGNOSIS — F259 Schizoaffective disorder, unspecified: Secondary | ICD-10-CM

## 2017-05-28 DIAGNOSIS — Z23 Encounter for immunization: Secondary | ICD-10-CM | POA: Diagnosis not present

## 2017-05-28 LAB — LIPID PANEL
CHOLESTEROL: 102 mg/dL (ref 0–200)
HDL: 29.2 mg/dL — AB (ref >39.00)
NONHDL: 72.67
TRIGLYCERIDES: 209 mg/dL — AB (ref 0.0–149.0)
Total CHOL/HDL Ratio: 3
VLDL: 41.8 mg/dL — ABNORMAL HIGH (ref 0.0–40.0)

## 2017-05-28 LAB — LDL CHOLESTEROL, DIRECT: Direct LDL: 52 mg/dL

## 2017-05-28 LAB — BASIC METABOLIC PANEL
BUN: 11 mg/dL (ref 6–23)
CALCIUM: 9.2 mg/dL (ref 8.4–10.5)
CHLORIDE: 104 meq/L (ref 96–112)
CO2: 27 meq/L (ref 19–32)
CREATININE: 0.56 mg/dL (ref 0.40–1.20)
GFR: 127.06 mL/min (ref >60.00)
Glucose, Bld: 91 mg/dL (ref 70–99)
Potassium: 4.4 mEq/L (ref 3.5–5.1)
Sodium: 138 mEq/L (ref 135–145)

## 2017-05-28 LAB — CBC WITH DIFFERENTIAL/PLATELET
BASOS ABS: 0.1 10*3/uL (ref 0.0–0.1)
Basophils Relative: 0.6 % (ref 0.0–3.0)
EOS ABS: 0.3 10*3/uL (ref 0.0–0.7)
Eosinophils Relative: 2.4 % (ref 0.0–5.0)
HEMATOCRIT: 33.6 % — AB (ref 36.0–46.0)
Hemoglobin: 10.2 g/dL — ABNORMAL LOW (ref 12.0–15.0)
LYMPHS PCT: 19.3 % (ref 12.0–46.0)
Lymphs Abs: 2.3 10*3/uL (ref 0.7–4.0)
MCHC: 30.2 g/dL (ref 30.0–36.0)
MONOS PCT: 4.2 % (ref 3.0–12.0)
Monocytes Absolute: 0.5 10*3/uL (ref 0.1–1.0)
NEUTROS ABS: 8.9 10*3/uL — AB (ref 1.4–7.7)
Neutrophils Relative %: 73.5 % (ref 43.0–77.0)
PLATELETS: 228 10*3/uL (ref 150.0–400.0)
RBC: 5.1 Mil/uL (ref 3.87–5.11)
RDW: 18.8 % — ABNORMAL HIGH (ref 11.5–15.5)
WBC: 12.1 10*3/uL — ABNORMAL HIGH (ref 4.0–10.5)

## 2017-05-28 LAB — HEPATIC FUNCTION PANEL
ALBUMIN: 3.9 g/dL (ref 3.5–5.2)
ALT: 12 U/L (ref 0–35)
AST: 9 U/L (ref 0–37)
Alkaline Phosphatase: 77 U/L (ref 39–117)
BILIRUBIN DIRECT: 0.2 mg/dL (ref 0.0–0.3)
TOTAL PROTEIN: 6.9 g/dL (ref 6.0–8.3)
Total Bilirubin: 0.9 mg/dL (ref 0.2–1.2)

## 2017-05-28 MED ORDER — MELOXICAM 15 MG PO TABS: ORAL_TABLET | ORAL | 1 refills | Status: DC

## 2017-05-28 NOTE — Assessment & Plan Note (Signed)
Pt was dx'd years ago but has not been on any meds recently and sxs are well controlled.  Pt feels she was misdiagnosed at the time and that she was dealing w/ PTSD and high anxiety levels.  Currently doing well but will continue to follow.

## 2017-05-28 NOTE — Progress Notes (Signed)
   Subjective:    Patient ID: Michaela Norris, female    DOB: 1975-10-10, 42 y.o.   MRN: [id number]  HPI Morbid obesity- ongoing issue for pt.  She has gained 4 lbs since last visit.  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  No regular exercise.  No CP, SOB, HAs, abd pain, N/V.  + back pain.  Bipolar 1/Schizoaffective disorder- not currently on medication and hasn't been 'for years'.  Pt doesn't feel that Bipolar dx was ever correct- feels it was more PTSD which she feels she 'has a handle on'.  + SAD  Desires flu shot today   Review of Systems For ROS see HPI     Objective:   Physical Exam  Constitutional: She is oriented to person, place, and time. She appears well-developed and well-nourished. No distress.  Morbidly obese  HENT:  Head: Normocephalic and atraumatic.  Eyes: Conjunctivae and EOM are normal. Pupils are equal, round, and reactive to light.  Neck: Normal range of motion. Neck supple. No thyromegaly present.  Cardiovascular: Normal rate, regular rhythm, normal heart sounds and intact distal pulses.  No murmur heard. Pulmonary/Chest: Effort normal and breath sounds normal. No respiratory distress.  Abdominal: Soft. She exhibits no distension. There is no tenderness.  Musculoskeletal: She exhibits no edema.  Lymphadenopathy:    She has no cervical adenopathy.  Neurological: She is alert and oriented to person, place, and time.  Skin: Skin is warm and dry.  Psychiatric: She has a normal mood and affect. Her behavior is normal.  Vitals reviewed.         Assessment & Plan:

## 2017-05-28 NOTE — Assessment & Plan Note (Signed)
Deteriorated.  Pt has gained 4 lbs since last visit.  She is not getting any regular exercise and is not following a particular diet.  Stressed need for both.  Check labs to risk stratify.  Will follow.

## 2017-05-28 NOTE — Patient Instructions (Signed)
Please schedule your Medicare Wellness Visit in 6 months and a follow up with me to recheck weight loss progress We'll notify you of your lab results and make any changes if needed Continue to work on healthy diet and regular exercise- you can do it! Use the Meloxicam as needed for back pain Call with any questions or concerns Happy New Year!!

## 2017-05-29 ENCOUNTER — Ambulatory Visit: Payer: Self-pay

## 2017-05-30 ENCOUNTER — Other Ambulatory Visit: Payer: Self-pay | Admitting: General Practice

## 2017-05-30 MED ORDER — FERROUS SULFATE 325 (65 FE) MG PO TABS: 325.0000 mg | ORAL_TABLET | Freq: Every day | ORAL | 6 refills | Status: AC

## 2017-06-26 ENCOUNTER — Other Ambulatory Visit: Payer: Self-pay | Admitting: Family Medicine

## 2017-06-26 DIAGNOSIS — E282 Polycystic ovarian syndrome: Secondary | ICD-10-CM

## 2017-07-18 ENCOUNTER — Other Ambulatory Visit: Payer: Self-pay | Admitting: Family Medicine

## 2017-07-18 ENCOUNTER — Encounter: Payer: Self-pay | Admitting: Family Medicine

## 2017-07-18 DIAGNOSIS — Z6841 Body Mass Index (BMI) 40.0 and over, adult: Secondary | ICD-10-CM | POA: Diagnosis not present

## 2017-07-18 DIAGNOSIS — Z01419 Encounter for gynecological examination (general) (routine) without abnormal findings: Secondary | ICD-10-CM | POA: Diagnosis not present

## 2017-07-18 DIAGNOSIS — E282 Polycystic ovarian syndrome: Secondary | ICD-10-CM | POA: Diagnosis not present

## 2017-08-15 DIAGNOSIS — G4733 Obstructive sleep apnea (adult) (pediatric): Secondary | ICD-10-CM | POA: Diagnosis not present

## 2017-08-15 DIAGNOSIS — E8881 Metabolic syndrome: Secondary | ICD-10-CM | POA: Diagnosis not present

## 2017-08-15 DIAGNOSIS — Z9989 Dependence on other enabling machines and devices: Secondary | ICD-10-CM | POA: Diagnosis not present

## 2017-08-15 DIAGNOSIS — Z6841 Body Mass Index (BMI) 40.0 and over, adult: Secondary | ICD-10-CM | POA: Diagnosis not present

## 2017-09-23 DIAGNOSIS — G932 Benign intracranial hypertension: Secondary | ICD-10-CM | POA: Diagnosis not present

## 2017-09-23 DIAGNOSIS — R799 Abnormal finding of blood chemistry, unspecified: Secondary | ICD-10-CM | POA: Diagnosis not present

## 2017-09-23 DIAGNOSIS — G4733 Obstructive sleep apnea (adult) (pediatric): Secondary | ICD-10-CM | POA: Diagnosis not present

## 2017-09-23 DIAGNOSIS — Z9989 Dependence on other enabling machines and devices: Secondary | ICD-10-CM | POA: Diagnosis not present

## 2017-09-23 DIAGNOSIS — K900 Celiac disease: Secondary | ICD-10-CM | POA: Diagnosis not present

## 2017-09-23 DIAGNOSIS — F419 Anxiety disorder, unspecified: Secondary | ICD-10-CM | POA: Diagnosis not present

## 2017-09-23 DIAGNOSIS — E8881 Metabolic syndrome: Secondary | ICD-10-CM | POA: Diagnosis not present

## 2017-09-23 DIAGNOSIS — R635 Abnormal weight gain: Secondary | ICD-10-CM | POA: Diagnosis not present

## 2017-09-23 DIAGNOSIS — E559 Vitamin D deficiency, unspecified: Secondary | ICD-10-CM | POA: Diagnosis not present

## 2017-09-23 DIAGNOSIS — F259 Schizoaffective disorder, unspecified: Secondary | ICD-10-CM | POA: Diagnosis not present

## 2017-09-26 ENCOUNTER — Ambulatory Visit (HOSPITAL_COMMUNITY)
Admission: RE | Admit: 2017-09-26 | Discharge: 2017-09-26 | Disposition: A | Discharge status: Home / Self Care | Payer: Self-pay | Source: Ambulatory Visit | Admitting: Radiology

## 2017-09-26 DIAGNOSIS — Z1231 Encounter for screening mammogram for malignant neoplasm of breast: Secondary | ICD-10-CM | POA: Diagnosis not present

## 2017-09-26 DIAGNOSIS — N83201 Unspecified ovarian cyst, right side: Secondary | ICD-10-CM | POA: Diagnosis not present

## 2017-09-26 DIAGNOSIS — Z0389 Encounter for observation for other suspected diseases and conditions ruled out: Secondary | ICD-10-CM | POA: Diagnosis not present

## 2017-10-24 DIAGNOSIS — K900 Celiac disease: Secondary | ICD-10-CM | POA: Diagnosis not present

## 2017-10-24 DIAGNOSIS — Z713 Dietary counseling and surveillance: Secondary | ICD-10-CM | POA: Diagnosis not present

## 2017-10-24 DIAGNOSIS — R635 Abnormal weight gain: Secondary | ICD-10-CM | POA: Diagnosis not present

## 2017-10-24 DIAGNOSIS — G4733 Obstructive sleep apnea (adult) (pediatric): Secondary | ICD-10-CM | POA: Diagnosis not present

## 2017-10-24 DIAGNOSIS — E559 Vitamin D deficiency, unspecified: Secondary | ICD-10-CM | POA: Diagnosis not present

## 2017-10-24 DIAGNOSIS — R799 Abnormal finding of blood chemistry, unspecified: Secondary | ICD-10-CM | POA: Diagnosis not present

## 2017-10-24 DIAGNOSIS — G932 Benign intracranial hypertension: Secondary | ICD-10-CM | POA: Diagnosis not present

## 2017-10-24 DIAGNOSIS — E8881 Metabolic syndrome: Secondary | ICD-10-CM | POA: Diagnosis not present

## 2017-10-24 DIAGNOSIS — Z9989 Dependence on other enabling machines and devices: Secondary | ICD-10-CM | POA: Diagnosis not present

## 2017-10-24 DIAGNOSIS — F259 Schizoaffective disorder, unspecified: Secondary | ICD-10-CM | POA: Diagnosis not present

## 2017-10-28 DIAGNOSIS — G4733 Obstructive sleep apnea (adult) (pediatric): Secondary | ICD-10-CM | POA: Diagnosis not present

## 2017-10-28 DIAGNOSIS — Z9989 Dependence on other enabling machines and devices: Secondary | ICD-10-CM | POA: Diagnosis not present

## 2017-10-28 DIAGNOSIS — G932 Benign intracranial hypertension: Secondary | ICD-10-CM | POA: Diagnosis not present

## 2017-10-28 DIAGNOSIS — Z136 Encounter for screening for cardiovascular disorders: Secondary | ICD-10-CM | POA: Diagnosis not present

## 2017-10-28 DIAGNOSIS — Z6841 Body Mass Index (BMI) 40.0 and over, adult: Secondary | ICD-10-CM | POA: Diagnosis not present

## 2017-11-12 DIAGNOSIS — Z6841 Body Mass Index (BMI) 40.0 and over, adult: Secondary | ICD-10-CM | POA: Diagnosis not present

## 2017-11-12 DIAGNOSIS — F54 Psychological and behavioral factors associated with disorders or diseases classified elsewhere: Secondary | ICD-10-CM | POA: Diagnosis not present

## 2017-11-12 DIAGNOSIS — Z7189 Other specified counseling: Secondary | ICD-10-CM | POA: Diagnosis not present

## 2017-11-25 NOTE — Progress Notes (Addendum)
Subjective:   Michaela Norris is a 42 y.o. female who presents for Medicare Annual (Subsequent) preventive examination.  Review of Systems:  No ROS.  Medicare Wellness Visit. Additional risk factors are reflected in the social history.  Cardiac Risk Factors include: obesity (BMI >30kg/m2);hypertension;dyslipidemia   Sleep patterns: Sleeps 7 hours, uses CPAP.  Home Safety/Smoke Alarms: Feels safe in home. Smoke alarms in place.  Living environment; residence and Firearm Safety: Lives with wife in apartment (2nd floor).  Seat Belt Safety/Bike Helmet: Wears seat belt.   Female:   Pap-07/10/2016       Mammo-08/25/2017, pt reports normal. [facility]      Dexa scan-N/A        CCS-Colonoscopy 05/28/2015, reports polyps. Recall 10 years.      Objective:     Vitals: BP 130/72 (BP Location: Right Arm, Patient Position: Sitting, Cuff Size: Large)   Pulse 67   Temp 97.9 F (36.6 C) (Temporal)   Resp 18   Ht 5\' 7"  (1.702 m)   Wt (!) 324 lb 6 oz (147.1 kg)   SpO2 99%   BMI 50.80 kg/m   Body mass index is 50.8 kg/m.  Advanced Directives 11/26/2017 11/10/2016 11/09/2016 10/22/2016 06/04/2015 10/02/2014  Does Patient Have a Medical Advance Directive? No No No No No No  Would patient like information on creating a medical advance directive? Yes (MAU/Ambulatory/Procedural Areas - Information given) No - Patient declined - - No - patient declined information No - patient declined information    Tobacco Social History   Tobacco Use  Smoking Status Never Smoker  Smokeless Tobacco Never Used     Counseling given: Not Answered    Past Medical History:  Diagnosis Date  . Anxiety   . Depression   . Headache(784.0)   . High blood pressure    off meds at this time  . Hyperlipidemia   . Mania ([place])   . Obesity   . OSA on CPAP 03/18/2013  . Psychosis ([place])   . PTSD (post-traumatic stress disorder)   . Schizoaffective disorder    Past Surgical History:  Procedure  Laterality Date  . broken ankle  2000  . CHOLECYSTECTOMY    . FRACTURE SURGERY     Family History  Problem Relation Age of Onset  . Alcohol abuse Mother   . Depression Mother   . Alcohol abuse Brother   . Suicidality Maternal Grandmother   . Anorexia nervosa Maternal Grandmother   . Asperger's syndrome Daughter   . Schizophrenia Father   . Brain cancer Father   . Early death Father   . Allergic rhinitis Neg Hx   . Angioedema Neg Hx   . Asthma Neg Hx   . Eczema Neg Hx   . Immunodeficiency Neg Hx   . Urticaria Neg Hx    Social History   Socioeconomic History  . Marital status: Single    Spouse name: Not on file  . Number of children: Not on file  . Years of education: Not on file  . Highest education level: Not on file  Occupational History  . Not on file  Social Needs  . Financial resource strain: Not on file  . Food insecurity:    Worry: Not on file    Inability: Not on file  . Transportation needs:    Medical: Not on file    Non-medical: Not on file  Tobacco Use  . Smoking status: Never Smoker  . Smokeless tobacco: Never Used  Substance and Sexual Activity  . Alcohol use: No  . Drug use: No  . Sexual activity: Not Currently  Lifestyle  . Physical activity:    Days per week: Not on file    Minutes per session: Not on file  . Stress: Not on file  Relationships  . Social connections:    Talks on phone: Not on file    Gets together: Not on file    Attends religious service: Not on file    Active member of club or organization: Not on file    Attends meetings of clubs or organizations: Not on file    Relationship status: Not on file  Other Topics Concern  . Not on file  Social History Narrative  . Not on file    Outpatient Encounter Medications as of 11/26/2017  Medication Sig  . aspirin-acetaminophen-caffeine (EXCEDRIN MIGRAINE) 250-250-65 MG tablet Take 1 tablet by mouth every 6 (six) hours as needed for headache.  . Cholecalciferol (VITAMIN D PO)  Take by mouth.  . cyclobenzaprine (FLEXERIL) 10 MG tablet Take 1 tablet (10 mg total) by mouth 3 (three) times daily as needed for muscle spasms.  . ferrous sulfate 325 (65 FE) MG tablet Take 1 tablet (325 mg total) by mouth daily with breakfast.  . medroxyPROGESTERone (PROVERA) 10 MG tablet Take 1 tablet by mouth daily. X 14 days.  . meloxicam (MOBIC) 15 MG tablet TAKE 1 TABLET(15 MG) BY MOUTH DAILY  . metFORMIN (GLUCOPHAGE-XR) 500 MG 24 hr tablet TAKE 1 TABLET(500 MG) BY MOUTH DAILY WITH BREAKFAST  . Multiple Vitamins-Minerals (MULTIVITAMIN ADULTS PO) Take by mouth.  . Probiotic Product (PROBIOTIC DAILY PO) Take by mouth.   No facility-administered encounter medications on file as of 11/26/2017.     Activities of Daily Living In your present state of health, do you have any difficulty performing the following activities: 11/26/2017 05/28/2017  Hearing? N N  Vision? N N  Difficulty concentrating or making decisions? N N  Walking or climbing stairs? N N  Dressing or bathing? N N  Doing errands, shopping? N N  Preparing Food and eating ? N -  Using the Toilet? N -  In the past six months, have you accidently leaked urine? N -  Do you have problems with loss of bowel control? N -  Managing your Medications? N -  Managing your Finances? N -  Housekeeping or managing your Housekeeping? N -  Some recent data might be hidden    Patient Care Team: Midge Minium, MD as PCP - General (Family Medicine) Sabino Snipes, MD as Referring Physician (General Surgery) Tyson Dense, MD as Consulting Physician (Obstetrics and Gynecology)    Assessment:   This is a routine wellness examination for [name].  Exercise Activities and Dietary recommendations Current Exercise Habits: Home exercise routine, Type of exercise: walking;Other - see comments(swimming), Time (Minutes): 30, Frequency (Times/Week): 4, Weekly Exercise (Minutes/Week): 120, Exercise limited by: None identified   Diet  (meal preparation, eat out, water intake, caffeinated beverages, dairy products, fruits and vegetables): Drinks water and [employer]  Breakfast: Protein shakes Lunch: salads with protein Dinner: protein and vegetables   No fried foods.   Goals    . Weight (lb) < 275 lb (124.7 kg)     Lose weight by continuing WLS plan.        Fall Risk Fall Risk  11/26/2017 06/19/2016  Falls in the past year? Yes No  Number falls in past yr: 2 or  more -  Injury with Fall? No -  Follow up Falls prevention discussed -     Depression Screen PHQ 2/9 Scores 11/26/2017 05/28/2017 06/19/2016 04/09/2016  PHQ - 2 Score 0 0 0 0  PHQ- 9 Score 6 0 0 0     Cognitive Function       Ad8 score reviewed for issues:  Issues making decisions: no  Less interest in hobbies / activities: no  Repeats questions, stories (family complaining): no  Trouble using ordinary gadgets (microwave, computer, phone): no  Forgets the month or year: no  Mismanaging finances: no  Remembering appts: no  Daily problems with thinking and/or memory: no Ad8 score is=0     Immunization History  Administered Date(s) Administered  . Influenza,inj,Quad PF,6+ Mos 03/20/2016, 05/28/2017  . Tdap 06/19/2016    Screening Tests Health Maintenance  Topic Date Due  . INFLUENZA VACCINE  12/25/2017  . PAP SMEAR  07/11/2019  . TETANUS/TDAP  06/19/2026  . HIV Screening  Completed       Plan:    Bring a copy of your living will and/or healthcare power of attorney to your next office visit.  I have personally reviewed and noted the following in the patient's chart:   . Medical and social history . Use of alcohol, tobacco or illicit drugs  . Current medications and supplements . Functional ability and status . Nutritional status . Physical activity . Advanced directives . List of other physicians . Hospitalizations, surgeries, and ER visits in previous 12 months . Vitals . Screenings to include cognitive,  depression, and falls . Referrals and appointments  In addition, I have reviewed and discussed with patient certain preventive protocols, quality metrics, and best practice recommendations. A written personalized care plan for preventive services as well as general preventive health recommendations were provided to patient.     Gerilyn Nestle, RN  11/26/2017  Reviewed documentation provided by RN and agree w/ above.  Annye Asa, MD

## 2017-11-26 ENCOUNTER — Other Ambulatory Visit: Payer: Self-pay

## 2017-11-26 ENCOUNTER — Ambulatory Visit (INDEPENDENT_AMBULATORY_CARE_PROVIDER_SITE_OTHER): Payer: Medicare Other

## 2017-11-26 ENCOUNTER — Encounter: Payer: Self-pay | Admitting: Family Medicine

## 2017-11-26 ENCOUNTER — Ambulatory Visit (INDEPENDENT_AMBULATORY_CARE_PROVIDER_SITE_OTHER): Payer: Medicare Other | Admitting: Family Medicine

## 2017-11-26 DIAGNOSIS — R6889 Other general symptoms and signs: Secondary | ICD-10-CM

## 2017-11-26 DIAGNOSIS — G4733 Obstructive sleep apnea (adult) (pediatric): Secondary | ICD-10-CM

## 2017-11-26 DIAGNOSIS — Z0000 Encounter for general adult medical examination without abnormal findings: Secondary | ICD-10-CM

## 2017-11-26 DIAGNOSIS — Z9989 Dependence on other enabling machines and devices: Secondary | ICD-10-CM | POA: Diagnosis not present

## 2017-11-26 LAB — LIPID PANEL
Cholesterol: 114 mg/dL (ref 0–200)
HDL: 33.5 mg/dL — ABNORMAL LOW (ref >39.00)
LDL Cholesterol: 45 mg/dL (ref 0–99)
NONHDL: 80.72
TRIGLYCERIDES: 180 mg/dL — AB (ref 0.0–149.0)
Total CHOL/HDL Ratio: 3
VLDL: 36 mg/dL (ref 0.0–40.0)

## 2017-11-26 LAB — CBC WITH DIFFERENTIAL/PLATELET
Basophils Absolute: 0.7 10*3/uL — ABNORMAL HIGH (ref 0.0–0.1)
Basophils Relative: 6.3 % — ABNORMAL HIGH (ref 0.0–3.0)
EOS ABS: 0.3 10*3/uL (ref 0.0–0.7)
Eosinophils Relative: 2.4 % (ref 0.0–5.0)
HCT: 35.3 % — ABNORMAL LOW (ref 36.0–46.0)
HEMOGLOBIN: 11.2 g/dL — AB (ref 12.0–15.0)
LYMPHS ABS: 2.5 10*3/uL (ref 0.7–4.0)
Lymphocytes Relative: 21.9 % (ref 12.0–46.0)
MCHC: 31.7 g/dL (ref 30.0–36.0)
MCV: 71 fl — ABNORMAL LOW (ref 78.0–100.0)
MONO ABS: 0.5 10*3/uL (ref 0.1–1.0)
Monocytes Relative: 4 % (ref 3.0–12.0)
NEUTROS PCT: 65.4 % (ref 43.0–77.0)
Neutro Abs: 7.5 10*3/uL (ref 1.4–7.7)
PLATELETS: 230 10*3/uL (ref 150.0–400.0)
RBC: 4.98 Mil/uL (ref 3.87–5.11)
RDW: 20.3 % — ABNORMAL HIGH (ref 11.5–15.5)
WBC: 11.5 10*3/uL — AB (ref 4.0–10.5)

## 2017-11-26 LAB — HEPATIC FUNCTION PANEL
ALT: 13 U/L (ref 0–35)
AST: 13 U/L (ref 0–37)
Albumin: 3.8 g/dL (ref 3.5–5.2)
Alkaline Phosphatase: 74 U/L (ref 39–117)
BILIRUBIN DIRECT: 0.1 mg/dL (ref 0.0–0.3)
TOTAL PROTEIN: 6.7 g/dL (ref 6.0–8.3)
Total Bilirubin: 0.7 mg/dL (ref 0.2–1.2)

## 2017-11-26 LAB — BASIC METABOLIC PANEL
BUN: 11 mg/dL (ref 6–23)
CHLORIDE: 107 meq/L (ref 96–112)
CO2: 25 meq/L (ref 19–32)
CREATININE: 0.51 mg/dL (ref 0.40–1.20)
Calcium: 9.1 mg/dL (ref 8.4–10.5)
GFR: 141.2 mL/min (ref >60.00)
Glucose, Bld: 100 mg/dL — ABNORMAL HIGH (ref 70–99)
Potassium: 4.5 mEq/L (ref 3.5–5.1)
Sodium: 140 mEq/L (ref 135–145)

## 2017-11-26 LAB — TSH: TSH: 1.9 u[IU]/mL (ref 0.35–4.50)

## 2017-11-26 NOTE — Assessment & Plan Note (Signed)
Deteriorated.  Pt has gained 10 lbs since last visit.  She is walking daily and attempting to swim but notes cold intolerance.  She is now following a meal plan w/ Dr Valetta Close at [facility] w/ the plan of having gastric sleeve in September.    Check labs to risk stratify.  Will follow.

## 2017-11-26 NOTE — Assessment & Plan Note (Signed)
Pt has not seen anyone regarding her CPAP in years.  Referral to pulmonary placed.

## 2017-11-26 NOTE — Patient Instructions (Signed)
Follow up in 6 months to recheck weight loss progress We'll notify you of your lab results and make any changes if needed Continue to work on healthy diet and regular exercise- you can do it! We'll call you with your Pulmonary appt Call with any questions or concerns Have a great summer!!

## 2017-11-26 NOTE — Progress Notes (Signed)
   Subjective:    Patient ID: Shellia Cleverly, female    DOB: 10-22-1975, 42 y.o.   MRN: [id number]  HPI OSA- was seeing Neurology for CPAP years ago but is now going through the bariatric surgery work up and they recommended she establish w/ someone who can adjust settings as she loses weight.  Obesity- ongoing issue for pt.  She has gained 10 lbs in 6 months.  Pt is doing the Gastric Sleeve in September.  Walking daily, attempted swimming but finds herself very cold.  Pt is currently eating 1 shake, a small meal, and a regular meal.  Next month is starting a no fruit, no sugar diet w/ 2 shakes and 1 small meal.  Pt is doing this process w/ [facility] Dr Barrie Lyme.  No CP, SOB, HAs, visual changes, abd pain, N/V.   Review of Systems For ROS see HPI     Objective:   Physical Exam  Constitutional: She is oriented to person, place, and time. She appears well-developed and well-nourished. No distress.  Morbidly obese  HENT:  Head: Normocephalic and atraumatic.  Eyes: Pupils are equal, round, and reactive to light. Conjunctivae and EOM are normal.  Neck: Normal range of motion. Neck supple. No thyromegaly present.  Cardiovascular: Normal rate, regular rhythm, normal heart sounds and intact distal pulses.  No murmur heard. Pulmonary/Chest: Effort normal and breath sounds normal. No respiratory distress.  Abdominal: Soft. She exhibits no distension. There is no tenderness.  Musculoskeletal: She exhibits no edema.  Lymphadenopathy:    She has no cervical adenopathy.  Neurological: She is alert and oriented to person, place, and time.  Skin: Skin is warm and dry.  Psychiatric: She has a normal mood and affect. Her behavior is normal.  Vitals reviewed.         Assessment & Plan:

## 2017-11-26 NOTE — Patient Instructions (Addendum)
Bring a copy of your living will and/or healthcare power of attorney to your next office visit.  Health Maintenance, Female Adopting a healthy lifestyle and getting preventive care can go a long way to promote health and wellness. Talk with your health care provider about what schedule of regular examinations is right for you. This is a good chance for you to check in with your provider about disease prevention and staying healthy. In between checkups, there are plenty of things you can do on your own. Experts have done a lot of research about which lifestyle changes and preventive measures are most likely to keep you healthy. Ask your health care provider for more information. Weight and diet Eat a healthy diet  Be sure to include plenty of vegetables, fruits, low-fat dairy products, and lean protein.  Do not eat a lot of foods high in solid fats, added sugars, or salt.  Get regular exercise. This is one of the most important things you can do for your health. ? Most adults should exercise for at least 150 minutes each week. The exercise should increase your heart rate and make you sweat (moderate-intensity exercise). ? Most adults should also do strengthening exercises at least twice a week. This is in addition to the moderate-intensity exercise.  Maintain a healthy weight  Body mass index (BMI) is a measurement that can be used to identify possible weight problems. It estimates body fat based on height and weight. Your health care provider can help determine your BMI and help you achieve or maintain a healthy weight.  For females 20 years of age and older: ? A BMI below 18.5 is considered underweight. ? A BMI of 18.5 to 24.9 is normal. ? A BMI of 25 to 29.9 is considered overweight. ? A BMI of 30 and above is considered obese.  Watch levels of cholesterol and blood lipids  You should start having your blood tested for lipids and cholesterol at 41 years of age, then have this test every  5 years.  You may need to have your cholesterol levels checked more often if: ? Your lipid or cholesterol levels are high. ? You are older than 41 years of age. ? You are at high risk for heart disease.  Cancer screening Lung Cancer  Lung cancer screening is recommended for adults 55-80 years old who are at high risk for lung cancer because of a history of smoking.  A yearly low-dose CT scan of the lungs is recommended for people who: ? Currently smoke. ? Have quit within the past 15 years. ? Have at least a 30-pack-year history of smoking. A pack year is smoking an average of one pack of cigarettes a day for 1 year.  Yearly screening should continue until it has been 15 years since you quit.  Yearly screening should stop if you develop a health problem that would prevent you from having lung cancer treatment.  Breast Cancer  Practice breast self-awareness. This means understanding how your breasts normally appear and feel.  It also means doing regular breast self-exams. Let your health care provider know about any changes, no matter how small.  If you are in your 20s or 30s, you should have a clinical breast exam (CBE) by a health care provider every 1-3 years as part of a regular health exam.  If you are 40 or older, have a CBE every year. Also consider having a breast X-ray (mammogram) every year.  If you have a family history of   breast cancer, talk to your health care provider about genetic screening.  If you are at high risk for breast cancer, talk to your health care provider about having an MRI and a mammogram every year.  Breast cancer gene (BRCA) assessment is recommended for women who have family members with BRCA-related cancers. BRCA-related cancers include: ? Breast. ? Ovarian. ? Tubal. ? Peritoneal cancers.  Results of the assessment will determine the need for genetic counseling and BRCA1 and BRCA2 testing.  Cervical Cancer Your health care provider may  recommend that you be screened regularly for cancer of the pelvic organs (ovaries, uterus, and vagina). This screening involves a pelvic examination, including checking for microscopic changes to the surface of your cervix (Pap test). You may be encouraged to have this screening done every 3 years, beginning at age 21.  For women ages 30-65, health care providers may recommend pelvic exams and Pap testing every 3 years, or they may recommend the Pap and pelvic exam, combined with testing for human papilloma virus (HPV), every 5 years. Some types of HPV increase your risk of cervical cancer. Testing for HPV may also be done on women of any age with unclear Pap test results.  Other health care providers may not recommend any screening for nonpregnant women who are considered low risk for pelvic cancer and who do not have symptoms. Ask your health care provider if a screening pelvic exam is right for you.  If you have had past treatment for cervical cancer or a condition that could lead to cancer, you need Pap tests and screening for cancer for at least 20 years after your treatment. If Pap tests have been discontinued, your risk factors (such as having a new sexual partner) need to be reassessed to determine if screening should resume. Some women have medical problems that increase the chance of getting cervical cancer. In these cases, your health care provider may recommend more frequent screening and Pap tests.  Colorectal Cancer  This type of cancer can be detected and often prevented.  Routine colorectal cancer screening usually begins at 41 years of age and continues through 41 years of age.  Your health care provider may recommend screening at an earlier age if you have risk factors for colon cancer.  Your health care provider may also recommend using home test kits to check for hidden blood in the stool.  A small camera at the end of a tube can be used to examine your colon directly  (sigmoidoscopy or colonoscopy). This is done to check for the earliest forms of colorectal cancer.  Routine screening usually begins at age 50.  Direct examination of the colon should be repeated every 5-10 years through 41 years of age. However, you may need to be screened more often if early forms of precancerous polyps or small growths are found.  Skin Cancer  Check your skin from head to toe regularly.  Tell your health care provider about any new moles or changes in moles, especially if there is a change in a mole's shape or color.  Also tell your health care provider if you have a mole that is larger than the size of a pencil eraser.  Always use sunscreen. Apply sunscreen liberally and repeatedly throughout the day.  Protect yourself by wearing long sleeves, pants, a wide-brimmed hat, and sunglasses whenever you are outside.  Heart disease, diabetes, and high blood pressure  High blood pressure causes heart disease and increases the risk of stroke. High blood   pressure is more likely to develop in: ? People who have blood pressure in the high end of the normal range (130-139/85-89 mm Hg). ? People who are overweight or obese. ? People who are African American.  If you are 60-67 years of age, have your blood pressure checked every 3-5 years. If you are 95 years of age or older, have your blood pressure checked every year. You should have your blood pressure measured twice-once when you are at a hospital or clinic, and once when you are not at a hospital or clinic. Record the average of the two measurements. To check your blood pressure when you are not at a hospital or clinic, you can use: ? An automated blood pressure machine at a pharmacy. ? A home blood pressure monitor.  If you are between 59 years and 32 years old, ask your health care provider if you should take aspirin to prevent strokes.  Have regular diabetes screenings. This involves taking a blood sample to check your  fasting blood sugar level. ? If you are at a normal weight and have a low risk for diabetes, have this test once every three years after 41 years of age. ? If you are overweight and have a high risk for diabetes, consider being tested at a younger age or more often. Preventing infection Hepatitis B  If you have a higher risk for hepatitis B, you should be screened for this virus. You are considered at high risk for hepatitis B if: ? You were born in a country where hepatitis B is common. Ask your health care provider which countries are considered high risk. ? Your parents were born in a high-risk country, and you have not been immunized against hepatitis B (hepatitis B vaccine). ? You have HIV or AIDS. ? You use needles to inject street drugs. ? You live with someone who has hepatitis B. ? You have had sex with someone who has hepatitis B. ? You get hemodialysis treatment. ? You take certain medicines for conditions, including cancer, organ transplantation, and autoimmune conditions.  Hepatitis C  Blood testing is recommended for: ? Everyone born from [birth year] through 1964. ? Anyone with known risk factors for hepatitis C.  Sexually transmitted infections (STIs)  You should be screened for sexually transmitted infections (STIs) including gonorrhea and chlamydia if: ? You are sexually active and are younger than 41 years of age. ? You are older than 41 years of age and your health care provider tells you that you are at risk for this type of infection. ? Your sexual activity has changed since you were last screened and you are at an increased risk for chlamydia or gonorrhea. Ask your health care provider if you are at risk.  If you do not have HIV, but are at risk, it may be recommended that you take a prescription medicine daily to prevent HIV infection. This is called pre-exposure prophylaxis (PrEP). You are considered at risk if: ? You are sexually active and do not regularly use condoms  or know the HIV status of your partner(s). ? You take drugs by injection. ? You are sexually active with a partner who has HIV.  Talk with your health care provider about whether you are at high risk of being infected with HIV. If you choose to begin PrEP, you should first be tested for HIV. You should then be tested every 3 months for as long as you are taking PrEP. Pregnancy  If you are premenopausal  and you may become pregnant, ask your health care provider about preconception counseling.  If you may become pregnant, take 400 to 800 micrograms (mcg) of folic acid every day.  If you want to prevent pregnancy, talk to your health care provider about birth control (contraception). Osteoporosis and menopause  Osteoporosis is a disease in which the bones lose minerals and strength with aging. This can result in serious bone fractures. Your risk for osteoporosis can be identified using a bone density scan.  If you are 83 years of age or older, or if you are at risk for osteoporosis and fractures, ask your health care provider if you should be screened.  Ask your health care provider whether you should take a calcium or vitamin D supplement to lower your risk for osteoporosis.  Menopause may have certain physical symptoms and risks.  Hormone replacement therapy may reduce some of these symptoms and risks. Talk to your health care provider about whether hormone replacement therapy is right for you. Follow these instructions at home:  Schedule regular health, dental, and eye exams.  Stay current with your immunizations.  Do not use any tobacco products including cigarettes, chewing tobacco, or electronic cigarettes.  If you are pregnant, do not drink alcohol.  If you are breastfeeding, limit how much and how often you drink alcohol.  Limit alcohol intake to no more than 1 drink per day for nonpregnant women. One drink equals 12 ounces of beer, 5 ounces of wine, or 1 ounces of hard  liquor.  Do not use street drugs.  Do not share needles.  Ask your health care provider for help if you need support or information about quitting drugs.  Tell your health care provider if you often feel depressed.  Tell your health care provider if you have ever been abused or do not feel safe at home. This information is not intended to replace advice given to you by your health care provider. Make sure you discuss any questions you have with your health care provider. Document Released: 11/26/2010 Document Revised: 10/19/2015 Document Reviewed: 02/14/2015 Elsevier Interactive Patient Education  [employer].

## 2017-11-28 ENCOUNTER — Other Ambulatory Visit: Payer: Self-pay

## 2017-11-28 DIAGNOSIS — D72824 Basophilia: Secondary | ICD-10-CM

## 2017-12-08 ENCOUNTER — Other Ambulatory Visit (INDEPENDENT_AMBULATORY_CARE_PROVIDER_SITE_OTHER): Payer: Medicare Other

## 2017-12-08 DIAGNOSIS — D72824 Basophilia: Secondary | ICD-10-CM | POA: Diagnosis not present

## 2017-12-08 LAB — CBC WITH DIFFERENTIAL/PLATELET
BASOS PCT: 0.7 % (ref 0.0–3.0)
Basophils Absolute: 0.1 10*3/uL (ref 0.0–0.1)
EOS PCT: 2.6 % (ref 0.0–5.0)
Eosinophils Absolute: 0.2 10*3/uL (ref 0.0–0.7)
HCT: 36.4 % (ref 36.0–46.0)
Hemoglobin: 11.6 g/dL — ABNORMAL LOW (ref 12.0–15.0)
LYMPHS ABS: 2.1 10*3/uL (ref 0.7–4.0)
Lymphocytes Relative: 24.6 % (ref 12.0–46.0)
MCHC: 31.9 g/dL (ref 30.0–36.0)
MCV: 72.3 fl — ABNORMAL LOW (ref 78.0–100.0)
MONO ABS: 0.4 10*3/uL (ref 0.1–1.0)
Monocytes Relative: 4.3 % (ref 3.0–12.0)
NEUTROS PCT: 67.8 % (ref 43.0–77.0)
Neutro Abs: 5.8 10*3/uL (ref 1.4–7.7)
Platelets: 200 10*3/uL (ref 150.0–400.0)
RBC: 5.04 Mil/uL (ref 3.87–5.11)
RDW: 22.7 % — AB (ref 11.5–15.5)
WBC: 8.6 10*3/uL (ref 4.0–10.5)

## 2017-12-09 ENCOUNTER — Telehealth: Payer: Self-pay | Admitting: Family Medicine

## 2017-12-09 DIAGNOSIS — G932 Benign intracranial hypertension: Secondary | ICD-10-CM | POA: Diagnosis not present

## 2017-12-09 DIAGNOSIS — Z0279 Encounter for issue of other medical certificate: Secondary | ICD-10-CM

## 2017-12-09 DIAGNOSIS — E8881 Metabolic syndrome: Secondary | ICD-10-CM | POA: Diagnosis not present

## 2017-12-09 DIAGNOSIS — Z6841 Body Mass Index (BMI) 40.0 and over, adult: Secondary | ICD-10-CM | POA: Diagnosis not present

## 2017-12-09 DIAGNOSIS — E782 Mixed hyperlipidemia: Secondary | ICD-10-CM | POA: Diagnosis not present

## 2017-12-09 DIAGNOSIS — E559 Vitamin D deficiency, unspecified: Secondary | ICD-10-CM | POA: Diagnosis not present

## 2017-12-09 DIAGNOSIS — Z9989 Dependence on other enabling machines and devices: Secondary | ICD-10-CM | POA: Diagnosis not present

## 2017-12-09 DIAGNOSIS — F419 Anxiety disorder, unspecified: Secondary | ICD-10-CM | POA: Diagnosis not present

## 2017-12-09 DIAGNOSIS — F259 Schizoaffective disorder, unspecified: Secondary | ICD-10-CM | POA: Diagnosis not present

## 2017-12-09 DIAGNOSIS — K900 Celiac disease: Secondary | ICD-10-CM | POA: Diagnosis not present

## 2017-12-09 DIAGNOSIS — E611 Iron deficiency: Secondary | ICD-10-CM | POA: Diagnosis not present

## 2017-12-09 DIAGNOSIS — G4733 Obstructive sleep apnea (adult) (pediatric): Secondary | ICD-10-CM | POA: Diagnosis not present

## 2017-12-09 NOTE — Telephone Encounter (Signed)
Pt dropped off a surgery support letter to be completed and then faxed back to [facility], placed in bin upfront w/charge sheet.

## 2017-12-10 NOTE — Telephone Encounter (Signed)
Forms have been faxed 

## 2017-12-10 NOTE — Telephone Encounter (Signed)
Form completed and placed in basket  

## 2017-12-12 DIAGNOSIS — Z7189 Other specified counseling: Secondary | ICD-10-CM | POA: Diagnosis not present

## 2017-12-12 DIAGNOSIS — F54 Psychological and behavioral factors associated with disorders or diseases classified elsewhere: Secondary | ICD-10-CM | POA: Diagnosis not present

## 2017-12-12 DIAGNOSIS — Z6841 Body Mass Index (BMI) 40.0 and over, adult: Secondary | ICD-10-CM | POA: Diagnosis not present

## 2017-12-12 NOTE — Telephone Encounter (Signed)
I have faxed form again.

## 2017-12-12 NOTE — Telephone Encounter (Signed)
Patient states that [facility] did not receive the fax. Please send again

## 2017-12-23 DIAGNOSIS — Z01818 Encounter for other preprocedural examination: Secondary | ICD-10-CM | POA: Diagnosis not present

## 2017-12-23 DIAGNOSIS — E559 Vitamin D deficiency, unspecified: Secondary | ICD-10-CM | POA: Diagnosis not present

## 2017-12-25 ENCOUNTER — Encounter: Payer: Self-pay | Admitting: Pulmonary Disease

## 2017-12-25 ENCOUNTER — Ambulatory Visit (INDEPENDENT_AMBULATORY_CARE_PROVIDER_SITE_OTHER): Payer: Medicare Other | Admitting: Pulmonary Disease

## 2017-12-25 VITALS — BP 120/74 | HR 64 | Ht 68.0 in | Wt 306.2 lb

## 2017-12-25 DIAGNOSIS — E282 Polycystic ovarian syndrome: Secondary | ICD-10-CM

## 2017-12-25 DIAGNOSIS — G4733 Obstructive sleep apnea (adult) (pediatric): Secondary | ICD-10-CM | POA: Diagnosis not present

## 2017-12-25 NOTE — Progress Notes (Signed)
Michaela Norris    [id number]    [birth year]  Primary Care Physician:Tabori, Aundra Millet, MD  Referring Physician: Midge Minium, MD [address] [place], [place] [address]  Chief complaint:   Patient with known severe obstructive sleep apnea Has been on CPAP therapy since 2014, well-controlled, very compliant Scheduled for bariatric surgery  HPI:  Diagnosed with severe obstructive sleep apnea in 2014, was initially started on CPAP of 11 which she was not tolerating very well subsequently switched to auto titrating CPAP 5-15 Download is notable for median pressure of 8.1 95th percentile pressure of 9.6 100% compliant, residual AHI of 0.1 She is following up regularly with bariatric surgery and is scheduled for surgery this August She is losing weight already down almost 50 pounds through recent follow-up  Occupation: No significant contributory history Exposures: no Significant exposures Smoking history: Never smoker Travel history: Recent travels Relevant family history: No family history of obstructive sleep apnea  Outpatient Encounter Medications as of 12/25/2017  Medication Sig  . Cholecalciferol (VITAMIN D PO) Take by mouth.  . ferrous sulfate 325 (65 FE) MG tablet Take 1 tablet (325 mg total) by mouth daily with breakfast.  . medroxyPROGESTERone (PROVERA) 10 MG tablet Take 1 tablet by mouth daily. X 14 days.  . Multiple Vitamins-Minerals (MULTIVITAMIN ADULTS PO) Take by mouth.  Derrill Memo ON 01/05/2018] aprepitant (EMEND) 40 MG capsule Take by mouth.  . levofloxacin (LEVAQUIN) 500 MG tablet Take one tab the night before surgery  . metoCLOPramide (REGLAN) 10 MG tablet Take one tab the morning of surgery then one tab every 6-8 hours as needed for nausea  . omeprazole (PRILOSEC) 40 MG capsule Take by mouth.  . ondansetron (ZOFRAN-ODT) 8 MG disintegrating tablet Let one tablet melt on your tongue every 6 hours as needed for nausea  . [DISCONTINUED]  aspirin-acetaminophen-caffeine (EXCEDRIN MIGRAINE) 250-250-65 MG tablet Take 1 tablet by mouth every 6 (six) hours as needed for headache.  . [DISCONTINUED] cyclobenzaprine (FLEXERIL) 10 MG tablet Take 1 tablet (10 mg total) by mouth 3 (three) times daily as needed for muscle spasms.  . [DISCONTINUED] meloxicam (MOBIC) 15 MG tablet TAKE 1 TABLET(15 MG) BY MOUTH DAILY  . [DISCONTINUED] metFORMIN (GLUCOPHAGE-XR) 500 MG 24 hr tablet TAKE 1 TABLET(500 MG) BY MOUTH DAILY WITH BREAKFAST  . [DISCONTINUED] Probiotic Product (PROBIOTIC DAILY PO) Take by mouth.   No facility-administered encounter medications on file as of 12/25/2017.     Allergies as of 12/25/2017 - Review Complete 12/25/2017  Allergen Reaction Noted  . Latex Itching and Rash 04/02/2011  . Penicillins Swelling 03/09/2013  . Tegretol [carbamazepine] Rash 04/02/2011  . Vicodin [hydrocodone-acetaminophen] Rash 12/21/2011    Past Medical History:  Diagnosis Date  . Anxiety   . Depression   . Headache(784.0)   . High blood pressure    off meds at this time  . Hyperlipidemia   . Mania ([place])   . Obesity   . OSA on CPAP 03/18/2013  . Psychosis ([place])   . PTSD (post-traumatic stress disorder)   . Schizoaffective disorder     Past Surgical History:  Procedure Laterality Date  . broken ankle  2000  . CHOLECYSTECTOMY    . FRACTURE SURGERY      Family History  Problem Relation Age of Onset  . Alcohol abuse Mother   . Depression Mother   . Alcohol abuse Brother   . Suicidality Maternal Grandmother   . Anorexia nervosa  Maternal Grandmother   . Asperger's syndrome Daughter   . Schizophrenia Father   . Brain cancer Father   . Early death Father   . Allergic rhinitis Neg Hx   . Angioedema Neg Hx   . Asthma Neg Hx   . Eczema Neg Hx   . Immunodeficiency Neg Hx   . Urticaria Neg Hx     Social History   Socioeconomic History  . Marital status: Single    Spouse name: Not on file  . Number of children: Not on file  .  Years of education: Not on file  . Highest education level: Not on file  Occupational History  . Not on file  Social Needs  . Financial resource strain: Not on file  . Food insecurity:    Worry: Not on file    Inability: Not on file  . Transportation needs:    Medical: Not on file    Non-medical: Not on file  Tobacco Use  . Smoking status: Never Smoker  . Smokeless tobacco: Never Used  Substance and Sexual Activity  . Alcohol use: No  . Drug use: No  . Sexual activity: Not Currently  Lifestyle  . Physical activity:    Days per week: Not on file    Minutes per session: Not on file  . Stress: Not on file  Relationships  . Social connections:    Talks on phone: Not on file    Gets together: Not on file    Attends religious service: Not on file    Active member of club or organization: Not on file    Attends meetings of clubs or organizations: Not on file    Relationship status: Not on file  . Intimate partner violence:    Fear of current or ex partner: Not on file    Emotionally abused: Not on file    Physically abused: Not on file    Forced sexual activity: Not on file  Other Topics Concern  . Not on file  Social History Narrative  . Not on file    Review of Systems  Constitutional: Negative for fatigue.  HENT: Negative.   Eyes: Negative.   Respiratory: Negative.  Negative for cough and choking.   Cardiovascular: Negative.  Negative for chest pain and leg swelling.  Endocrine: Negative.  Negative for cold intolerance and heat intolerance.  Musculoskeletal: Negative.   Skin: Negative.   Allergic/Immunologic: Negative.   Neurological: Negative.   Hematological: Negative.   Psychiatric/Behavioral: Negative.     Vitals:   12/25/17 0900  BP: 120/74  Pulse: 64  SpO2: 96%     Physical Exam  Constitutional: She is oriented to person, place, and time. She appears well-developed and well-nourished. No distress.  Obese  HENT:  Head: Normocephalic.  Eyes:  Pupils are equal, round, and reactive to light. Conjunctivae are normal. Right eye exhibits no discharge. Left eye exhibits no discharge.  Neck: Neck supple. No tracheal deviation present. No thyromegaly present.  Cardiovascular: Normal rate and regular rhythm. Exam reveals no friction rub.  No murmur heard. Pulmonary/Chest: Breath sounds normal. No respiratory distress. She has no wheezes. She has no rales. She exhibits no tenderness.  Abdominal: Soft. Bowel sounds are normal.  Obese,  pendulous  Musculoskeletal: Normal range of motion. She exhibits no edema or deformity.  Neurological: She is alert and oriented to person, place, and time. No cranial nerve deficit. Coordination normal.  Skin: Skin is warm and dry. No erythema.  Psychiatric: She  has a normal mood and affect. Her behavior is normal.     Data Reviewed: Polysomnogram noted on 01/13/2013 Patient was titrated to CPAP of 11, uses P 10 nasal pillows  Compliance data from 07/31/2017 to 08/29/2017 does reveal excellent compliance  Assessment:  Severe obstructive sleep apnea adequately treated with CPAP therapy On an auto titrating machine with very good compliance Morbid obesity-scheduled for bariatric surgery History of polycystic ovarian disease   Plan/Recommendations:  Encouraged continue CPAP use No changes to her current care We will see her back in 3 months We will follow-up with compliance reports  I do believe she will do well with surgery Unlikely to require any changes with CPAP therapy until very close to when she gets to her goal weight   Sherrilyn Rist MD [facility] 12/25/2017, 9:26 AM  CC: Michaela Minium, MD

## 2017-12-25 NOTE — Patient Instructions (Signed)
Severe obstructive sleep apnea adequately treated with auto titrating CPAP settings of 5-10 with a median pressure of 8.1 95th percentile of 9.6  As you have no significant symptoms today, your download showing adequate treatment of your obstructive sleep apnea  I feel you will tolerate surgery very well  We will see you back in 3 months  Call if any changes in symptoms No changes needed at present

## 2018-01-01 ENCOUNTER — Encounter: Payer: Self-pay | Admitting: Family Medicine

## 2018-01-01 DIAGNOSIS — F259 Schizoaffective disorder, unspecified: Secondary | ICD-10-CM | POA: Diagnosis not present

## 2018-01-01 DIAGNOSIS — E559 Vitamin D deficiency, unspecified: Secondary | ICD-10-CM | POA: Diagnosis not present

## 2018-01-01 DIAGNOSIS — K900 Celiac disease: Secondary | ICD-10-CM | POA: Diagnosis not present

## 2018-01-01 DIAGNOSIS — Z6841 Body Mass Index (BMI) 40.0 and over, adult: Secondary | ICD-10-CM | POA: Diagnosis not present

## 2018-01-01 DIAGNOSIS — G932 Benign intracranial hypertension: Secondary | ICD-10-CM | POA: Diagnosis not present

## 2018-01-01 DIAGNOSIS — G4733 Obstructive sleep apnea (adult) (pediatric): Secondary | ICD-10-CM | POA: Diagnosis not present

## 2018-01-01 DIAGNOSIS — E8881 Metabolic syndrome: Secondary | ICD-10-CM | POA: Diagnosis not present

## 2018-01-01 DIAGNOSIS — Z713 Dietary counseling and surveillance: Secondary | ICD-10-CM | POA: Diagnosis not present

## 2018-01-01 DIAGNOSIS — Z9989 Dependence on other enabling machines and devices: Secondary | ICD-10-CM | POA: Diagnosis not present

## 2018-01-05 DIAGNOSIS — G932 Benign intracranial hypertension: Secondary | ICD-10-CM | POA: Diagnosis present

## 2018-01-05 DIAGNOSIS — Z6841 Body Mass Index (BMI) 40.0 and over, adult: Secondary | ICD-10-CM | POA: Diagnosis not present

## 2018-01-05 DIAGNOSIS — E8881 Metabolic syndrome: Secondary | ICD-10-CM | POA: Diagnosis present

## 2018-01-05 DIAGNOSIS — F418 Other specified anxiety disorders: Secondary | ICD-10-CM | POA: Diagnosis present

## 2018-01-05 DIAGNOSIS — G4733 Obstructive sleep apnea (adult) (pediatric): Secondary | ICD-10-CM | POA: Diagnosis present

## 2018-01-05 DIAGNOSIS — K660 Peritoneal adhesions (postprocedural) (postinfection): Secondary | ICD-10-CM | POA: Diagnosis present

## 2018-01-05 DIAGNOSIS — Z91040 Latex allergy status: Secondary | ICD-10-CM | POA: Diagnosis not present

## 2018-01-05 DIAGNOSIS — Z7984 Long term (current) use of oral hypoglycemic drugs: Secondary | ICD-10-CM | POA: Diagnosis not present

## 2018-01-05 DIAGNOSIS — I10 Essential (primary) hypertension: Secondary | ICD-10-CM | POA: Diagnosis present

## 2018-01-05 DIAGNOSIS — E559 Vitamin D deficiency, unspecified: Secondary | ICD-10-CM | POA: Diagnosis not present

## 2018-01-05 DIAGNOSIS — F259 Schizoaffective disorder, unspecified: Secondary | ICD-10-CM | POA: Diagnosis present

## 2018-01-05 DIAGNOSIS — Z9989 Dependence on other enabling machines and devices: Secondary | ICD-10-CM | POA: Diagnosis not present

## 2018-01-05 DIAGNOSIS — K900 Celiac disease: Secondary | ICD-10-CM | POA: Diagnosis present

## 2018-01-05 DIAGNOSIS — E782 Mixed hyperlipidemia: Secondary | ICD-10-CM | POA: Diagnosis not present

## 2018-01-05 DIAGNOSIS — D509 Iron deficiency anemia, unspecified: Secondary | ICD-10-CM | POA: Diagnosis present

## 2018-01-05 DIAGNOSIS — E611 Iron deficiency: Secondary | ICD-10-CM | POA: Diagnosis not present

## 2018-01-07 DIAGNOSIS — N281 Cyst of kidney, acquired: Secondary | ICD-10-CM | POA: Diagnosis not present

## 2018-01-07 DIAGNOSIS — R109 Unspecified abdominal pain: Secondary | ICD-10-CM | POA: Diagnosis not present

## 2018-01-07 DIAGNOSIS — R1013 Epigastric pain: Secondary | ICD-10-CM | POA: Diagnosis not present

## 2018-01-07 DIAGNOSIS — Z9884 Bariatric surgery status: Secondary | ICD-10-CM | POA: Diagnosis not present

## 2018-01-07 DIAGNOSIS — R162 Hepatomegaly with splenomegaly, not elsewhere classified: Secondary | ICD-10-CM | POA: Diagnosis not present

## 2018-01-07 DIAGNOSIS — R112 Nausea with vomiting, unspecified: Secondary | ICD-10-CM | POA: Diagnosis not present

## 2018-01-07 DIAGNOSIS — Z886 Allergy status to analgesic agent status: Secondary | ICD-10-CM | POA: Diagnosis not present

## 2018-01-07 DIAGNOSIS — Z880 Allergy status to penicillin: Secondary | ICD-10-CM | POA: Diagnosis not present

## 2018-01-08 DIAGNOSIS — Z9889 Other specified postprocedural states: Secondary | ICD-10-CM | POA: Diagnosis not present

## 2018-01-08 DIAGNOSIS — R112 Nausea with vomiting, unspecified: Secondary | ICD-10-CM | POA: Diagnosis not present

## 2018-01-12 DIAGNOSIS — Z4802 Encounter for removal of sutures: Secondary | ICD-10-CM | POA: Diagnosis not present

## 2018-02-10 ENCOUNTER — Encounter: Payer: Self-pay | Admitting: Allergy

## 2018-02-10 DIAGNOSIS — Z903 Acquired absence of stomach [part of]: Secondary | ICD-10-CM | POA: Diagnosis not present

## 2018-02-10 DIAGNOSIS — K912 Postsurgical malabsorption, not elsewhere classified: Secondary | ICD-10-CM | POA: Diagnosis not present

## 2018-03-15 DIAGNOSIS — Z23 Encounter for immunization: Secondary | ICD-10-CM | POA: Diagnosis not present

## 2018-06-03 ENCOUNTER — Ambulatory Visit: Payer: Self-pay | Admitting: Family Medicine

## 2018-06-05 ENCOUNTER — Ambulatory Visit (INDEPENDENT_AMBULATORY_CARE_PROVIDER_SITE_OTHER): Payer: No Typology Code available for payment source | Admitting: Family Medicine

## 2018-06-05 ENCOUNTER — Encounter: Payer: Self-pay | Admitting: Family Medicine

## 2018-06-05 ENCOUNTER — Other Ambulatory Visit: Payer: Self-pay

## 2018-06-05 VITALS — BP 121/72 | HR 53 | Temp 98.1°F | Resp 16 | Ht 68.0 in | Wt 240.1 lb

## 2018-06-05 DIAGNOSIS — B369 Superficial mycosis, unspecified: Secondary | ICD-10-CM | POA: Diagnosis not present

## 2018-06-05 DIAGNOSIS — M545 Low back pain, unspecified: Secondary | ICD-10-CM | POA: Insufficient documentation

## 2018-06-05 DIAGNOSIS — Z6836 Body mass index (BMI) 36.0-36.9, adult: Secondary | ICD-10-CM

## 2018-06-05 DIAGNOSIS — E6609 Other obesity due to excess calories: Secondary | ICD-10-CM

## 2018-06-05 MED ORDER — METHOCARBAMOL 500 MG PO TABS: 500.0000 mg | ORAL_TABLET | Freq: Three times a day (TID) | ORAL | 1 refills | Status: DC | PRN

## 2018-06-05 MED ORDER — NYSTATIN 100000 UNIT/GM EX POWD: Freq: Three times a day (TID) | CUTANEOUS | 6 refills | Status: DC

## 2018-06-05 MED FILL — METHOCARBAMOL 500 MG TABS: 500 | 30 days supply | Qty: 90 | Fill #0

## 2018-06-05 MED FILL — NYSTATIN 100000 UNIT/GM POW: 100000 | 30 days supply | Qty: 60 | Fill #0

## 2018-06-05 NOTE — Patient Instructions (Signed)
Schedule your complete physical in 6 months No labs needed today! USE the Nystatin powder as needed for skin infections TAKE OTC Tylenol for back pain USE the Methocarbamol (Robaxin) for muscle spasms HEAT! If no improvement in back pain, let me know so we can find a PT that will work with [place]! Call with any questions or concerns I AM SO PROUD OF YOU!!! Happy New Year!  New Year, New You!!!

## 2018-06-05 NOTE — Assessment & Plan Note (Signed)
Pt reports back pain is worsening now that she has a large amount of loose abdominal skin pulling forward and straining her low back.  She is unable to take NSAIDs due to gastric sleeve.  Start Tylenol.  Methocarbamol prn.  Reviewed supportive care and red flags that should prompt return.  Pt expressed understanding and is in agreement w/ plan.

## 2018-06-05 NOTE — Assessment & Plan Note (Signed)
Pt reports this has been an issue for years due to her body habitus but recently worsening with all of her new loose skin after bariatric surgery.  Start topical Nystatin powder.  Pt to log how frequently this is occurring to further strengthen her case for skin removal surgery

## 2018-06-05 NOTE — Progress Notes (Signed)
   Subjective:    Patient ID: Michaela Norris, female    DOB: 1975-09-20, 43 y.o.   MRN: [id number]  HPI Obesity- pt is down 66 lbs since gastric sleeve ~5 months ago.  Exercising regularly- walking, hiking, recumbent elliptical, swimming.  No CP, SOB, HAs ,visual changes, abd pain, N/V.  LBP- unable to take NSAIDs due to gastric sleeve.  Pt reports remaining abdominal skin is heavy and pulling forward causing LBP and hip pain.  Pt had CT done after surgery showing LDDD at L1 and L5.  Pt has medication hangover w/ Flexeril.  Recurrent fungal skin infxns- pt now has large, hanging pannus due to considerable weight loss and loose skin.  Using Gold Bond to prevent infxns.  Review of Systems For ROS see HPI     Objective:   Physical Exam Constitutional:      General: She is not in acute distress.    Appearance: Normal appearance. She is well-developed. She is obese.  HENT:     Head: Normocephalic and atraumatic.  Eyes:     Conjunctiva/sclera: Conjunctivae normal.     Pupils: Pupils are equal, round, and reactive to light.  Neck:     Musculoskeletal: Normal range of motion and neck supple.     Thyroid: No thyromegaly.  Cardiovascular:     Rate and Rhythm: Normal rate and regular rhythm.     Heart sounds: Normal heart sounds. No murmur.  Pulmonary:     Effort: Pulmonary effort is normal. No respiratory distress.     Breath sounds: Normal breath sounds.  Abdominal:     General: There is no distension.     Palpations: Abdomen is soft.     Tenderness: There is no abdominal tenderness.  Lymphadenopathy:     Cervical: No cervical adenopathy.  Skin:    General: Skin is warm and dry.  Neurological:     Mental Status: She is alert and oriented to person, place, and time.  Psychiatric:        Behavior: Behavior normal.           Assessment & Plan:

## 2018-06-05 NOTE — Assessment & Plan Note (Signed)
Pt's no longer in the morbidly obese range.  Her BMI is now down to 36.51 after losing 66 lbs.  Applauded her weight loss efforts.  Will continue to follow.

## 2018-06-17 DIAGNOSIS — Z903 Acquired absence of stomach [part of]: Secondary | ICD-10-CM | POA: Diagnosis not present

## 2018-06-17 DIAGNOSIS — K912 Postsurgical malabsorption, not elsewhere classified: Secondary | ICD-10-CM | POA: Diagnosis not present

## 2018-06-23 ENCOUNTER — Encounter: Payer: Self-pay | Admitting: Family Medicine

## 2018-06-23 ENCOUNTER — Ambulatory Visit (INDEPENDENT_AMBULATORY_CARE_PROVIDER_SITE_OTHER): Payer: No Typology Code available for payment source | Admitting: Family Medicine

## 2018-06-23 ENCOUNTER — Other Ambulatory Visit: Payer: Self-pay

## 2018-06-23 VITALS — BP 120/70 | HR 71 | Temp 98.2°F | Resp 16 | Ht 68.0 in | Wt 234.0 lb

## 2018-06-23 DIAGNOSIS — K1120 Sialoadenitis, unspecified: Secondary | ICD-10-CM

## 2018-06-23 MED ORDER — TRAMADOL HCL 50 MG PO TABS: 50.0000 mg | ORAL_TABLET | Freq: Three times a day (TID) | ORAL | 0 refills | Status: DC | PRN

## 2018-06-23 MED FILL — traMADol HCL 50 MG TABS: 50 | 5 days supply | Qty: 15 | Fill #0

## 2018-06-23 NOTE — Patient Instructions (Signed)
We will call you tomorrow afternoon to see how things are going HEAT! Sour candies to stimulate saliva production Drink LOTS of fluids USE the Tramadol as needed for pain If symptoms dramatically change or worsen, let me know so we can call ENT Call with any questions or concerns Hang in there!!!

## 2018-06-23 NOTE — Progress Notes (Signed)
   Subjective:    Patient ID: Michaela Norris, female    DOB: [birth year], 43 y.o.   MRN: [id number]  HPI Facial swelling- L sided, started suddenly at 3pm yesterday.  Pt has hx of pain in salivary glands but never swelling.  Pain started at noon, swelling developed at 3pm.  Has had CT done previously.  Pt reports sxs are typically R sided, never L until yesterday.  No fevers.  No drainage into floor of mouth.  Heat improves pain.  No relief w/ tylenol- unable to take NSAIDs.  No use of anticholinergic medication.   Review of Systems For ROS see HPI     Objective:   Physical Exam Vitals signs reviewed.  Constitutional:      General: She is not in acute distress.    Appearance: Normal appearance. She is obese. She is not ill-appearing.  HENT:     Head:     Jaw: Pain on movement (on L side due to parotid swelling) present.     Salivary Glands: Left salivary gland is diffusely enlarged (parotid) and tender.     Mouth/Throat:     Mouth: Mucous membranes are moist.     Pharynx: Oropharynx is clear.     Comments: No palpable stone or abnormality of Wharton's duct Neurological:     General: No focal deficit present.     Mental Status: She is alert and oriented to person, place, and time.  Psychiatric:        Mood and Affect: Mood normal.        Behavior: Behavior normal.        Thought Content: Thought content normal.           Assessment & Plan:  Sialadenitis- new.  Pt w/ parotid swelling and TTP.  No palpable or obvious stone on exam today.  Stressed need for heat, parotid massage, sour candies.  Since pt is unable to take NSAIDs due to bariatric surgery, will give Tramadol.  If no improvement or worsening, pt will need ENT.  Pt expressed understanding and is in agreement w/ plan.

## 2018-06-24 ENCOUNTER — Encounter: Payer: Self-pay | Admitting: Family Medicine

## 2018-06-24 ENCOUNTER — Other Ambulatory Visit (HOSPITAL_COMMUNITY)
Admission: RE | Admit: 2018-06-24 | Discharge: 2018-06-24 | Disposition: A | Discharge status: Home / Self Care | Payer: No Typology Code available for payment source | Source: Ambulatory Visit | Attending: Family Medicine | Admitting: Family Medicine

## 2018-06-24 DIAGNOSIS — K1120 Sialoadenitis, unspecified: Secondary | ICD-10-CM | POA: Diagnosis not present

## 2018-06-25 ENCOUNTER — Other Ambulatory Visit: Payer: Self-pay | Admitting: Family Medicine

## 2018-06-25 DIAGNOSIS — K1120 Sialoadenitis, unspecified: Secondary | ICD-10-CM

## 2018-06-25 LAB — EPSTEIN-BARR VIRUS VCA ANTIBODY PANEL
EBV Early Antigen Ab, IgG: 54.9 U/mL — ABNORMAL HIGH (ref 0.0–8.9)
EBV NA IgG: 359 U/mL — ABNORMAL HIGH (ref 0.0–17.9)
EBV VCA IgG: >600 U/mL — ABNORMAL HIGH (ref 0.0–17.9)
EBV VCA IgM: <36 U/mL (ref 0.0–35.9)

## 2018-06-25 LAB — CMV IGM

## 2018-06-26 LAB — MUMPS ANTIBODY, IGM: Mumps IgM: <0.8 AU (ref 0.00–0.79)

## 2018-06-29 NOTE — Progress Notes (Signed)
Pt's labs are negative for mumps, negative for CMV, and show past mono infection w/ EBV.  This is all great news!

## 2018-06-29 NOTE — Progress Notes (Signed)
Called pt and LMOVM to inform of results.   [place] for [facility] to Discuss results / PCP recommendations / Schedule patient.

## 2018-07-10 DIAGNOSIS — G4733 Obstructive sleep apnea (adult) (pediatric): Secondary | ICD-10-CM | POA: Diagnosis not present

## 2018-07-10 DIAGNOSIS — Z903 Acquired absence of stomach [part of]: Secondary | ICD-10-CM | POA: Diagnosis not present

## 2018-07-10 DIAGNOSIS — K900 Celiac disease: Secondary | ICD-10-CM | POA: Diagnosis not present

## 2018-07-10 DIAGNOSIS — F259 Schizoaffective disorder, unspecified: Secondary | ICD-10-CM | POA: Diagnosis not present

## 2018-07-10 DIAGNOSIS — K912 Postsurgical malabsorption, not elsewhere classified: Secondary | ICD-10-CM | POA: Diagnosis not present

## 2018-07-10 DIAGNOSIS — E782 Mixed hyperlipidemia: Secondary | ICD-10-CM | POA: Diagnosis not present

## 2018-07-10 DIAGNOSIS — G932 Benign intracranial hypertension: Secondary | ICD-10-CM | POA: Diagnosis not present

## 2018-07-10 DIAGNOSIS — F419 Anxiety disorder, unspecified: Secondary | ICD-10-CM | POA: Diagnosis not present

## 2018-07-10 DIAGNOSIS — E611 Iron deficiency: Secondary | ICD-10-CM | POA: Diagnosis not present

## 2018-07-10 DIAGNOSIS — N924 Excessive bleeding in the premenopausal period: Secondary | ICD-10-CM | POA: Diagnosis not present

## 2018-07-10 DIAGNOSIS — E559 Vitamin D deficiency, unspecified: Secondary | ICD-10-CM | POA: Diagnosis not present

## 2018-07-10 MED FILL — VIT D2 1.25 MG (50,000 UNIT: 1.25 MG | 28 days supply | Qty: 4 | Fill #0

## 2018-07-21 DIAGNOSIS — R112 Nausea with vomiting, unspecified: Secondary | ICD-10-CM | POA: Diagnosis not present

## 2018-07-21 DIAGNOSIS — Z9889 Other specified postprocedural states: Secondary | ICD-10-CM | POA: Diagnosis not present

## 2018-09-08 IMAGING — CT CT HEAD W/O CM
3 series · 15 of 47 positions shown, 18 images · non-contrast
Comparison: None.

CLINICAL DATA: Acute onset of headache and fever. Initial
encounter.

EXAM:
CT HEAD WITHOUT CONTRAST
TECHNIQUE: Contiguous axial images were obtained from the base of the skull
through the vertex without intravenous contrast.

[Series 3: head 5.0 h30s · axial · 0.45mm/px · z∈[-130,+5]mm · 9 of 33 slices shown, 12 images]
[im 3/33  brain]
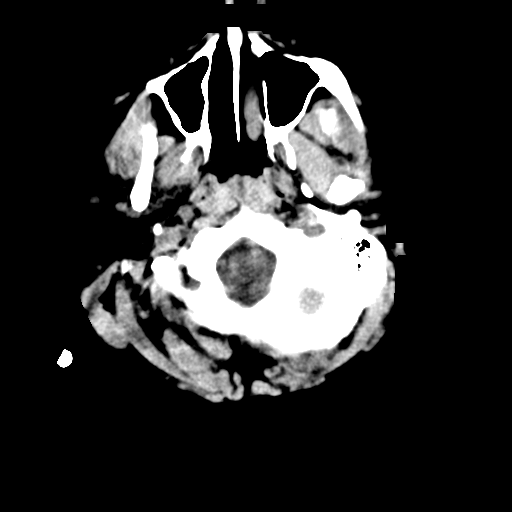
[im 3/33  bone]
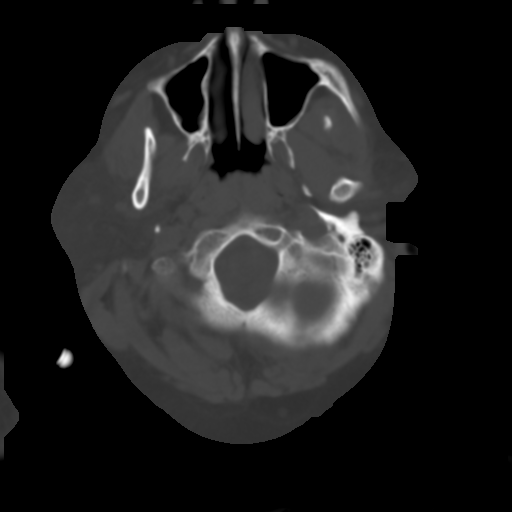
[im 6/33  brain]
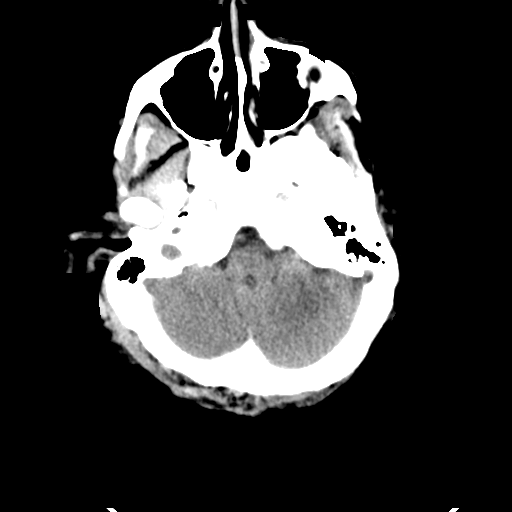
[im 9/33  brain]
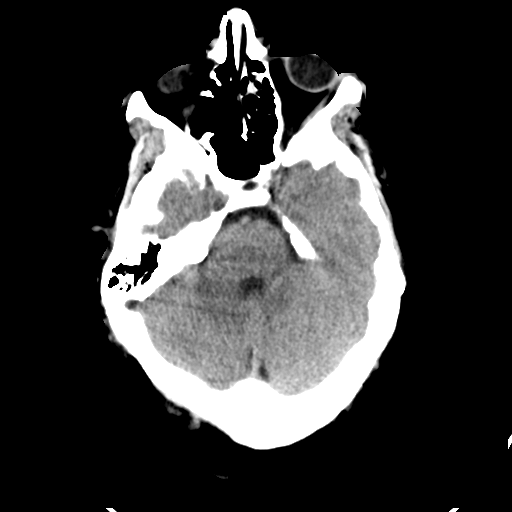
[im 13/33  brain]
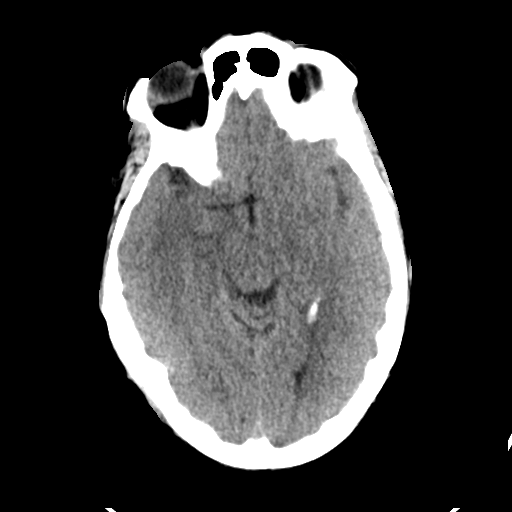
[im 17/33  brain]
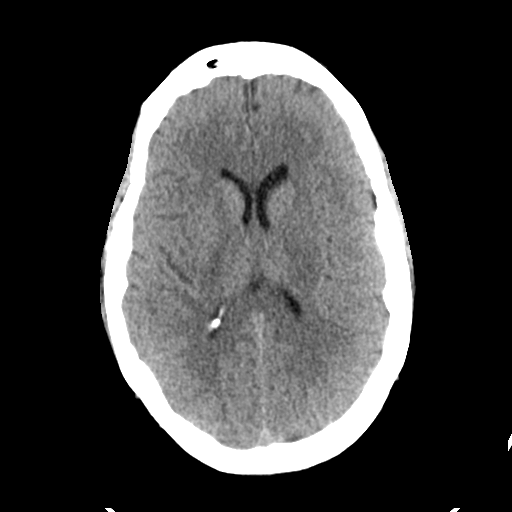
[im 17/33  bone]
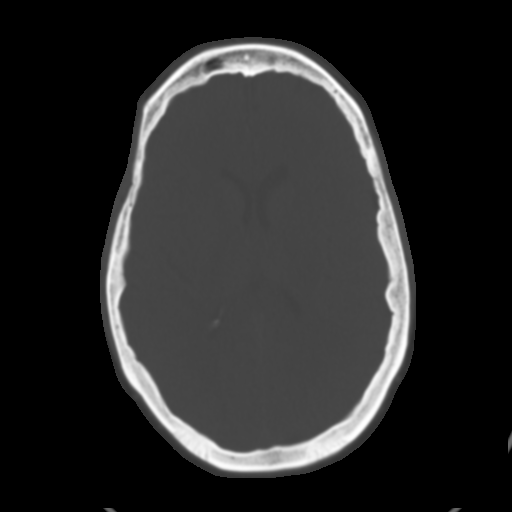
[im 20/33  brain]
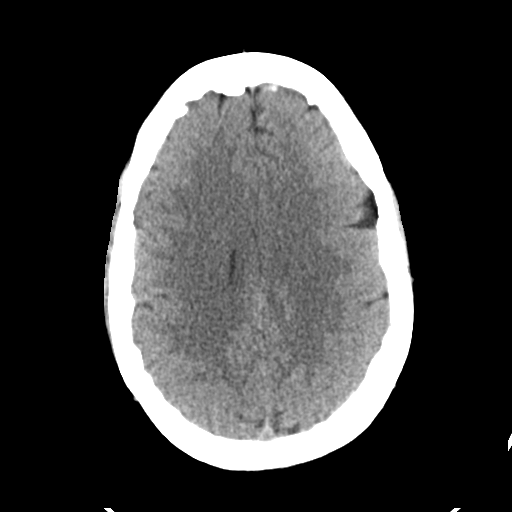
[im 24/33  brain]
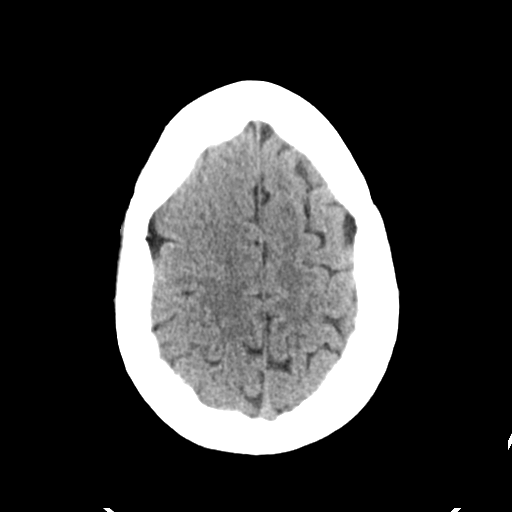
[im 27/33  brain]
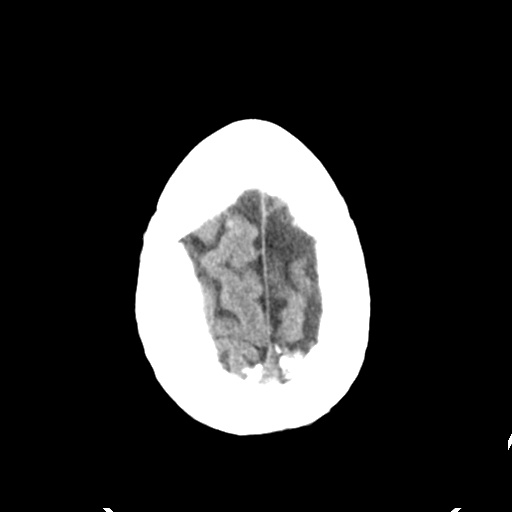
[im 30/33  brain]
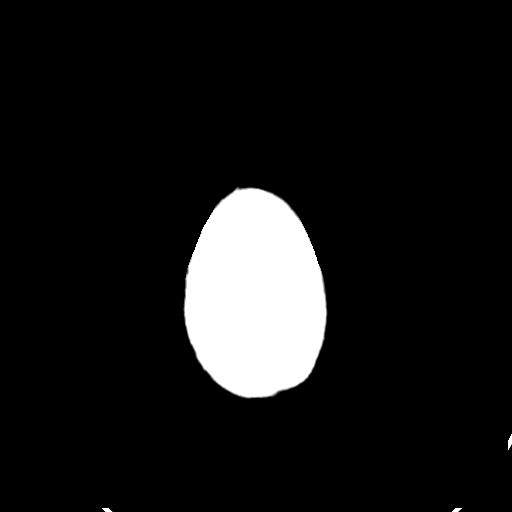
[im 30/33  bone]
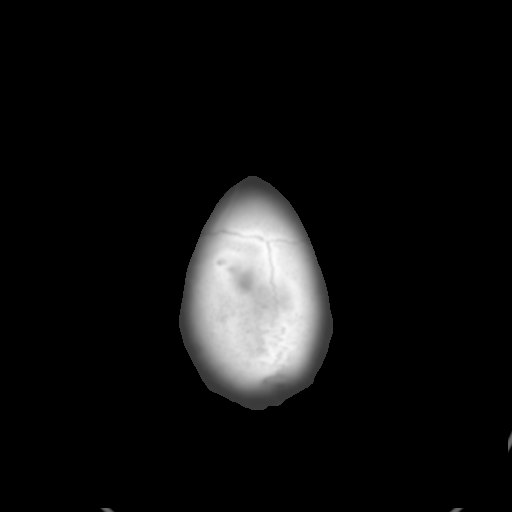

[Series 5: head 3.0 mpr cor · coronal · 0.31mm/px · 3 of 75 slices shown]
[im 25/75  brain]
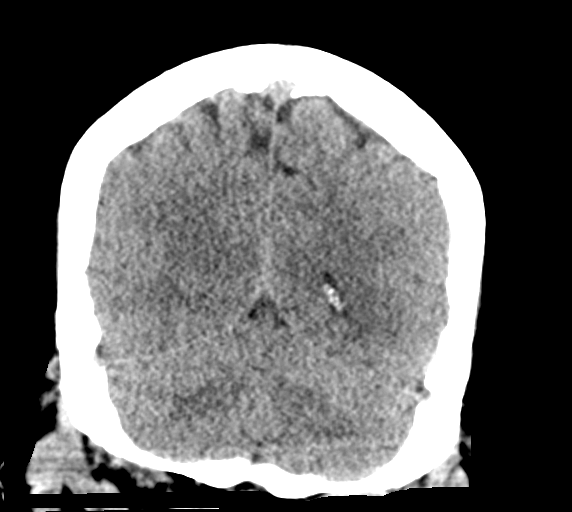
[im 33/75  brain]
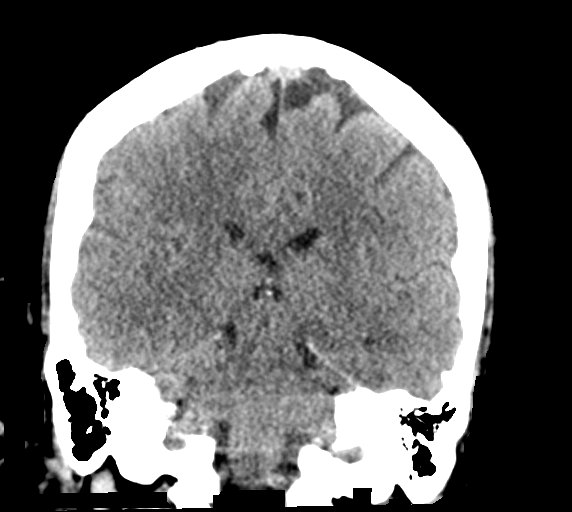
[im 42/75  brain]
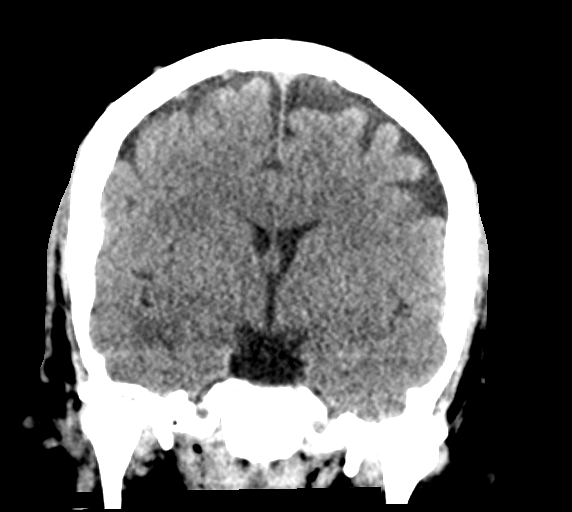

[Series 6: head 3.0 mpr sag · sagittal · 0.31mm/px · 3 of 60 slices shown]
[im 20/60  brain]
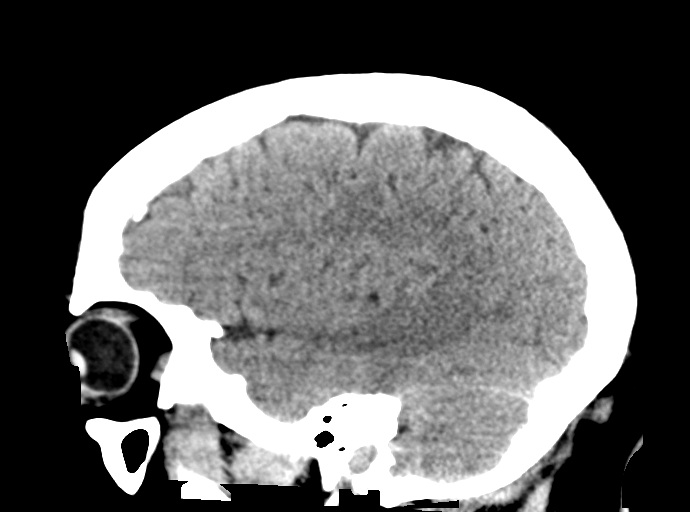
[im 30/60  brain]
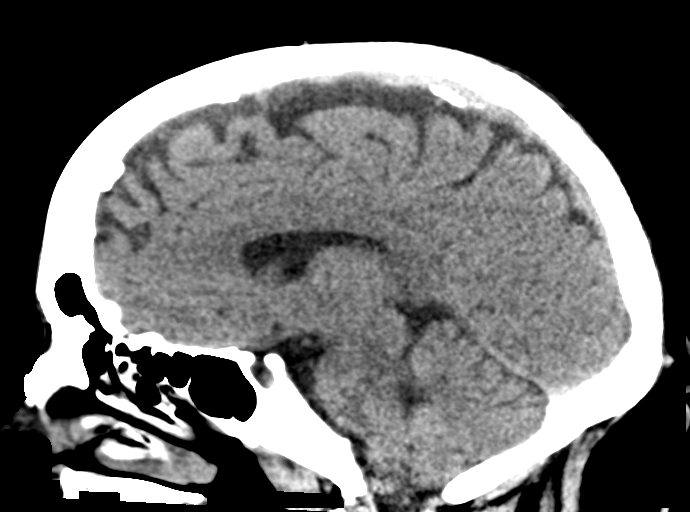
[im 40/60  brain]
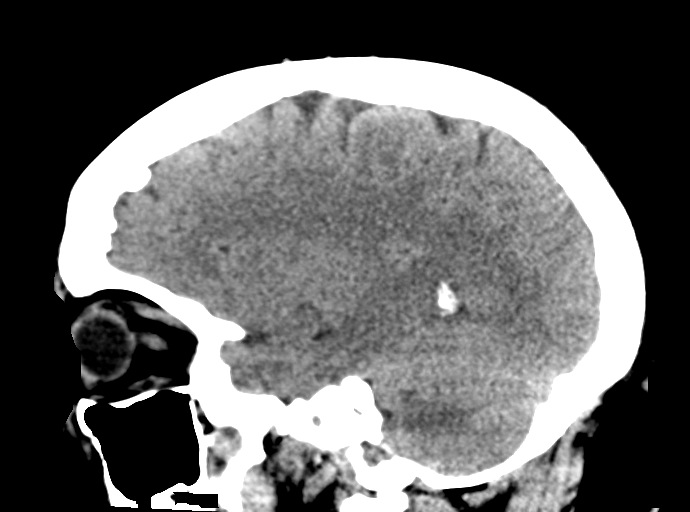

[15 of 47 positions shown; findings below may reference images not displayed]

FINDINGS: Brain: No evidence of acute infarction, hemorrhage, hydrocephalus,
extra-axial collection or mass lesion/mass effect.

The posterior fossa, including the cerebellum, brainstem and fourth
ventricle, is within normal limits. The third and lateral
ventricles, and basal ganglia are unremarkable in appearance. The
cerebral hemispheres are symmetric in appearance, with normal
gray-white differentiation. No mass effect or midline shift is seen.

Vascular: No hyperdense vessel or unexpected calcification.

Skull: There is no evidence of fracture; visualized osseous
structures are unremarkable in appearance.

Sinuses/Orbits: The orbits are within normal limits. The paranasal
sinuses and mastoid air cells are well-aerated.

Other: No significant soft tissue abnormalities are seen.
IMPRESSION: Unremarkable noncontrast CT of the head.

## 2018-09-08 IMAGING — CR DG CHEST 2V
2 series · 2 of 2 positions shown · non-contrast
Comparison: Chest radiograph 03/19/2009

CLINICAL DATA: Fever and headache

EXAM:
CHEST  2 VIEW

[chest lat]
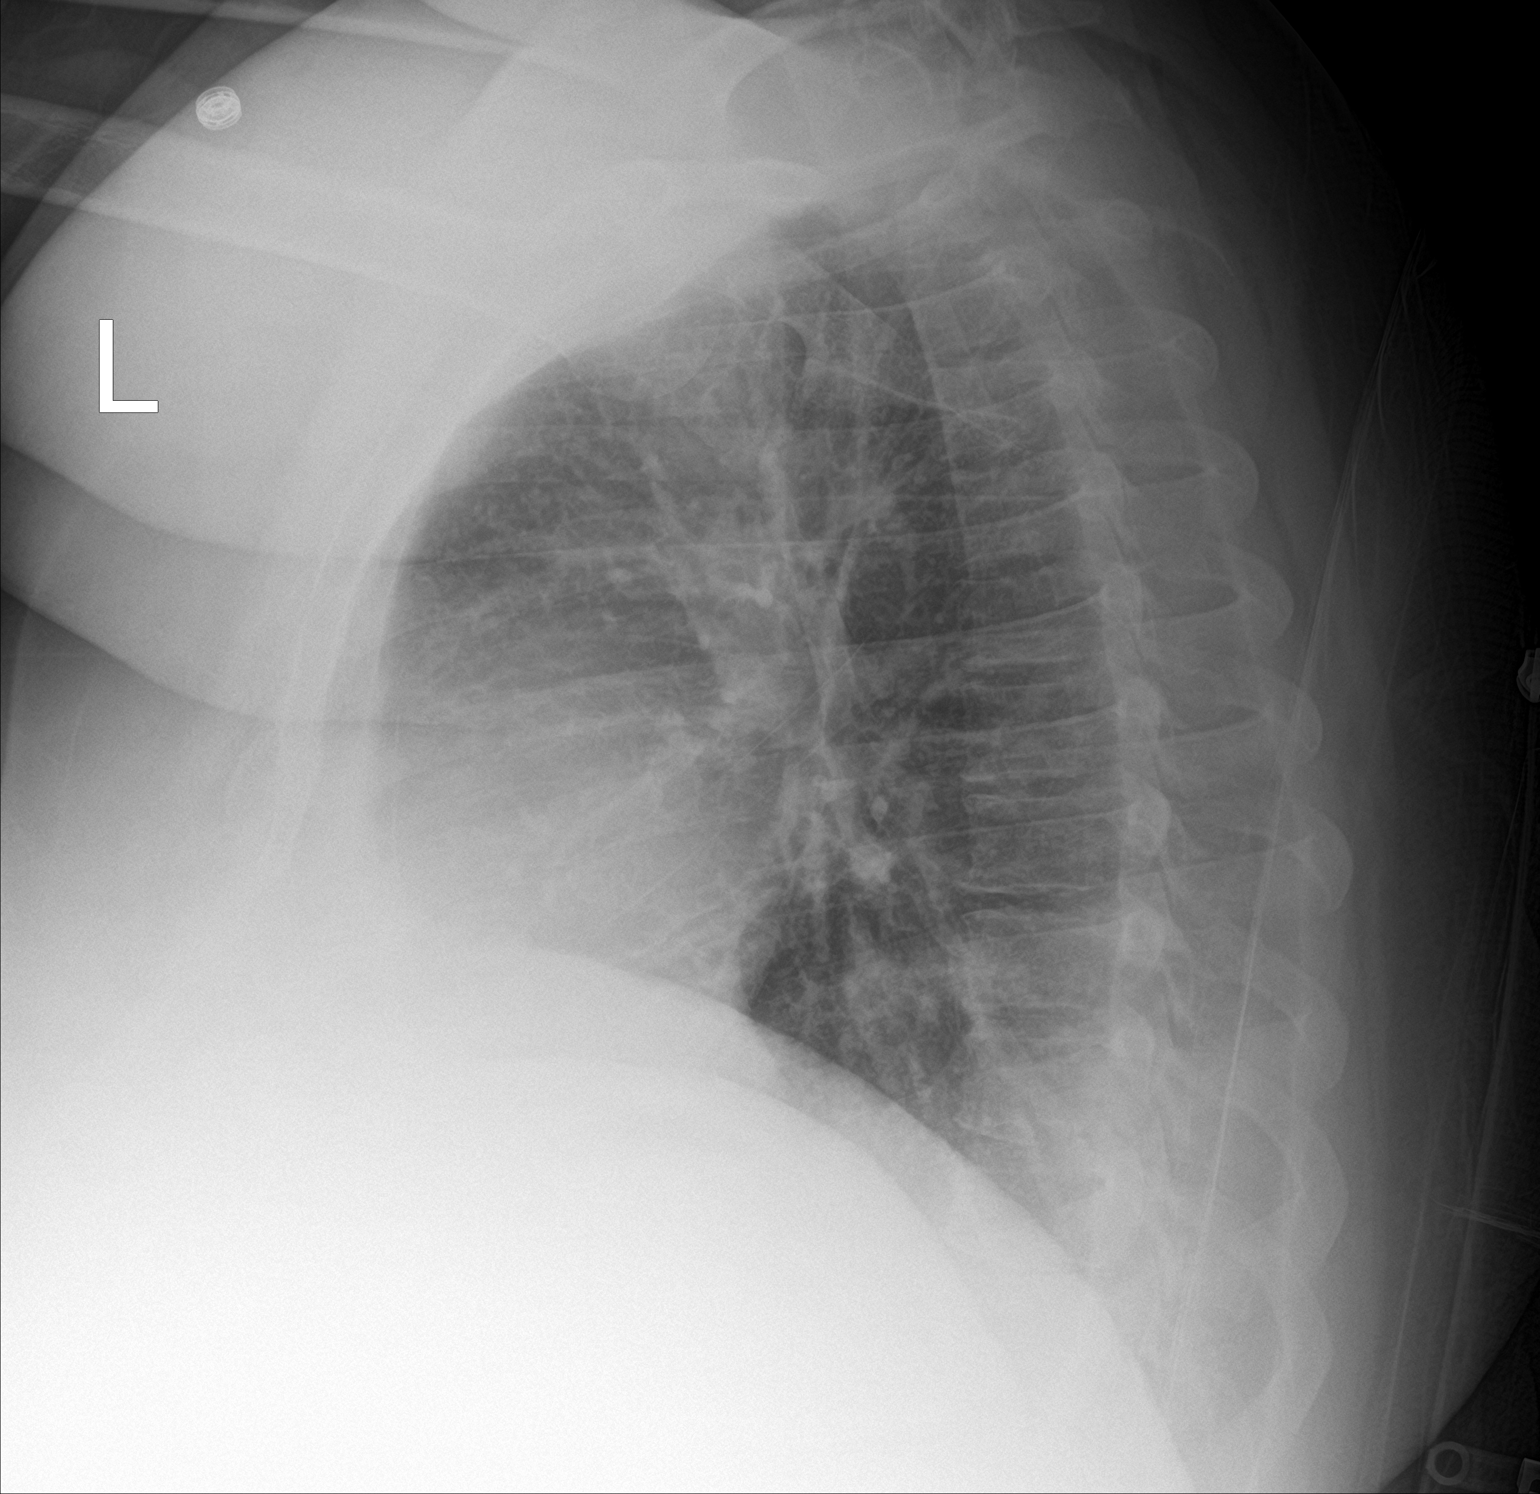

[chest ap]
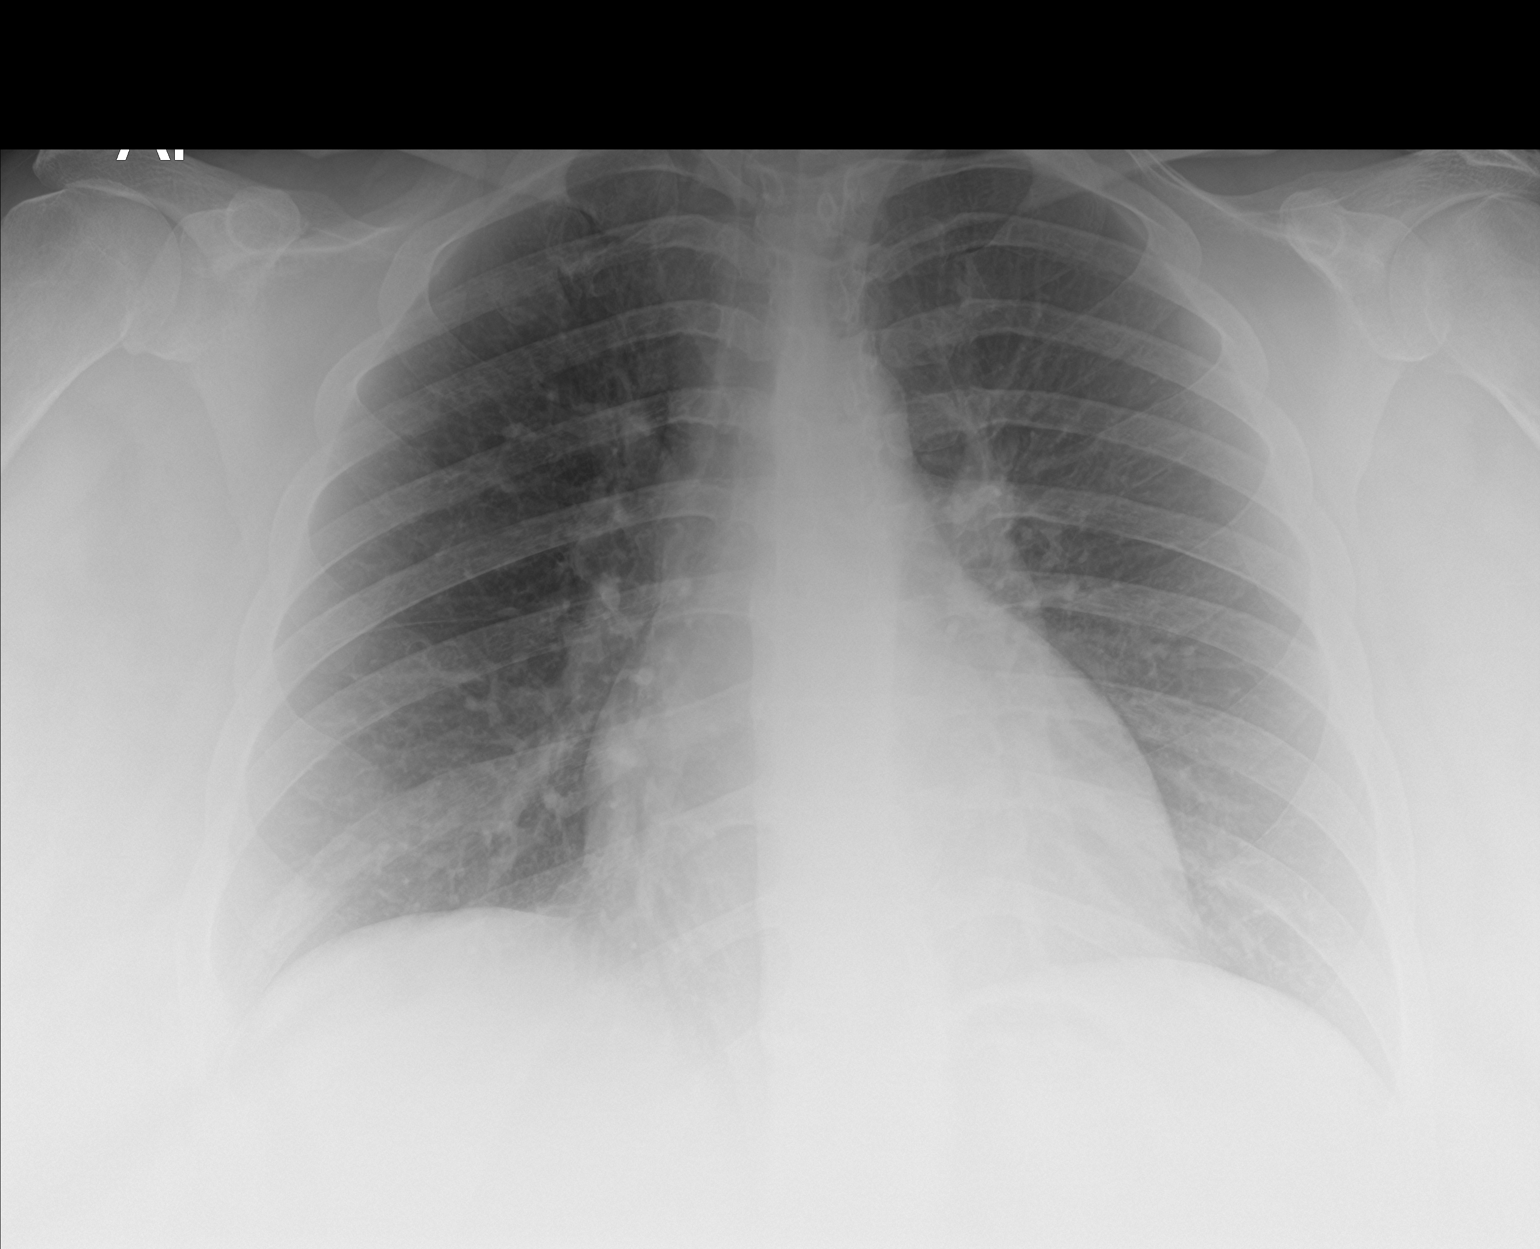

[2 of 2 positions shown; findings below may reference images not displayed]

FINDINGS: Cardiomegaly is unchanged. No pleural effusion or pneumothorax. No
focal airspace consolidation or pulmonary edema.
IMPRESSION: Unchanged cardiomegaly without acute cardiopulmonary process.

## 2018-11-30 ENCOUNTER — Encounter: Payer: Self-pay | Admitting: Family Medicine

## 2018-12-29 ENCOUNTER — Ambulatory Visit (INDEPENDENT_AMBULATORY_CARE_PROVIDER_SITE_OTHER): Payer: No Typology Code available for payment source | Admitting: Family Medicine

## 2018-12-29 ENCOUNTER — Other Ambulatory Visit: Payer: Self-pay

## 2018-12-29 ENCOUNTER — Encounter: Payer: Self-pay | Admitting: Family Medicine

## 2018-12-29 VITALS — BP 121/81 | HR 76 | Temp 98.0°F | Resp 16 | Ht 68.0 in | Wt 218.4 lb

## 2018-12-29 DIAGNOSIS — E6609 Other obesity due to excess calories: Secondary | ICD-10-CM

## 2018-12-29 DIAGNOSIS — Z6833 Body mass index (BMI) 33.0-33.9, adult: Secondary | ICD-10-CM

## 2018-12-29 DIAGNOSIS — Z0000 Encounter for general adult medical examination without abnormal findings: Secondary | ICD-10-CM

## 2018-12-29 DIAGNOSIS — F4310 Post-traumatic stress disorder, unspecified: Secondary | ICD-10-CM | POA: Diagnosis not present

## 2018-12-29 LAB — LIPID PANEL
Cholesterol: 135 mg/dL (ref 0–200)
HDL: 42.2 mg/dL (ref >39.00)
LDL Cholesterol: 56 mg/dL (ref 0–99)
NonHDL: 92.92
Total CHOL/HDL Ratio: 3
Triglycerides: 183 mg/dL — ABNORMAL HIGH (ref 0.0–149.0)
VLDL: 36.6 mg/dL (ref 0.0–40.0)

## 2018-12-29 LAB — CBC WITH DIFFERENTIAL/PLATELET
Basophils Absolute: 0.1 10*3/uL (ref 0.0–0.1)
Basophils Relative: 0.6 % (ref 0.0–3.0)
Eosinophils Absolute: 0.3 10*3/uL (ref 0.0–0.7)
Eosinophils Relative: 2.8 % (ref 0.0–5.0)
HCT: 42.6 % (ref 36.0–46.0)
Hemoglobin: 13.8 g/dL (ref 12.0–15.0)
Lymphocytes Relative: 41 % (ref 12.0–46.0)
Lymphs Abs: 4.5 10*3/uL — ABNORMAL HIGH (ref 0.7–4.0)
MCHC: 32.5 g/dL (ref 30.0–36.0)
MCV: 82.4 fl (ref 78.0–100.0)
Monocytes Absolute: 0.8 10*3/uL (ref 0.1–1.0)
Monocytes Relative: 7.3 % (ref 3.0–12.0)
Neutro Abs: 5.3 10*3/uL (ref 1.4–7.7)
Neutrophils Relative %: 48.3 % (ref 43.0–77.0)
Platelets: 360 10*3/uL (ref 150.0–400.0)
RBC: 5.16 Mil/uL — ABNORMAL HIGH (ref 3.87–5.11)
RDW: 14.9 % (ref 11.5–15.5)
WBC: 11 10*3/uL — ABNORMAL HIGH (ref 4.0–10.5)

## 2018-12-29 LAB — BASIC METABOLIC PANEL
BUN: 21 mg/dL (ref 6–23)
CO2: 28 mEq/L (ref 19–32)
Calcium: 9.6 mg/dL (ref 8.4–10.5)
Chloride: 105 mEq/L (ref 96–112)
Creatinine, Ser: 0.58 mg/dL (ref 0.40–1.20)
GFR: 113.92 mL/min (ref >60.00)
Glucose, Bld: 69 mg/dL — ABNORMAL LOW (ref 70–99)
Potassium: 4.1 mEq/L (ref 3.5–5.1)
Sodium: 140 mEq/L (ref 135–145)

## 2018-12-29 LAB — HEPATIC FUNCTION PANEL
ALT: 13 U/L (ref 0–35)
AST: 11 U/L (ref 0–37)
Albumin: 4.4 g/dL (ref 3.5–5.2)
Alkaline Phosphatase: 74 U/L (ref 39–117)
Bilirubin, Direct: 0.2 mg/dL (ref 0.0–0.3)
Total Bilirubin: 1.5 mg/dL — ABNORMAL HIGH (ref 0.2–1.2)
Total Protein: 7.1 g/dL (ref 6.0–8.3)

## 2018-12-29 LAB — TSH: TSH: 1.06 u[IU]/mL (ref 0.35–4.50)

## 2018-12-29 NOTE — Assessment & Plan Note (Signed)
Pt is down another 16 lbs.  Encouraged her to continue working on healthy diet and regular exercise.  Check labs to risk stratify.  Will follow.

## 2018-12-29 NOTE — Assessment & Plan Note (Signed)
Pt is aware that she needs to resume counseling for her own wellbeing.  Referral placed.

## 2018-12-29 NOTE — Assessment & Plan Note (Signed)
Pt's PE WNL w/ exception of obesity.  She continues to lose weight and applauded her efforts.  UTD on GYN, immunizations.  Check labs.  Anticipatory guidance provided.

## 2018-12-29 NOTE — Progress Notes (Signed)
   Subjective:    Patient ID: Michaela Norris, female    DOB: 09-Mar-1976, 43 y.o.   MRN: [id number]  HPI CPE- UTD on immunizations, pap smear.  Due for mammo- has appt scheduled.  Pt is down another 16 lbs.   Review of Systems Patient reports no vision/ hearing changes, adenopathy,fever, weight change,  persistant/recurrent hoarseness , swallowing issues, chest pain, palpitations, edema, persistant/recurrent cough, hemoptysis, dyspnea (rest/exertional/paroxysmal nocturnal), gastrointestinal bleeding (melena, rectal bleeding), abdominal pain, significant heartburn, bowel changes, GU symptoms (dysuria, hematuria, incontinence), Gyn symptoms (abnormal  bleeding, pain),  syncope, focal weakness, memory loss, numbness & tingling, skin/hair/nail changes, abnormal bruising or bleeding.   + anxiety- not currently in therapy, knows she needs to restart.    Objective:   Physical Exam General Appearance:    Alert, cooperative, no distress, appears stated age, obese  Head:    Normocephalic, without obvious abnormality, atraumatic  Eyes:    PERRL, conjunctiva/corneas clear, EOM's intact, fundi    benign, both eyes  Ears:    Normal TM's and external ear canals, both ears  Nose:   Nares normal, septum midline, mucosa normal, no drainage    or sinus tenderness  Throat:   Lips, mucosa, and tongue normal; teeth and gums normal  Neck:   Supple, symmetrical, trachea midline, no adenopathy;    Thyroid: no enlargement/tenderness/nodules  Back:     Symmetric, no curvature, ROM normal, no CVA tenderness  Lungs:     Clear to auscultation bilaterally, respirations unlabored  Chest Wall:    No tenderness or deformity   Heart:    Regular rate and rhythm, S1 and S2 normal, no murmur, rub   or gallop  Breast Exam:    Deferred to GYN  Abdomen:     Soft, non-tender, bowel sounds active all four quadrants,    no masses, no organomegaly  Genitalia:    Deferred to GYN  Rectal:    Extremities:   Extremities  normal, atraumatic, no cyanosis or edema  Pulses:   2+ and symmetric all extremities  Skin:   Skin color, texture, turgor normal, no rashes or lesions  Lymph nodes:   Cervical, supraclavicular, and axillary nodes normal  Neurologic:   CNII-XII intact, normal strength, sensation and reflexes    throughout          Assessment & Plan:

## 2018-12-29 NOTE — Patient Instructions (Signed)
Follow up in 1 year or as needed We'll notify you of your lab results and make any changes if needed Keep up the good work on healthy diet and regular exercise- you're doing great! Have Dr Elaina Pattee office send me a copy of your mammogram We'll call you to set up your counseling appt Call with any questions or concerns Stay Safe!!!

## 2018-12-30 ENCOUNTER — Other Ambulatory Visit: Payer: Self-pay | Admitting: *Deleted

## 2018-12-30 DIAGNOSIS — R17 Unspecified jaundice: Secondary | ICD-10-CM

## 2019-01-06 ENCOUNTER — Ambulatory Visit: Payer: BC Managed Care – PPO

## 2019-01-11 ENCOUNTER — Ambulatory Visit: Payer: Medicare Other

## 2019-01-11 ENCOUNTER — Other Ambulatory Visit: Payer: Self-pay

## 2019-01-11 DIAGNOSIS — R17 Unspecified jaundice: Secondary | ICD-10-CM

## 2019-01-12 LAB — BILIRUBIN, FRACTIONATED(TOT/DIR/INDIR)
Bilirubin, Direct: 0.3 mg/dL — ABNORMAL HIGH (ref 0.0–0.2)
Indirect Bilirubin: 0.9 mg/dL (calc) (ref 0.2–1.2)
Total Bilirubin: 1.2 mg/dL (ref 0.2–1.2)

## 2019-01-12 LAB — EXTRA LAV TOP TUBE

## 2019-02-05 ENCOUNTER — Ambulatory Visit (INDEPENDENT_AMBULATORY_CARE_PROVIDER_SITE_OTHER): Payer: BC Managed Care – PPO | Admitting: Clinical

## 2019-02-05 DIAGNOSIS — F419 Anxiety disorder, unspecified: Secondary | ICD-10-CM

## 2019-03-01 ENCOUNTER — Ambulatory Visit (INDEPENDENT_AMBULATORY_CARE_PROVIDER_SITE_OTHER): Payer: BC Managed Care – PPO | Admitting: Clinical

## 2019-03-01 DIAGNOSIS — F419 Anxiety disorder, unspecified: Secondary | ICD-10-CM | POA: Diagnosis not present

## 2019-03-31 ENCOUNTER — Ambulatory Visit (INDEPENDENT_AMBULATORY_CARE_PROVIDER_SITE_OTHER): Payer: BC Managed Care – PPO | Admitting: Clinical

## 2019-03-31 DIAGNOSIS — F419 Anxiety disorder, unspecified: Secondary | ICD-10-CM

## 2019-04-14 ENCOUNTER — Ambulatory Visit (INDEPENDENT_AMBULATORY_CARE_PROVIDER_SITE_OTHER): Payer: BC Managed Care – PPO | Admitting: Clinical

## 2019-04-14 ENCOUNTER — Ambulatory Visit: Payer: Medicare Other | Admitting: Clinical

## 2019-04-14 DIAGNOSIS — F419 Anxiety disorder, unspecified: Secondary | ICD-10-CM | POA: Diagnosis not present

## 2019-05-31 ENCOUNTER — Ambulatory Visit (INDEPENDENT_AMBULATORY_CARE_PROVIDER_SITE_OTHER): Payer: BC Managed Care – PPO | Admitting: Clinical

## 2019-05-31 DIAGNOSIS — F419 Anxiety disorder, unspecified: Secondary | ICD-10-CM | POA: Diagnosis not present

## 2019-06-09 ENCOUNTER — Ambulatory Visit (INDEPENDENT_AMBULATORY_CARE_PROVIDER_SITE_OTHER): Payer: BC Managed Care – PPO | Admitting: Clinical

## 2019-06-09 DIAGNOSIS — F419 Anxiety disorder, unspecified: Secondary | ICD-10-CM | POA: Diagnosis not present

## 2019-06-23 ENCOUNTER — Ambulatory Visit (INDEPENDENT_AMBULATORY_CARE_PROVIDER_SITE_OTHER): Payer: BC Managed Care – PPO | Admitting: Clinical

## 2019-06-23 DIAGNOSIS — F419 Anxiety disorder, unspecified: Secondary | ICD-10-CM

## 2019-07-07 ENCOUNTER — Ambulatory Visit (INDEPENDENT_AMBULATORY_CARE_PROVIDER_SITE_OTHER): Payer: BC Managed Care – PPO | Admitting: Clinical

## 2019-07-07 DIAGNOSIS — F419 Anxiety disorder, unspecified: Secondary | ICD-10-CM | POA: Diagnosis not present

## 2019-07-21 ENCOUNTER — Ambulatory Visit (INDEPENDENT_AMBULATORY_CARE_PROVIDER_SITE_OTHER): Payer: BC Managed Care – PPO | Admitting: Clinical

## 2019-07-21 DIAGNOSIS — F419 Anxiety disorder, unspecified: Secondary | ICD-10-CM

## 2019-08-04 ENCOUNTER — Ambulatory Visit (INDEPENDENT_AMBULATORY_CARE_PROVIDER_SITE_OTHER): Payer: BC Managed Care – PPO | Admitting: Clinical

## 2019-08-04 DIAGNOSIS — F419 Anxiety disorder, unspecified: Secondary | ICD-10-CM | POA: Diagnosis not present

## 2019-08-18 ENCOUNTER — Ambulatory Visit (INDEPENDENT_AMBULATORY_CARE_PROVIDER_SITE_OTHER): Payer: BC Managed Care – PPO | Admitting: Clinical

## 2019-08-18 DIAGNOSIS — F419 Anxiety disorder, unspecified: Secondary | ICD-10-CM | POA: Diagnosis not present

## 2019-09-01 ENCOUNTER — Ambulatory Visit (INDEPENDENT_AMBULATORY_CARE_PROVIDER_SITE_OTHER): Payer: BC Managed Care – PPO | Admitting: Clinical

## 2019-09-01 DIAGNOSIS — F419 Anxiety disorder, unspecified: Secondary | ICD-10-CM | POA: Diagnosis not present

## 2019-09-15 ENCOUNTER — Ambulatory Visit: Payer: Medicare Other | Admitting: Clinical

## 2019-09-28 ENCOUNTER — Ambulatory Visit (INDEPENDENT_AMBULATORY_CARE_PROVIDER_SITE_OTHER): Payer: BC Managed Care – PPO | Admitting: Clinical

## 2019-09-28 DIAGNOSIS — F419 Anxiety disorder, unspecified: Secondary | ICD-10-CM | POA: Diagnosis not present

## 2019-09-29 ENCOUNTER — Ambulatory Visit: Payer: Medicare Other | Admitting: Clinical

## 2019-10-06 ENCOUNTER — Ambulatory Visit: Payer: BC Managed Care – PPO

## 2019-10-12 ENCOUNTER — Ambulatory Visit (INDEPENDENT_AMBULATORY_CARE_PROVIDER_SITE_OTHER): Payer: BC Managed Care – PPO | Admitting: Clinical

## 2019-10-12 DIAGNOSIS — F419 Anxiety disorder, unspecified: Secondary | ICD-10-CM | POA: Diagnosis not present

## 2019-10-13 ENCOUNTER — Ambulatory Visit: Payer: Medicare Other | Admitting: Clinical

## 2019-10-21 ENCOUNTER — Encounter: Payer: Self-pay | Admitting: Family Medicine

## 2019-10-21 ENCOUNTER — Telehealth (INDEPENDENT_AMBULATORY_CARE_PROVIDER_SITE_OTHER): Payer: BC Managed Care – PPO | Admitting: Family Medicine

## 2019-10-21 ENCOUNTER — Other Ambulatory Visit: Payer: Self-pay

## 2019-10-21 DIAGNOSIS — B369 Superficial mycosis, unspecified: Secondary | ICD-10-CM

## 2019-10-21 MED ORDER — FLUCONAZOLE 200 MG PO TABS: 200.0000 mg | ORAL_TABLET | Freq: Every day | ORAL | 0 refills | Status: DC

## 2019-10-21 MED ORDER — NYSTATIN 100000 UNIT/GM EX POWD: Freq: Three times a day (TID) | CUTANEOUS | 6 refills | Status: DC

## 2019-10-21 MED FILL — NYSTATIN 100000 UNIT/GM POW: 100000 | 15 days supply | Qty: 60 | Fill #0

## 2019-10-21 MED FILL — FLUCONAZOLE 200 MG TABLET: 200 | 7 days supply | Qty: 7 | Fill #0

## 2019-10-21 NOTE — Progress Notes (Signed)
   Virtual Visit via Video   I connected with patient on 10/21/19 at  3:30 PM EDT by a video enabled telemedicine application and verified that I am speaking with the correct person using two identifiers.  Location patient: Home Location provider: Acupuncturist, Office Persons participating in the virtual visit: Patient, Provider, [place] (Jess B)  I discussed the limitations of evaluation and management by telemedicine and the availability of in person appointments. The patient expressed understanding and agreed to proceed.  Subjective:   HPI:   Rash- 'it came out of nowhere and now it's spreading'.  1st noticed 3 weeks ago but at that time was localized, 3 days ago began spreading.  'very itchy'.  No known contact w/ irritants.  Pt has a lot of loose skin from her weight loss and the rash is 'all over' the loose skin, elbows, and behind knees.  'it's particular bad in the fold'.  + satellite lesions.  ROS:   See pertinent positives and negatives per HPI.  Patient Active Problem List   Diagnosis Date Noted  . PTSD (post-traumatic stress disorder) 12/29/2018  . PCOS (polycystic ovarian syndrome) 06/19/2016  . Obese 06/19/2016  . OSA on CPAP 03/18/2013  . BPPV (benign paroxysmal positional vertigo) 03/08/2013  . Asperger's disorder 10/07/2012  . Physical exam 02/26/2012  . Anemia 12/25/2011  . Pseudotumor cerebri 12/25/2011    Social History   Tobacco Use  . Smoking status: Never Smoker  . Smokeless tobacco: Never Used  Substance Use Topics  . Alcohol use: No    Current Outpatient Medications:  .  CALCIUM CITRATE PO, Take by mouth., Disp: , Rfl:  .  Cholecalciferol (VITAMIN D PO), Take by mouth., Disp: , Rfl:  .  ferrous sulfate 325 (65 FE) MG tablet, Take 1 tablet (325 mg total) by mouth daily with breakfast., Disp: 30 tablet, Rfl: 6 .  methocarbamol (ROBAXIN) 500 MG tablet, Take 1 tablet (500 mg total) by mouth every 8 (eight) hours as needed for muscle spasms.,  Disp: 90 tablet, Rfl: 1 .  Multiple Vitamins-Minerals (MULTIVITAMIN ADULTS PO), Take by mouth., Disp: , Rfl:  .  nystatin (MYCOSTATIN/NYSTOP) powder, Apply topically 3 (three) times daily., Disp: 60 g, Rfl: 6 .  traMADol (ULTRAM) 50 MG tablet, Take 1 tablet (50 mg total) by mouth every 8 (eight) hours as needed., Disp: 15 tablet, Rfl: 0  Allergies  Allergen Reactions  . Latex Itching and Rash  . Penicillins Swelling    Swelling in hands; states she still takes it as needed  . Tegretol [Carbamazepine] Rash  . Vicodin [Hydrocodone-Acetaminophen] Rash    Objective:   There were no vitals taken for this visit. AAOx3, NAD NCAT, EOMI No obvious CN deficits Coloring WNL Pt is able to speak clearly, coherently without shortness of breath or increased work of breathing.  Erythematous rash in pannus folds Thought process is linear.  Mood is appropriate.   Assessment and Plan:   Fungal dermatitis/intertrigo- deteriorated.  pt has hx of similar but this appears to be more widespread based on pt's description.  Will restart topical Nystatin powder but will add oral diflucan x7 days.  If no improvement will need OV or derm referral.  Pt expressed understanding and is in agreement w/ plan.   Annye Asa, MD 10/21/2019

## 2019-10-21 NOTE — Progress Notes (Signed)
I have discussed the procedure for the virtual visit with the patient who has given consent to proceed with assessment and treatment.   Pt unable to obtain vitals.   Geroge Gilliam L Eldonna Neuenfeldt, CMA     

## 2019-10-26 ENCOUNTER — Ambulatory Visit (INDEPENDENT_AMBULATORY_CARE_PROVIDER_SITE_OTHER): Payer: BC Managed Care – PPO | Admitting: Clinical

## 2019-10-26 DIAGNOSIS — F419 Anxiety disorder, unspecified: Secondary | ICD-10-CM

## 2019-10-27 ENCOUNTER — Ambulatory Visit: Payer: No Typology Code available for payment source | Admitting: Clinical

## 2019-11-09 ENCOUNTER — Ambulatory Visit (INDEPENDENT_AMBULATORY_CARE_PROVIDER_SITE_OTHER): Payer: BC Managed Care – PPO | Admitting: Clinical

## 2019-11-09 DIAGNOSIS — F419 Anxiety disorder, unspecified: Secondary | ICD-10-CM

## 2019-11-10 ENCOUNTER — Ambulatory Visit: Payer: Medicare Other | Admitting: Clinical

## 2019-11-23 ENCOUNTER — Ambulatory Visit (INDEPENDENT_AMBULATORY_CARE_PROVIDER_SITE_OTHER): Payer: BC Managed Care – PPO | Admitting: Clinical

## 2019-11-23 DIAGNOSIS — F419 Anxiety disorder, unspecified: Secondary | ICD-10-CM

## 2019-11-24 ENCOUNTER — Ambulatory Visit: Payer: Medicare Other | Admitting: Clinical

## 2019-11-25 ENCOUNTER — Telehealth: Payer: Self-pay | Admitting: Family Medicine

## 2019-11-25 NOTE — Telephone Encounter (Signed)
FYi

## 2019-11-25 NOTE — Telephone Encounter (Signed)
Called patient and they will pick up on Friday 

## 2019-11-25 NOTE — Telephone Encounter (Signed)
Pt dropped off a Medical Evaluation form, I have placed it in the bin up front with a charge sheet.  

## 2019-11-25 NOTE — Telephone Encounter (Signed)
Form completed and placed in basket  

## 2019-11-25 NOTE — Telephone Encounter (Signed)
Will review and give papers to PCP for completion.

## 2019-12-07 ENCOUNTER — Ambulatory Visit (INDEPENDENT_AMBULATORY_CARE_PROVIDER_SITE_OTHER): Payer: BC Managed Care – PPO | Admitting: Clinical

## 2019-12-07 DIAGNOSIS — F419 Anxiety disorder, unspecified: Secondary | ICD-10-CM

## 2019-12-08 ENCOUNTER — Ambulatory Visit: Payer: Medicare Other | Admitting: Clinical

## 2019-12-13 ENCOUNTER — Ambulatory Visit (INDEPENDENT_AMBULATORY_CARE_PROVIDER_SITE_OTHER): Payer: BC Managed Care – PPO | Admitting: Psychology

## 2019-12-13 DIAGNOSIS — F840 Autistic disorder: Secondary | ICD-10-CM | POA: Diagnosis not present

## 2019-12-13 DIAGNOSIS — F251 Schizoaffective disorder, depressive type: Secondary | ICD-10-CM

## 2019-12-22 ENCOUNTER — Ambulatory Visit: Payer: Medicare Other | Admitting: Clinical

## 2019-12-29 ENCOUNTER — Ambulatory Visit: Payer: BC Managed Care – PPO | Admitting: Psychology

## 2019-12-30 ENCOUNTER — Encounter: Payer: Medicare Other | Admitting: Family Medicine

## 2019-12-30 ENCOUNTER — Ambulatory Visit: Payer: BC Managed Care – PPO | Admitting: Psychology

## 2020-01-04 ENCOUNTER — Ambulatory Visit (INDEPENDENT_AMBULATORY_CARE_PROVIDER_SITE_OTHER): Payer: BC Managed Care – PPO | Admitting: Clinical

## 2020-01-04 DIAGNOSIS — F419 Anxiety disorder, unspecified: Secondary | ICD-10-CM | POA: Diagnosis not present

## 2020-01-05 ENCOUNTER — Ambulatory Visit: Payer: Medicare Other | Admitting: Clinical

## 2020-01-05 DIAGNOSIS — F900 Attention-deficit hyperactivity disorder, predominantly inattentive type: Secondary | ICD-10-CM | POA: Diagnosis not present

## 2020-01-05 DIAGNOSIS — F840 Autistic disorder: Secondary | ICD-10-CM | POA: Diagnosis not present

## 2020-01-05 DIAGNOSIS — F810 Specific reading disorder: Secondary | ICD-10-CM | POA: Diagnosis not present

## 2020-01-13 ENCOUNTER — Telehealth (INDEPENDENT_AMBULATORY_CARE_PROVIDER_SITE_OTHER): Payer: BC Managed Care – PPO | Admitting: Family Medicine

## 2020-01-13 ENCOUNTER — Other Ambulatory Visit: Payer: Self-pay

## 2020-01-13 ENCOUNTER — Encounter: Payer: Self-pay | Admitting: Family Medicine

## 2020-01-13 VITALS — Ht 68.0 in | Wt 218.0 lb

## 2020-01-13 DIAGNOSIS — S51859A Open bite of unspecified forearm, initial encounter: Secondary | ICD-10-CM | POA: Diagnosis not present

## 2020-01-13 DIAGNOSIS — W503XXA Accidental bite by another person, initial encounter: Secondary | ICD-10-CM | POA: Diagnosis not present

## 2020-01-13 MED ORDER — DOXYCYCLINE HYCLATE 100 MG PO TABS: 100.0000 mg | ORAL_TABLET | Freq: Two times a day (BID) | ORAL | 0 refills | Status: DC

## 2020-01-13 MED FILL — DOXYCYCLINE HYCLATE 100 MG: 100 | 10 days supply | Qty: 20 | Fill #0

## 2020-01-13 NOTE — Progress Notes (Signed)
° °  Virtual Visit via Video   I connected with patient on 01/13/20 at 11:30 AM EDT by a video enabled telemedicine application and verified that I am speaking with the correct person using two identifiers.  Location patient: Home Location provider: Acupuncturist, Office Persons participating in the virtual visit: Patient, Provider, [place] (JoEllen T)  I discussed the limitations of evaluation and management by telemedicine and the availability of in person appointments. The patient expressed understanding and agreed to proceed.  Subjective:   HPI:   Human bite- foster child bit her last night.  'it is right on my carpal tunnel and it is really affecting me using this hand today'.  Minimal open skin.  Area is 'a little red and swollen'.  UTD on Tdap.  Having numbness from 1st MCP joint back through forearm.  + weakness in L hand.  ROS:   See pertinent positives and negatives per HPI.  Patient Active Problem List   Diagnosis Date Noted   PTSD (post-traumatic stress disorder) 12/29/2018   PCOS (polycystic ovarian syndrome) 06/19/2016   Obese 06/19/2016   OSA on CPAP 03/18/2013   BPPV (benign paroxysmal positional vertigo) 03/08/2013   Asperger's disorder 10/07/2012   Physical exam 02/26/2012   Anemia 12/25/2011   Pseudotumor cerebri 12/25/2011    Social History   Tobacco Use   Smoking status: Never Smoker   Smokeless tobacco: Never Used  Substance Use Topics   Alcohol use: No    Current Outpatient Medications:    CALCIUM CITRATE PO, Take by mouth., Disp: , Rfl:    Cholecalciferol (VITAMIN D PO), Take by mouth., Disp: , Rfl:    ferrous sulfate 325 (65 FE) MG tablet, Take 1 tablet (325 mg total) by mouth daily with breakfast., Disp: 30 tablet, Rfl: 6   Multiple Vitamins-Minerals (MULTIVITAMIN ADULTS PO), Take by mouth., Disp: , Rfl:    nystatin (MYCOSTATIN/NYSTOP) powder, Apply topically 3 (three) times daily., Disp: 60 g, Rfl: 6  Allergies  Allergen  Reactions   Latex Itching and Rash   Penicillins Swelling    Swelling in hands; states she still takes it as needed   Tegretol [Carbamazepine] Rash   Vicodin [Hydrocodone-Acetaminophen] Rash    Objective:   Ht 5\' 8"  (1.727 m)    Wt 218 lb (98.9 kg)    BMI 33.15 kg/m  AAOx3, NAD NCAT, EOMI No obvious CN deficits Coloring WNL Pt is able to speak clearly, coherently without shortness of breath or increased work of breathing.  Bite marks/bruising over radial aspect of L forearm Thought process is linear.  Mood is appropriate.   Assessment and Plan:   Human bite forearm- new.  Discussed possibility of tenosynovitis/infxn and that would need urgent treatment/washout/debridement.  Pt is to monitor closely for worsening swelling, spreading redness.  Will start Doxy due to PCN allergy.  UTD on Tdap.  Reviewed supportive care and red flags that should prompt return.  Pt expressed understanding and is in agreement w/ plan.    Annye Asa, MD 01/13/2020

## 2020-01-14 ENCOUNTER — Telehealth: Payer: Self-pay | Admitting: Family Medicine

## 2020-01-14 ENCOUNTER — Other Ambulatory Visit: Payer: Self-pay

## 2020-01-14 MED ORDER — FLUCONAZOLE 150 MG PO TABS: 150.0000 mg | ORAL_TABLET | Freq: Once | ORAL | 0 refills | Status: AC

## 2020-01-14 MED FILL — FLUCONAZOLE 150 MG TABS: 150 | 1 days supply | Qty: 1 | Fill #0

## 2020-01-14 NOTE — Telephone Encounter (Signed)
Called patient let know we have sent in .

## 2020-01-14 NOTE — Telephone Encounter (Signed)
Ok to send in.  

## 2020-01-14 NOTE — Telephone Encounter (Signed)
Ok for Diflucan 150mg x1 dose 

## 2020-01-14 NOTE — Telephone Encounter (Signed)
Pt called in asking if Birdie Riddle would be willing to call in diflucan, she was put on an antibiotic yesterday.   Please advise she uses [facility] outpt pharmacy

## 2020-01-18 ENCOUNTER — Ambulatory Visit (INDEPENDENT_AMBULATORY_CARE_PROVIDER_SITE_OTHER): Payer: BC Managed Care – PPO | Admitting: Clinical

## 2020-01-18 DIAGNOSIS — F419 Anxiety disorder, unspecified: Secondary | ICD-10-CM | POA: Diagnosis not present

## 2020-02-01 ENCOUNTER — Ambulatory Visit (INDEPENDENT_AMBULATORY_CARE_PROVIDER_SITE_OTHER): Payer: BC Managed Care – PPO | Admitting: Clinical

## 2020-02-01 DIAGNOSIS — F419 Anxiety disorder, unspecified: Secondary | ICD-10-CM | POA: Diagnosis not present

## 2020-02-14 ENCOUNTER — Encounter: Payer: Medicare Other | Admitting: Family Medicine

## 2020-02-15 ENCOUNTER — Ambulatory Visit (INDEPENDENT_AMBULATORY_CARE_PROVIDER_SITE_OTHER): Payer: BC Managed Care – PPO | Admitting: Clinical

## 2020-02-15 DIAGNOSIS — F419 Anxiety disorder, unspecified: Secondary | ICD-10-CM

## 2020-02-16 ENCOUNTER — Encounter: Payer: Self-pay | Admitting: Family Medicine

## 2020-02-16 ENCOUNTER — Encounter: Payer: Medicare Other | Admitting: Family Medicine

## 2020-02-25 ENCOUNTER — Telehealth: Payer: Self-pay | Admitting: Family Medicine

## 2020-02-25 ENCOUNTER — Telehealth (INDEPENDENT_AMBULATORY_CARE_PROVIDER_SITE_OTHER): Payer: BC Managed Care – PPO | Admitting: Family Medicine

## 2020-02-25 ENCOUNTER — Encounter: Payer: Self-pay | Admitting: Family Medicine

## 2020-02-25 ENCOUNTER — Other Ambulatory Visit: Payer: Self-pay

## 2020-02-25 DIAGNOSIS — F900 Attention-deficit hyperactivity disorder, predominantly inattentive type: Secondary | ICD-10-CM | POA: Diagnosis not present

## 2020-02-25 MED ORDER — METHYLPHENIDATE HCL ER (OSM) 18 MG PO TBCR: 18.0000 mg | EXTENDED_RELEASE_TABLET | Freq: Every day | ORAL | 0 refills | Status: DC

## 2020-02-25 MED FILL — METHYLPHENIDATE HCL ER 18 M: 18 | 30 days supply | Qty: 30 | Fill #0

## 2020-02-25 NOTE — Progress Notes (Signed)
   Virtual Visit via Video   I connected with patient on 02/25/20 at 10:30 AM EDT by a video enabled telemedicine application and verified that I am speaking with the correct person using two identifiers.  Location patient: Home Location provider: Acupuncturist, Office Persons participating in the virtual visit: Patient, Provider, [place] (Jess B)  I discussed the limitations of evaluation and management by telemedicine and the availability of in person appointments. The patient expressed understanding and agreed to proceed.  Subjective:   HPI:   ADHD- pt was dx'd w/ ADHD by Dr Lurline Hare in August 2021.  She was doing really well going back to work when she was able to work from home.  She had less distractions and her sensory environment was something she could control- lights, temperature, etc.  Has struggled returning to work 5 days/week.  Is asking for accommodations to work from home 2 days/week to improve her emotional wellbeing.  Pt is interested in trying ADHD medication.  She has never been on stimulants previously.     ROS:   See pertinent positives and negatives per HPI.  Patient Active Problem List   Diagnosis Date Noted   PTSD (post-traumatic stress disorder) 12/29/2018   PCOS (polycystic ovarian syndrome) 06/19/2016   Obese 06/19/2016   OSA on CPAP 03/18/2013   BPPV (benign paroxysmal positional vertigo) 03/08/2013   Asperger's disorder 10/07/2012   Anemia 12/25/2011   Pseudotumor cerebri 12/25/2011    Social History   Tobacco Use   Smoking status: Never Smoker   Smokeless tobacco: Never Used  Substance Use Topics   Alcohol use: No    Current Outpatient Medications:    CALCIUM CITRATE PO, Take by mouth., Disp: , Rfl:    Cholecalciferol (VITAMIN D PO), Take by mouth., Disp: , Rfl:    ferrous sulfate 325 (65 FE) MG tablet, Take 1 tablet (325 mg total) by mouth daily with breakfast., Disp: 30 tablet, Rfl: 6   Multiple Vitamins-Minerals (MULTIVITAMIN ADULTS  PO), Take by mouth., Disp: , Rfl:   Allergies  Allergen Reactions   Latex Itching and Rash   Penicillins Swelling    Swelling in hands; states she still takes it as needed   Tegretol [Carbamazepine] Rash   Vicodin [Hydrocodone-Acetaminophen] Rash    Objective:   There were no vitals taken for this visit. AAOx3, NAD NCAT, EOMI No obvious CN deficits Coloring WNL Pt is able to speak clearly, coherently without shortness of breath or increased work of breathing.  Thought process is linear.  Mood is appropriate.   Assessment and Plan:   ADHD- new.  Pt was officially dx'd by Dr Lurline Hare.  She was doing well controlling her sxs when she was able to work from home and control her environment.  But they now want her in the office 5 days/week.  She is asking for accommodations to work from home so there are fewer distractions.  Agree that this is reasonable and even beneficial as she is likely more productive.  Note provided for work.  Pt is also interested in starting treatment for ADHD.  Discussed various options and will start low dose concerta and monitor for improvement.  Discussed appropriate use, possible abuse potential, possible side effects.  Prescription sent to pharmacy.    Annye Asa, MD 02/25/2020

## 2020-02-25 NOTE — Telephone Encounter (Signed)
Tommi Rumps called in stating that Concerta requires a PA, please advise and let pt know if this has been started.

## 2020-02-25 NOTE — Progress Notes (Signed)
I have discussed the procedure for the virtual visit with the patient who has given consent to proceed with assessment and treatment.   Pt unable to obtain vitals.   Michaela Norris L Rally Ouch, CMA     

## 2020-02-25 NOTE — Telephone Encounter (Signed)
PA began in covermymeds

## 2020-02-28 ENCOUNTER — Telehealth: Payer: BC Managed Care – PPO | Admitting: Family Medicine

## 2020-02-28 NOTE — Telephone Encounter (Signed)
Received confirmation PA approved.

## 2020-02-29 ENCOUNTER — Ambulatory Visit (INDEPENDENT_AMBULATORY_CARE_PROVIDER_SITE_OTHER): Payer: BC Managed Care – PPO | Admitting: Clinical

## 2020-02-29 DIAGNOSIS — F419 Anxiety disorder, unspecified: Secondary | ICD-10-CM | POA: Diagnosis not present

## 2020-03-03 ENCOUNTER — Telehealth: Payer: Self-pay | Admitting: Family Medicine

## 2020-03-03 NOTE — Telephone Encounter (Signed)
Called and informed. Stated an understanding.

## 2020-03-03 NOTE — Telephone Encounter (Signed)
Please advise 

## 2020-03-03 NOTE — Telephone Encounter (Signed)
Typically increased urination is not a direct side effect but it can cause dry mouth, which causes people to drink more, which causes increased urination

## 2020-03-03 NOTE — Telephone Encounter (Signed)
Called in to check status of FMLA forms

## 2020-03-03 NOTE — Telephone Encounter (Signed)
Michaela Norris called in asking if Concerta could cause increased urination?  Please advise

## 2020-03-14 ENCOUNTER — Ambulatory Visit (INDEPENDENT_AMBULATORY_CARE_PROVIDER_SITE_OTHER): Payer: BC Managed Care – PPO | Admitting: Clinical

## 2020-03-14 DIAGNOSIS — F419 Anxiety disorder, unspecified: Secondary | ICD-10-CM | POA: Diagnosis not present

## 2020-03-16 ENCOUNTER — Telehealth (HOSPITAL_COMMUNITY): Payer: Self-pay | Admitting: Psychiatry

## 2020-03-16 ENCOUNTER — Ambulatory Visit (INDEPENDENT_AMBULATORY_CARE_PROVIDER_SITE_OTHER): Payer: BC Managed Care – PPO | Admitting: Clinical

## 2020-03-16 DIAGNOSIS — F419 Anxiety disorder, unspecified: Secondary | ICD-10-CM

## 2020-03-16 NOTE — Telephone Encounter (Signed)
D:  Patient phoned re: a referral made by her psychologist (Dr. Lexine Baton).  A:  Oriented pt.  Pt inquired if she could care for her infant child while being in the virtual group daily.  Explained to patient that it is recommended that everyone find a quite area in the home, where there's no distractions while being in the groups.  Case manager asked if there wasn't anyone that could take care of the infant while she was in the groups from 9-12; pt responded that she had no one.  Encouraged pt to call case manager back if anything changes.  Mentioned that [place] may be an option with virtual groups also. R:  Pt receptive.

## 2020-03-22 ENCOUNTER — Ambulatory Visit (INDEPENDENT_AMBULATORY_CARE_PROVIDER_SITE_OTHER): Payer: BC Managed Care – PPO | Admitting: Family Medicine

## 2020-03-22 ENCOUNTER — Other Ambulatory Visit: Payer: Self-pay

## 2020-03-22 ENCOUNTER — Encounter: Payer: Self-pay | Admitting: Family Medicine

## 2020-03-22 ENCOUNTER — Other Ambulatory Visit: Payer: Self-pay | Admitting: Family Medicine

## 2020-03-22 ENCOUNTER — Telehealth: Payer: Self-pay | Admitting: General Practice

## 2020-03-22 VITALS — BP 117/77 | HR 64 | Temp 98.9°F | Resp 20 | Ht 68.0 in | Wt 234.6 lb

## 2020-03-22 DIAGNOSIS — G5791 Unspecified mononeuropathy of right lower limb: Secondary | ICD-10-CM | POA: Diagnosis not present

## 2020-03-22 DIAGNOSIS — Z23 Encounter for immunization: Secondary | ICD-10-CM | POA: Diagnosis not present

## 2020-03-22 DIAGNOSIS — E6609 Other obesity due to excess calories: Secondary | ICD-10-CM

## 2020-03-22 DIAGNOSIS — F909 Attention-deficit hyperactivity disorder, unspecified type: Secondary | ICD-10-CM | POA: Insufficient documentation

## 2020-03-22 DIAGNOSIS — Z6835 Body mass index (BMI) 35.0-35.9, adult: Secondary | ICD-10-CM | POA: Diagnosis not present

## 2020-03-22 DIAGNOSIS — Z0000 Encounter for general adult medical examination without abnormal findings: Secondary | ICD-10-CM | POA: Diagnosis not present

## 2020-03-22 DIAGNOSIS — F900 Attention-deficit hyperactivity disorder, predominantly inattentive type: Secondary | ICD-10-CM

## 2020-03-22 LAB — B12 AND FOLATE PANEL
Folate: 12.9 ng/mL (ref >5.9)
Vitamin B-12: 347 pg/mL (ref 211–911)

## 2020-03-22 LAB — LIPID PANEL
Cholesterol: 120 mg/dL (ref 0–200)
HDL: 45.6 mg/dL (ref >39.00)
LDL Cholesterol: 51 mg/dL (ref 0–99)
NonHDL: 74.35
Total CHOL/HDL Ratio: 3
Triglycerides: 117 mg/dL (ref 0.0–149.0)
VLDL: 23.4 mg/dL (ref 0.0–40.0)

## 2020-03-22 LAB — CBC WITH DIFFERENTIAL/PLATELET
Basophils Absolute: 0 10*3/uL (ref 0.0–0.1)
Basophils Relative: 0.5 % (ref 0.0–3.0)
Eosinophils Absolute: 0.1 10*3/uL (ref 0.0–0.7)
Eosinophils Relative: 1.1 % (ref 0.0–5.0)
HCT: 40.4 % (ref 36.0–46.0)
Hemoglobin: 13.7 g/dL (ref 12.0–15.0)
Lymphocytes Relative: 25.5 % (ref 12.0–46.0)
Lymphs Abs: 2.4 10*3/uL (ref 0.7–4.0)
MCHC: 33.8 g/dL (ref 30.0–36.0)
MCV: 85.6 fl (ref 78.0–100.0)
Monocytes Absolute: 0.4 10*3/uL (ref 0.1–1.0)
Monocytes Relative: 4.6 % (ref 3.0–12.0)
Neutro Abs: 6.5 10*3/uL (ref 1.4–7.7)
Neutrophils Relative %: 68.3 % (ref 43.0–77.0)
Platelets: 189 10*3/uL (ref 150.0–400.0)
RBC: 4.72 Mil/uL (ref 3.87–5.11)
RDW: 13 % (ref 11.5–15.5)
WBC: 9.5 10*3/uL (ref 4.0–10.5)

## 2020-03-22 LAB — BASIC METABOLIC PANEL
BUN: 18 mg/dL (ref 6–23)
CO2: 29 mEq/L (ref 19–32)
Calcium: 9.4 mg/dL (ref 8.4–10.5)
Chloride: 104 mEq/L (ref 96–112)
Creatinine, Ser: 0.59 mg/dL (ref 0.40–1.20)
GFR: 110.4 mL/min (ref >60.00)
Glucose, Bld: 85 mg/dL (ref 70–99)
Potassium: 4 mEq/L (ref 3.5–5.1)
Sodium: 140 mEq/L (ref 135–145)

## 2020-03-22 LAB — VITAMIN D 25 HYDROXY (VIT D DEFICIENCY, FRACTURES): VITD: 19.84 ng/mL — ABNORMAL LOW (ref 30.00–100.00)

## 2020-03-22 LAB — HEPATIC FUNCTION PANEL
ALT: 14 U/L (ref 0–35)
AST: 14 U/L (ref 0–37)
Albumin: 4.1 g/dL (ref 3.5–5.2)
Alkaline Phosphatase: 63 U/L (ref 39–117)
Bilirubin, Direct: 0.2 mg/dL (ref 0.0–0.3)
Total Bilirubin: 1.1 mg/dL (ref 0.2–1.2)
Total Protein: 6.4 g/dL (ref 6.0–8.3)

## 2020-03-22 LAB — TSH: TSH: 2.03 u[IU]/mL (ref 0.35–4.50)

## 2020-03-22 MED ORDER — GABAPENTIN 100 MG PO CAPS: 100.0000 mg | ORAL_CAPSULE | Freq: Three times a day (TID) | ORAL | 3 refills | Status: DC

## 2020-03-22 MED ORDER — METHYLPHENIDATE HCL ER (OSM) 27 MG PO TBCR: 27.0000 mg | EXTENDED_RELEASE_TABLET | ORAL | 0 refills | Status: DC

## 2020-03-22 MED FILL — GABAPENTIN 100 MG CAPSULE: 100 | 30 days supply | Qty: 90 | Fill #0

## 2020-03-22 NOTE — Patient Instructions (Addendum)
Follow up in 6-8 weeks to recheck ADHD We'll notify you of your lab results and make any changes if needed START the Gabapentin 1 tab nightly before bed and then increase to twice daily and even 3x/day as needed for nerve pain INCREASE the Concerta to 27mg  daily We'll call you with the neurology referral Continue to work on healthy diet and regular exercise- you can do it! Call and schedule pap and mammogram.  Please have them send me a copy of the report Call with any questions or concerns Stay Safe!  Stay Healthy!!!

## 2020-03-22 NOTE — Telephone Encounter (Signed)
PA began in covermymeds for Methylphenidate ER 27mg .

## 2020-03-22 NOTE — Assessment & Plan Note (Signed)
Pt has regained 16 lbs since last visit.  Discussed need for healthy diet and regular exercise.  Check labs to risk stratify.  Will follow.

## 2020-03-22 NOTE — Assessment & Plan Note (Signed)
Pt reports improved focus, ability to read faster but feels that there is still room for improvement.  Will increase Concerta to 27mg  daily and monitor for improvement.  Pt expressed understanding and is in agreement w/ plan.

## 2020-03-22 NOTE — Progress Notes (Signed)
   Subjective:    Patient ID: Michaela Norris, female    DOB: 1975/09/27, 44 y.o.   MRN: [id number]  HPI CPE- Due for pap, mammo.  UTD on COVID, Tdap.  Due for flu today.  Reviewed past medical, surgical, family and social histories.   Patient Care Team    Relationship Specialty Notifications Start End  Midge Minium, MD PCP - General Family Medicine  12/25/11    Comment: Lenon Oms, Hope Pigeon, MD Referring Physician General Surgery  11/26/17   Tyson Dense, MD Consulting Physician Obstetrics and Gynecology  11/26/17       Review of Systems Patient reports no vision/ hearing changes, adenopathy,fever, persistant/recurrent hoarseness , swallowing issues, chest pain, palpitations, edema, persistant/recurrent cough, hemoptysis, dyspnea (rest/exertional/paroxysmal nocturnal), gastrointestinal bleeding (melena, rectal bleeding), abdominal pain, significant heartburn, bowel changes, GU symptoms (dysuria, hematuria, incontinence), Gyn symptoms (abnormal  bleeding, pain),  syncope, focal weakness, memory loss, skin/hair/nail changes, abnormal bruising or bleeding, anxiety, or depression.  + 16 lb weight gain + bilateral big toe pain, R>L.  Difficult time wearing closed toed shoes.  Pt states they are painful and numb at the same time.  sxs started 'a few years' ago.  Pt has decreased sensation of R big toe, has actually cut foot and not realized it.       Objective:   Physical Exam General Appearance:    Alert, cooperative, no distress, appears stated age, obese  Head:    Normocephalic, without obvious abnormality, atraumatic  Eyes:    PERRL, conjunctiva/corneas clear, EOM's intact, fundi    benign, both eyes  Ears:    Normal TM's and external ear canals, both ears  Nose:   Deferred due to COVID  Throat:   Neck:   Supple, symmetrical, trachea midline, no adenopathy;    Thyroid: no enlargement/tenderness/nodules  Back:     Symmetric, no curvature, ROM normal, no CVA  tenderness  Lungs:     Clear to auscultation bilaterally, respirations unlabored  Chest Wall:    No tenderness or deformity   Heart:    Regular rate and rhythm, S1 and S2 normal, no murmur, rub   or gallop  Breast Exam:    Deferred to GYN  Abdomen:     Soft, non-tender, bowel sounds active all four quadrants,    no masses, no organomegaly  Genitalia:    Deferred to GYN  Rectal:    Extremities:   Extremities normal, atraumatic, no cyanosis or edema  Pulses:   2+ and symmetric all extremities  Skin:   Skin color, texture, turgor normal, no rashes or lesions  Lymph nodes:   Cervical, supraclavicular, and axillary nodes normal  Neurologic:   CNII-XII intact, normal strength, and reflexes    Throughout.  Absent sensation of R great toe          Assessment & Plan:  Neuropathy of R foot- new.  Pt reports a burning numbness and lack of sensation to R great toe.  Will check for metabolic cause- [id number] deficiency.  Start Gabapentin for symptomatic relief.  Refer to neuro for complete evaluation and tx.  Pt expressed understanding and is in agreement w/ plan.

## 2020-03-23 ENCOUNTER — Other Ambulatory Visit: Payer: Self-pay | Admitting: General Practice

## 2020-03-23 MED ORDER — VITAMIN D (ERGOCALCIFEROL) 1.25 MG (50000 UNIT) PO CAPS: 50000.0000 [IU] | ORAL_CAPSULE | ORAL | 0 refills | Status: DC

## 2020-03-23 MED FILL — VIT D2 1.25 MG (50,000 UNIT: 1.25 MG | 84 days supply | Qty: 12 | Fill #0

## 2020-03-23 MED FILL — METHYLPHENIDATE HCL ER 27 M: 27 | 30 days supply | Qty: 30 | Fill #0

## 2020-03-24 ENCOUNTER — Encounter: Payer: Self-pay | Admitting: Neurology

## 2020-03-28 ENCOUNTER — Ambulatory Visit (INDEPENDENT_AMBULATORY_CARE_PROVIDER_SITE_OTHER): Payer: BC Managed Care – PPO | Admitting: Clinical

## 2020-03-28 DIAGNOSIS — F419 Anxiety disorder, unspecified: Secondary | ICD-10-CM | POA: Diagnosis not present

## 2020-04-04 ENCOUNTER — Ambulatory Visit: Payer: BC Managed Care – PPO | Attending: Internal Medicine

## 2020-04-04 ENCOUNTER — Ambulatory Visit: Payer: BC Managed Care – PPO

## 2020-04-04 DIAGNOSIS — Z23 Encounter for immunization: Secondary | ICD-10-CM

## 2020-04-04 NOTE — Progress Notes (Signed)
   Covid-19 Vaccination Clinic  Name:  Michaela Norris    MRN: [id number] DOB: [birth year]  04/04/2020  [name] was observed post Covid-19 immunization for 15 minutes without incident. She was provided with Vaccine Information Sheet and instruction to access the V-Safe system.   [name] was instructed to call 911 with any severe reactions post vaccine: Marland Kitchen Difficulty breathing  . Swelling of face and throat  . A fast heartbeat  . A bad rash all over body  . Dizziness and weakness

## 2020-04-11 ENCOUNTER — Ambulatory Visit (INDEPENDENT_AMBULATORY_CARE_PROVIDER_SITE_OTHER): Payer: BC Managed Care – PPO | Admitting: Clinical

## 2020-04-11 DIAGNOSIS — F419 Anxiety disorder, unspecified: Secondary | ICD-10-CM | POA: Diagnosis not present

## 2020-04-25 ENCOUNTER — Ambulatory Visit (INDEPENDENT_AMBULATORY_CARE_PROVIDER_SITE_OTHER): Payer: BC Managed Care – PPO | Admitting: Clinical

## 2020-04-25 DIAGNOSIS — F419 Anxiety disorder, unspecified: Secondary | ICD-10-CM | POA: Diagnosis not present

## 2020-04-26 ENCOUNTER — Encounter: Payer: Self-pay | Admitting: Family Medicine

## 2020-05-09 ENCOUNTER — Ambulatory Visit (INDEPENDENT_AMBULATORY_CARE_PROVIDER_SITE_OTHER): Payer: BC Managed Care – PPO | Admitting: Clinical

## 2020-05-09 DIAGNOSIS — F419 Anxiety disorder, unspecified: Secondary | ICD-10-CM

## 2020-05-12 ENCOUNTER — Telehealth (INDEPENDENT_AMBULATORY_CARE_PROVIDER_SITE_OTHER): Payer: BC Managed Care – PPO | Admitting: Family Medicine

## 2020-05-12 ENCOUNTER — Other Ambulatory Visit: Payer: Self-pay | Admitting: Family Medicine

## 2020-05-12 ENCOUNTER — Encounter: Payer: Self-pay | Admitting: Family Medicine

## 2020-05-12 DIAGNOSIS — F900 Attention-deficit hyperactivity disorder, predominantly inattentive type: Secondary | ICD-10-CM | POA: Diagnosis not present

## 2020-05-12 MED ORDER — METHYLPHENIDATE HCL 10 MG PO TABS: ORAL_TABLET | ORAL | 0 refills | Status: DC

## 2020-05-12 MED ORDER — METHYLPHENIDATE HCL ER (OSM) 27 MG PO TBCR
27.0000 mg | EXTENDED_RELEASE_TABLET | ORAL | 0 refills | Status: DC
Start: 2020-05-12 — End: 2020-06-12

## 2020-05-12 MED FILL — METHYLPHENIDATE HCL ER 27 M: 27 | 30 days supply | Qty: 30 | Fill #0

## 2020-05-12 NOTE — Progress Notes (Signed)
I connected with  Michaela Norris on 05/12/20 by a video enabled telemedicine application and verified that I am speaking with the correct person using two identifiers.   I discussed the limitations of evaluation and management by telemedicine. The patient expressed understanding and agreed to proceed.

## 2020-05-12 NOTE — Progress Notes (Signed)
   Virtual Visit via Video   I connected with patient on 05/12/20 at 11:30 AM EST by a video enabled telemedicine application and verified that I am speaking with the correct person using two identifiers.  Location patient: Home Location provider: [facility], Office Persons participating in the virtual visit: Patient, Provider, [place] (Sabrina M)  I discussed the limitations of evaluation and management by telemedicine and the availability of in person appointments. The patient expressed understanding and agreed to proceed.  Subjective:   HPI:   ADHD- currently on Concerta 27mg  daily.  Pt notes 'more of a difference' w/ the increased dose.  Pt is taking medication around noon to extend the effectiveness until the end of her work day.  This makes AM far less productive.  She does note an afternoon crash- after which she will be very tired.  Denies increased anxiety, palpitations, insomnia.  ROS:   See pertinent positives and negatives per HPI.  Patient Active Problem List   Diagnosis Date Noted  . ADHD 03/22/2020  . PTSD (post-traumatic stress disorder) 12/29/2018  . PCOS (polycystic ovarian syndrome) 06/19/2016  . Obese 06/19/2016  . OSA on CPAP 03/18/2013  . BPPV (benign paroxysmal positional vertigo) 03/08/2013  . Asperger's disorder 10/07/2012  . Physical exam 02/26/2012  . Anemia 12/25/2011  . Pseudotumor cerebri 12/25/2011    Social History   Tobacco Use  . Smoking status: Never Smoker  . Smokeless tobacco: Never Used  Substance Use Topics  . Alcohol use: No    Current Outpatient Medications:  .  CALCIUM CITRATE PO, Take by mouth., Disp: , Rfl:  .  Cholecalciferol (VITAMIN D PO), Take by mouth., Disp: , Rfl:  .  ferrous sulfate 325 (65 FE) MG tablet, Take 1 tablet (325 mg total) by mouth daily with breakfast., Disp: 30 tablet, Rfl: 6 .  gabapentin (NEURONTIN) 100 MG capsule, Take 1 capsule (100 mg total) by mouth 3 (three) times daily., Disp: 90 capsule,  Rfl: 3 .  methylphenidate (CONCERTA) 27 MG PO CR tablet, Take 1 tablet (27 mg total) by mouth every morning., Disp: 30 tablet, Rfl: 0 .  Multiple Vitamins-Minerals (MULTIVITAMIN ADULTS PO), Take by mouth., Disp: , Rfl:  .  Vitamin D, Ergocalciferol, (DRISDOL) 1.25 MG (50000 UNIT) CAPS capsule, Take 1 capsule (50,000 Units total) by mouth every 7 (seven) days., Disp: 12 capsule, Rfl: 0  Allergies  Allergen Reactions  . Latex Itching and Rash  . Penicillins Swelling    Swelling in hands; states she still takes it as needed  . Tegretol [Carbamazepine] Rash  . Vicodin [Hydrocodone-Acetaminophen] Rash    Objective:   There were no vitals taken for this visit. AAOx3, NAD NCAT, EOMI No obvious CN deficits Coloring WNL Pt is able to speak clearly, coherently without shortness of breath or increased work of breathing.  Thought process is linear.  Mood is appropriate.   Assessment and Plan:   ADHD- improved w/ increased dose of Concerta but it is not lasting as long as she needs it to.  Will add short acting Ritalin in the afternoon so she can take her Concerta earlier but not worry about an afternoon crash.  Pt expressed understanding and is in agreement w/ plan.   Annye Asa, MD 05/12/2020

## 2020-05-16 MED FILL — METHYLPHENIDATE 10 MG TAB: 10 | 30 days supply | Qty: 30 | Fill #0

## 2020-05-18 ENCOUNTER — Ambulatory Visit: Payer: BC Managed Care – PPO | Admitting: Family Medicine

## 2020-05-23 ENCOUNTER — Ambulatory Visit: Payer: BC Managed Care – PPO | Admitting: Clinical

## 2020-06-06 ENCOUNTER — Ambulatory Visit: Payer: BC Managed Care – PPO | Admitting: Clinical

## 2020-06-07 ENCOUNTER — Ambulatory Visit (INDEPENDENT_AMBULATORY_CARE_PROVIDER_SITE_OTHER): Payer: BC Managed Care – PPO | Admitting: Clinical

## 2020-06-07 DIAGNOSIS — F419 Anxiety disorder, unspecified: Secondary | ICD-10-CM | POA: Diagnosis not present

## 2020-06-12 ENCOUNTER — Other Ambulatory Visit: Payer: Self-pay | Admitting: Family Medicine

## 2020-06-12 ENCOUNTER — Telehealth (INDEPENDENT_AMBULATORY_CARE_PROVIDER_SITE_OTHER): Payer: Self-pay | Admitting: Family Medicine

## 2020-06-12 ENCOUNTER — Encounter: Payer: Self-pay | Admitting: Family Medicine

## 2020-06-12 DIAGNOSIS — F900 Attention-deficit hyperactivity disorder, predominantly inattentive type: Secondary | ICD-10-CM

## 2020-06-12 MED ORDER — METHYLPHENIDATE HCL 10 MG PO TABS
ORAL_TABLET | ORAL | 0 refills | Status: DC
Start: 2020-06-12 — End: 2020-06-23

## 2020-06-12 MED ORDER — METHYLPHENIDATE HCL ER (OSM) 27 MG PO TBCR
27.0000 mg | EXTENDED_RELEASE_TABLET | ORAL | 0 refills | Status: DC
Start: 2020-06-12 — End: 2020-06-23

## 2020-06-12 MED FILL — METHYLPHENIDATE HCL ER 27 M: 27 | 30 days supply | Qty: 30 | Fill #0

## 2020-06-12 MED FILL — METHYLPHENIDATE 10 MG TAB: 10 | 30 days supply | Qty: 30 | Fill #0

## 2020-06-12 NOTE — Progress Notes (Signed)
I connected with  Michaela Norris on 06/12/20 by a video enabled telemedicine application and verified that I am speaking with the correct person using two identifiers.   I discussed the limitations of evaluation and management by telemedicine. The patient expressed understanding and agreed to proceed.

## 2020-06-12 NOTE — Progress Notes (Signed)
   Virtual Visit via Video   I connected with patient on 06/12/20 at  1:00 PM EST by a video enabled telemedicine application and verified that I am speaking with the correct person using two identifiers.  Location patient: Home Location provider: [facility], Office Persons participating in the virtual visit: Patient, Provider, [place] (Sabrina M)  I discussed the limitations of evaluation and management by telemedicine and the availability of in person appointments. The patient expressed understanding and agreed to proceed.  Subjective:   HPI:   ADHD- at last visit we added 10mg  Ritalin for her to take in the afternoon for additional concentration.  Pt reports addition of short acting medication has been very helpful.  Will take Concerta between 1-09NA and this gets her through 'most of the day'.  Will take Ritalin late afternoon to allow her to get through the rest of the day.  Crash not as severe.  Improved impulsivity.  ROS:   See pertinent positives and negatives per HPI.  Patient Active Problem List   Diagnosis Date Noted  . ADHD 03/22/2020  . PTSD (post-traumatic stress disorder) 12/29/2018  . PCOS (polycystic ovarian syndrome) 06/19/2016  . Obese 06/19/2016  . OSA on CPAP 03/18/2013  . BPPV (benign paroxysmal positional vertigo) 03/08/2013  . Asperger's disorder 10/07/2012  . Physical exam 02/26/2012  . Anemia 12/25/2011  . Pseudotumor cerebri 12/25/2011    Social History   Tobacco Use  . Smoking status: Never Smoker  . Smokeless tobacco: Never Used  Substance Use Topics  . Alcohol use: No    Current Outpatient Medications:  .  CALCIUM CITRATE PO, Take by mouth., Disp: , Rfl:  .  Cholecalciferol (VITAMIN D PO), Take by mouth., Disp: , Rfl:  .  ferrous sulfate 325 (65 FE) MG tablet, Take 1 tablet (325 mg total) by mouth daily with breakfast., Disp: 30 tablet, Rfl: 6 .  gabapentin (NEURONTIN) 100 MG capsule, Take 1 capsule (100 mg total) by mouth 3 (three)  times daily., Disp: 90 capsule, Rfl: 3 .  methylphenidate (CONCERTA) 27 MG PO CR tablet, Take 1 tablet (27 mg total) by mouth every morning., Disp: 30 tablet, Rfl: 0 .  methylphenidate (RITALIN) 10 MG tablet, Take 1 tab in the afternoon for additional symptom control, Disp: 30 tablet, Rfl: 0 .  Multiple Vitamins-Minerals (MULTIVITAMIN ADULTS PO), Take by mouth., Disp: , Rfl:  .  Vitamin D, Ergocalciferol, (DRISDOL) 1.25 MG (50000 UNIT) CAPS capsule, Take 1 capsule (50,000 Units total) by mouth every 7 (seven) days. (Patient not taking: Reported on 06/12/2020), Disp: 12 capsule, Rfl: 0  Allergies  Allergen Reactions  . Latex Itching and Rash  . Penicillins Swelling    Swelling in hands; states she still takes it as needed  . Tegretol [Carbamazepine] Rash  . Vicodin [Hydrocodone-Acetaminophen] Rash    Objective:   There were no vitals taken for this visit. AAOx3, NAD obese NCAT, EOMI No obvious CN deficits Coloring WNL Pt is able to speak clearly, coherently without shortness of breath or increased work of breathing.  Thought process is linear.  Mood is appropriate.   Assessment and Plan:   ADHD- much improved w/ addition of short acting Ritalin in the afternoon.  Pt feels sxs are better controlled, crash is less intense, and she is able to get more done.  No med changes at this time.  Will follow.   Annye Asa, MD 06/12/2020

## 2020-06-13 ENCOUNTER — Encounter: Payer: Self-pay | Admitting: Family Medicine

## 2020-06-14 ENCOUNTER — Telehealth: Payer: Self-pay | Admitting: Emergency Medicine

## 2020-06-14 NOTE — Telephone Encounter (Signed)
PA request from [facility] for Methylphenidate 10 mg QD Started PA on Cover My Meds Key: [id number] Case ID: [id number] Waiting on response from patient insurance

## 2020-06-16 ENCOUNTER — Ambulatory Visit: Payer: Self-pay | Admitting: Neurology

## 2020-06-20 ENCOUNTER — Ambulatory Visit (INDEPENDENT_AMBULATORY_CARE_PROVIDER_SITE_OTHER): Payer: BC Managed Care – PPO | Admitting: Clinical

## 2020-06-20 DIAGNOSIS — F419 Anxiety disorder, unspecified: Secondary | ICD-10-CM | POA: Diagnosis not present

## 2020-06-23 ENCOUNTER — Other Ambulatory Visit: Payer: Self-pay | Admitting: Family Medicine

## 2020-06-23 ENCOUNTER — Other Ambulatory Visit: Payer: Self-pay

## 2020-06-23 ENCOUNTER — Ambulatory Visit: Payer: BC Managed Care – PPO | Admitting: Neurology

## 2020-06-23 ENCOUNTER — Encounter: Payer: Self-pay | Admitting: Family Medicine

## 2020-06-23 ENCOUNTER — Telehealth (INDEPENDENT_AMBULATORY_CARE_PROVIDER_SITE_OTHER): Payer: Self-pay | Admitting: Family Medicine

## 2020-06-23 DIAGNOSIS — F900 Attention-deficit hyperactivity disorder, predominantly inattentive type: Secondary | ICD-10-CM

## 2020-06-23 MED ORDER — METHYLPHENIDATE HCL 10 MG PO TABS
ORAL_TABLET | ORAL | 0 refills | Status: DC
Start: 2020-06-23 — End: 2020-06-23

## 2020-06-23 MED ORDER — METHYLPHENIDATE HCL ER (OSM) 27 MG PO TBCR
27.0000 mg | EXTENDED_RELEASE_TABLET | ORAL | 0 refills | Status: DC
Start: 2020-06-23 — End: 2020-06-23

## 2020-06-23 NOTE — Progress Notes (Signed)
   Virtual Visit via Video   I connected with patient on 06/23/20 at 10:30 AM EST by a video enabled telemedicine application and verified that I am speaking with the correct person using two identifiers.  Location patient: Home Location provider: [facility], Office Persons participating in the virtual visit: Patient, Provider, [place] (Sabrina M)  I discussed the limitations of evaluation and management by telemedicine and the availability of in person appointments. The patient expressed understanding and agreed to proceed.  Subjective:   HPI:   ADHD- pt is concerned that she will have trouble getting medication due to insurance change as she switches jobs.  She isn't sure when her new insurance takes effect and is hoping for a 3 month supply to get her through the change.  ROS:   See pertinent positives and negatives per HPI.  Patient Active Problem List   Diagnosis Date Noted  . ADHD 03/22/2020  . PTSD (post-traumatic stress disorder) 12/29/2018  . PCOS (polycystic ovarian syndrome) 06/19/2016  . Obese 06/19/2016  . OSA on CPAP 03/18/2013  . BPPV (benign paroxysmal positional vertigo) 03/08/2013  . Asperger's disorder 10/07/2012  . Physical exam 02/26/2012  . Anemia 12/25/2011  . Pseudotumor cerebri 12/25/2011    Social History   Tobacco Use  . Smoking status: Never Smoker  . Smokeless tobacco: Never Used  Substance Use Topics  . Alcohol use: No    Current Outpatient Medications:  .  CALCIUM CITRATE PO, Take by mouth., Disp: , Rfl:  .  Cholecalciferol (VITAMIN D PO), Take by mouth., Disp: , Rfl:  .  ferrous sulfate 325 (65 FE) MG tablet, Take 1 tablet (325 mg total) by mouth daily with breakfast., Disp: 30 tablet, Rfl: 6 .  gabapentin (NEURONTIN) 100 MG capsule, Take 1 capsule (100 mg total) by mouth 3 (three) times daily., Disp: 90 capsule, Rfl: 3 .  methylphenidate (CONCERTA) 27 MG PO CR tablet, Take 1 tablet (27 mg total) by mouth every morning., Disp: 30  tablet, Rfl: 0 .  methylphenidate (RITALIN) 10 MG tablet, Take 1 tab in the afternoon for additional symptom control, Disp: 30 tablet, Rfl: 0 .  Multiple Vitamins-Minerals (MULTIVITAMIN ADULTS PO), Take by mouth., Disp: , Rfl:  .  Vitamin D, Ergocalciferol, (DRISDOL) 1.25 MG (50000 UNIT) CAPS capsule, Take 1 capsule (50,000 Units total) by mouth every 7 (seven) days. (Patient not taking: No sig reported), Disp: 12 capsule, Rfl: 0  Allergies  Allergen Reactions  . Latex Itching and Rash  . Penicillins Swelling    Swelling in hands; states she still takes it as needed  . Tegretol [Carbamazepine] Rash  . Vicodin [Hydrocodone-Acetaminophen] Rash    Objective:   There were no vitals taken for this visit. AAOx3, NAD NCAT, EOMI No obvious CN deficits Coloring WNL Pt is able to speak clearly, coherently without shortness of breath or increased work of breathing.  Thought process is linear.  Mood is appropriate.   Assessment and Plan:   ADHD- pt's sxs are well controlled on the Concerta 27mg  daily and the addition Ritalin 10mg  as needed for afternoon symptoms.  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  Will send 90 day supply of each but cautioned her that pharmacy and/or insurance may not improve since last scripts were just filled on 1/17.  Pt expressed understanding and is in agreement w/ plan.    Annye Asa, MD 06/23/2020

## 2020-06-23 NOTE — Progress Notes (Signed)
I connected with  Michaela Norris on 06/23/20 by a video enabled telemedicine application and verified that I am speaking with the correct person using two identifiers.   I discussed the limitations of evaluation and management by telemedicine. The patient expressed understanding and agreed to proceed.

## 2020-06-26 ENCOUNTER — Encounter: Payer: Self-pay | Admitting: Neurology

## 2020-06-26 ENCOUNTER — Ambulatory Visit: Payer: Self-pay | Admitting: Neurology

## 2020-06-26 DIAGNOSIS — Z029 Encounter for administrative examinations, unspecified: Secondary | ICD-10-CM

## 2020-07-04 ENCOUNTER — Ambulatory Visit: Payer: BC Managed Care – PPO | Admitting: Clinical

## 2020-07-07 ENCOUNTER — Ambulatory Visit (INDEPENDENT_AMBULATORY_CARE_PROVIDER_SITE_OTHER): Payer: BC Managed Care – PPO | Admitting: Clinical

## 2020-07-07 DIAGNOSIS — F419 Anxiety disorder, unspecified: Secondary | ICD-10-CM

## 2020-07-07 MED FILL — METHYLPHENIDATE 10 MG TAB: 10 | 90 days supply | Qty: 90 | Fill #0

## 2020-07-07 MED FILL — METHYLPHENIDATE HCL ER 27 M: 27 | 10 days supply | Qty: 10 | Fill #0

## 2020-07-18 ENCOUNTER — Ambulatory Visit: Payer: BC Managed Care – PPO | Admitting: Clinical

## 2020-07-21 ENCOUNTER — Ambulatory Visit (INDEPENDENT_AMBULATORY_CARE_PROVIDER_SITE_OTHER): Payer: BC Managed Care – PPO | Admitting: Clinical

## 2020-07-21 DIAGNOSIS — F419 Anxiety disorder, unspecified: Secondary | ICD-10-CM

## 2020-07-21 MED FILL — METHYLPHENIDATE HCL ER 27 M: 27 | 80 days supply | Qty: 80 | Fill #1

## 2020-07-28 ENCOUNTER — Ambulatory Visit: Payer: BC Managed Care – PPO | Admitting: Clinical

## 2020-08-01 ENCOUNTER — Ambulatory Visit: Payer: BC Managed Care – PPO | Admitting: Clinical

## 2020-08-11 ENCOUNTER — Ambulatory Visit (INDEPENDENT_AMBULATORY_CARE_PROVIDER_SITE_OTHER): Payer: Self-pay | Admitting: Clinical

## 2020-08-11 DIAGNOSIS — F419 Anxiety disorder, unspecified: Secondary | ICD-10-CM

## 2020-09-08 ENCOUNTER — Ambulatory Visit: Payer: Self-pay | Admitting: Clinical

## 2020-09-11 ENCOUNTER — Ambulatory Visit (INDEPENDENT_AMBULATORY_CARE_PROVIDER_SITE_OTHER): Payer: Self-pay | Admitting: Clinical

## 2020-09-11 DIAGNOSIS — F419 Anxiety disorder, unspecified: Secondary | ICD-10-CM

## 2020-10-06 ENCOUNTER — Ambulatory Visit (INDEPENDENT_AMBULATORY_CARE_PROVIDER_SITE_OTHER): Payer: Self-pay | Admitting: Clinical

## 2020-10-06 DIAGNOSIS — F419 Anxiety disorder, unspecified: Secondary | ICD-10-CM

## 2020-11-03 ENCOUNTER — Ambulatory Visit: Payer: Self-pay | Admitting: Clinical

## 2020-11-22 ENCOUNTER — Encounter: Payer: Self-pay | Admitting: *Deleted

## 2020-12-18 ENCOUNTER — Other Ambulatory Visit (HOSPITAL_COMMUNITY): Payer: Self-pay

## 2021-01-31 ENCOUNTER — Other Ambulatory Visit (INDEPENDENT_AMBULATORY_CARE_PROVIDER_SITE_OTHER): Payer: Self-pay | Admitting: Reproductive Endocrinology

## 2021-01-31 DIAGNOSIS — Z119 Encounter for screening for infectious and parasitic diseases, unspecified: Secondary | ICD-10-CM

## 2021-01-31 NOTE — Nursing Note (Signed)
ID labs for TDI being placed for [facility] and her spouse Laura Barton ([birth year]) per Dr.Heitmann.

## 2021-05-03 ENCOUNTER — Ambulatory Visit (INDEPENDENT_AMBULATORY_CARE_PROVIDER_SITE_OTHER): Payer: HMO | Admitting: Family Medicine

## 2021-05-24 ENCOUNTER — Encounter (INDEPENDENT_AMBULATORY_CARE_PROVIDER_SITE_OTHER): Payer: Self-pay | Admitting: Family Medicine

## 2021-05-24 ENCOUNTER — Other Ambulatory Visit: Payer: Self-pay

## 2021-05-24 ENCOUNTER — Ambulatory Visit (INDEPENDENT_AMBULATORY_CARE_PROVIDER_SITE_OTHER): Payer: Self-pay | Admitting: Family Medicine

## 2021-05-24 ENCOUNTER — Ambulatory Visit (INDEPENDENT_AMBULATORY_CARE_PROVIDER_SITE_OTHER): Payer: HMO | Admitting: Family Medicine

## 2021-05-24 ENCOUNTER — Ambulatory Visit: Payer: HMO | Attending: Family Medicine | Admitting: Family Medicine

## 2021-05-24 VITALS — BP 139/83 | HR 89 | Temp 99.2°F | Ht 68.0 in | Wt 247.0 lb

## 2021-05-24 DIAGNOSIS — Z0000 Encounter for general adult medical examination without abnormal findings: Secondary | ICD-10-CM

## 2021-05-24 DIAGNOSIS — F909 Attention-deficit hyperactivity disorder, unspecified type: Secondary | ICD-10-CM

## 2021-05-24 DIAGNOSIS — Z6837 Body mass index (BMI) 37.0-37.9, adult: Secondary | ICD-10-CM

## 2021-05-24 DIAGNOSIS — Z903 Acquired absence of stomach [part of]: Secondary | ICD-10-CM

## 2021-05-24 DIAGNOSIS — G4730 Sleep apnea, unspecified: Secondary | ICD-10-CM

## 2021-05-24 DIAGNOSIS — J029 Acute pharyngitis, unspecified: Secondary | ICD-10-CM

## 2021-05-24 LAB — POC COVID-19, FLU A/B, RSV RAPID BY PCR (RESULTS)
INFLUENZA VIRUS A, PCR 4PLEX, POC: NEGATIVE
INFLUENZA VIRUS B, PCR 4PLEX, POC: NEGATIVE
RSV, PCR 4PLEX, POC: NEGATIVE
SARS-COV-2, POC: NEGATIVE

## 2021-05-24 LAB — POC STREP RAPID BY PCR (RESULTS): STREP, PCR, POC: NOT DETECTED

## 2021-05-24 NOTE — Progress Notes (Signed)
[facility], [facility]  [address]  [address] [place] [address]  Phone: [phone]  Fax: [phone]    Encounter Date:  05/24/21  Patient ID: Laura Barton   MRN: [id number]  DOB: 12-27-1975  Age: 45 y.o.      Subjective:     Chief Complaint:   Establish Care      Laura Barton is a 45 yo female presenting to establish care. She recently moved here.     Illness: She reports that she had a fever of 100.53F this morning. She has a cough, sore throat, and myalgias. She denies loss of taste or smell. She feels weak. She has some congestion. It started yesterday. Her daughter started daycare in October. She reports they have been low level sick since then, but this is worse.     ADHD: She is currently on Methylphenidate 27 mg qday and Ritalin 10 mg PRN. She just got diagnosed after neuropsych testing last summer.     PHQ Questionnaire  Little interest or pleasure in doing things.: Not at all  Feeling down, depressed, or hopeless: Not at all  PHQ 2 Total: 0  Trouble falling or staying asleep, or sleeping too much.: Not at all  Feeling tired or having little energy: Not at all  Poor appetite or overeating: Several Days  Feeling bad about yourself/ that you are a failure in the past 2 weeks?: Not at all  Trouble concentrating on things in the past 2 weeks?: More than half the days  Moving/Speaking slowly or being fidgety or restless  in the past 2 weeks?: Not at all  Thoughts that you would be better off DEAD, or of hurting yourself in some way.: Not at all  If you checked off any problems, how difficult have these problems made it for you to do your work, take care of things at home, or get along with other people?: Not difficult at all  PHQ 9 Total: 3  Interpretation of Total Score: 0-4 No depression     GAD 7 Total Score  Gad-7 Score Total: 3  Interpretation: 0-4, normal     S/p gastric sleeve: She lost 100 lbs before and 100 lbs since. "I feel healthier than I ever have."           Past Medical History:    Diagnosis Date    ADHD (attention deficit hyperactivity disorder)     Anxiety     Autism spectrum disorder     Learning disability     Sleep apnea        Current Outpatient Medications   Medication Sig    methylphenidate HCl (RITALIN) 10 mg Oral Tablet Take 1 Tablet (10 mg total) by mouth Once per day as needed    methylphenidate HCl 27 mg Oral Tablet Extended Rel 24 hr Take 1 Tablet (27 mg total) by mouth Once a day       Allergies   Allergen Reactions    Latex Itching and Rash    Carbamazepine Rash    Hydrocodone-Acetaminophen Rash    Penicillins Swelling     Swelling in hands; states she still takes it as needed  Swelling in hands; states she still takes it as needed         Past Surgical History:   Procedure Laterality Date    ANKLE SURGERY Right     2000    GALLBLADDER SURGERY  2007    removal    HX GASTRIC SLEEVE  August 2019          Social History     Socioeconomic History    Marital status: Married   Tobacco Use    Smoking status: Never    Smokeless tobacco: Never   Substance and Sexual Activity    Alcohol use: Never    Drug use: Never    Sexual activity: Yes     Partners: Male, Female     Comment: spouse Transmasculine   Social History Narrative    No Hx of international travel        Family Medical History:     Problem Relation (Age of Onset)    Cancer Maternal Grandfather    Depression Sister    Drug Abuse Sister, Brother    Healthy Mother, Maternal Uncle, Maternal Grandmother            Review of Systems   Constitutional: Positive for fever and malaise/fatigue.   HENT: Positive for sore throat.    Respiratory: Negative for shortness of breath.    Cardiovascular: Negative for chest pain.   Musculoskeletal: Positive for myalgias.   All other systems reviewed and are negative.        Objective:   Vitals: BP 139/83    Pulse 89    Temp 37.3 C (99.2 F) (Temporal)    Ht 1.727 m (5\' 8" )    Wt 112 kg (247 lb)    LMP 05/03/2021    SpO2 99%    BMI 37.56 kg/m     Physical  Exam  Constitutional:       Appearance: She is obese.   HENT:      Head: Normocephalic and atraumatic.      Nose: Congestion present.      Mouth/Throat:      Pharynx: No oropharyngeal exudate.   Eyes:      General: No scleral icterus.  Cardiovascular:      Rate and Rhythm: Normal rate and regular rhythm.   Pulmonary:      Effort: Pulmonary effort is normal. No respiratory distress.      Breath sounds: Normal breath sounds. No wheezing or rales.   Abdominal:      General: Bowel sounds are normal. There is no distension.      Tenderness: There is no abdominal tenderness. There is no guarding.   Musculoskeletal:      Right lower leg: No edema.      Left lower leg: No edema.   Skin:     General: Skin is warm and dry.   Neurological:      Cranial Nerves: No cranial nerve deficit.   Psychiatric:         Mood and Affect: Mood normal.         Assessment & Plan:    Laura Barton is a 45 y.o. female presenting to establish care.     (Z00.00) Encounter for medical examination to establish care  (primary encounter diagnosis)  Plan: Moved from [place]    (G47.30) Sleep apnea  Plan: Refer to Sleep Disorder - [facility]        Will refer for management.     (Z90.3) H/O gastric sleeve  Plan: VITAMIN E, SERUM, VITAMIN K1, SERUM, VITAMIN B1        (THIAMIN), WHOLE BLOOD, VITAMIN A, SERUM,         VITAMIN B12, FOLATE, IRON, ZINC, SERUM,         SELENIUM, SERUM, CBC/DIFF, COMPREHENSIVE  METABOLIC PANEL, NON-FASTING, THYROID         STIMULATING HORMONE WITH FREE T4 REFLEX,         COPPER, SERUM, HGA1C (HEMOGLOBIN A1C WITH EST         AVG GLUCOSE), LIPID PANEL, VITAMIN D 25 TOTAL         Will get lab work     (F90.9) ADHD (attention deficit hyperactivity disorder)  Plan: Discussed that we would need formal neuropsych testing to be abel to write her Methylphenidate 27 mg qday and Ritalin 10 mg qday PRN.     (J02.9) Sore throat  Plan: PERFORM POC COVID-19, FLU A/B, RSV RAPID BY PCR        - CLINIC ONLY, POCT RAPID STREP A              The  patient has been educated and verbalized understanding the services provided during this visit.     RTC in 3 months for annual exam and ADHD    Franklyn Lor, MD 05/24/2021, 10:39

## 2021-05-30 ENCOUNTER — Other Ambulatory Visit: Payer: Self-pay

## 2021-05-30 ENCOUNTER — Encounter (INDEPENDENT_AMBULATORY_CARE_PROVIDER_SITE_OTHER): Payer: Self-pay

## 2021-05-30 ENCOUNTER — Ambulatory Visit (INDEPENDENT_AMBULATORY_CARE_PROVIDER_SITE_OTHER): Payer: HMO

## 2021-05-30 VITALS — BP 110/80 | HR 66 | Temp 98.3°F | Ht 68.0 in

## 2021-05-30 DIAGNOSIS — J329 Chronic sinusitis, unspecified: Secondary | ICD-10-CM

## 2021-05-30 DIAGNOSIS — H10023 Other mucopurulent conjunctivitis, bilateral: Secondary | ICD-10-CM

## 2021-05-30 MED ORDER — NEOMYCIN 3.5 MG/G-POLYMYXIN B 10,000 UNIT/G-DEXAMETH 0.1 % EYE OINT
TOPICAL_OINTMENT | Freq: Three times a day (TID) | OPHTHALMIC | 0 refills | Status: AC
Start: 2021-05-30 — End: 2021-06-04

## 2021-05-30 MED ORDER — DOXYCYCLINE HYCLATE 100 MG CAPSULE
100.0000 mg | ORAL_CAPSULE | Freq: Two times a day (BID) | ORAL | 0 refills | Status: AC
Start: 2021-05-30 — End: 2021-06-06

## 2021-05-30 NOTE — Progress Notes (Signed)
Laura Barton  DOB: [birth year], 47 y.o.   MRN: [id number]  Date of encounter: 05/30/2021    Chief Complaint   Patient presents with   . Eye Pain     Started last night, both eyes red and hurt.  Slight drainage.  Itchy.     . Cold Symptoms     Seen by Dr. Loleta Chance last Thursday, nasal congestion, sore throat and cough.  Four plex negative.  Not feeling any better.  Was not recommended any meds.  Has only taken Tylenol Friday and Sat.       HPI:    Laura Barton is a 46 y.o. female patient who presents today with complaints of eye pain and cold symptoms. She reports that last Wednesday she developed cold symptoms including sore throat, nasal congestion, and cough. She was seen on Thursday and swabbed for COVID, Flu, and RSV all of which were negative. She reports that on Sunday she was feeling a bit better but last night she felt ill again and also developed eye pain, redness, itching, and discharge. She reports that the only medication she has taken is Tylenol.     The history is provided by the patient.    Review of Systems: (All ROS negative unless item in bold)    Constitutional: negative for fevers, chills, malaise, lethargy, and weight changes  Eyes: negative for visual changes +eye pain, redness, and drainage  Ears, nose, mouth, throat, and face: negative for hearing loss, ringing in ears, earaches, sinus pain/pressure, hoarseness, and oral soreness +nasal congestion, sore throat  Respiratory: negative for shortness of breath, wheezing, snoring, orthopnea, hemoptysis, and night sweats  +cough  Cardiovascular: negative for chest pressure/discomfort, palpitations, syncope/near syncope, and swelling of lower extremities  Gastrointestinal: negative for dysphagia, nausea, vomiting, heartburn, diarrhea, constipation, abdominal pain, and blood in stool  Genitourinary: negative for dysuria, hematuria, difficulty initiating urination, nocturia, frequency, and incomplete voiding  Integument/breast: negative for itching,  dryness, rash, changing moles, growths, and hair/nail changes  Musculoskeletal: negative for muscle and joint pain/swelling  Neurological: negative for headache, tremors, unsteady gait, numbness/tingling, seizures, and stroke   Hematologic/lymphatic: negative for easy bleeding/bruising, swollen glands  Endocrine: negative for excessive thirst/hunger, increased urination, hot/cold intolerance   Psych: negative for memory problems, difficulty sleeping, anxiety, depression, and SI/HI     Past Medical History:   Diagnosis Date   . ADHD (attention deficit hyperactivity disorder)    . Anxiety    . Autism spectrum disorder    . Learning disability    . Sleep apnea      Past Surgical History:   Procedure Laterality Date   . ANKLE SURGERY Right     20 00   . GALLBLADDER SURGERY  2007    removal   . HX GASTRIC SLEEVE      August 2019     Social History     Socioeconomic History   . Marital status: Married     Spouse name: Not on file   . Number of children: Not on file   . Years of education: Not on file   . Highest education level: Not on file   Occupational History   . Not on file   Tobacco Use   . Smoking status: Never   . Smokeless tobacco: Never   Substance and Sexual Activity   . Alcohol use: Never   . Drug use: Never   . Sexual activity: Yes     Partners: Male, Female     Comment:  spouse Transmasculine   Other Topics Concern   . Not on file   Social History Narrative    No Hx of international travel      Social Determinants of Health     Financial Resource Strain: Not on file   Food Insecurity: Not on file   Transportation Needs: Not on file   Physical Activity: Not on file   Stress: Not on file   Intimate Partner Violence: Not on file   Housing Stability: Not on file     Family Medical History:     Problem Relation (Age of Onset)    Cancer Maternal Grandfather    Depression Sister    Drug Abuse Sister, Brother    Healthy Mother, Maternal Uncle, Maternal Grandmother        Allergies   Allergen Reactions   . Latex  Itching and Rash   . Carbamazepine Rash   . Hydrocodone-Acetaminophen Rash   . Penicillins Swelling     Swelling in hands; states she still takes it as needed  Swelling in hands; states she still takes it as needed       Current Outpatient Medications   Medication Sig   . doxycycline hyclate (VIBRAMYCIN) 100 mg Oral Capsule Take 1 Capsule (100 mg total) by mouth Twice daily for 7 days   . methylphenidate HCl (RITALIN) 10 mg Oral Tablet Take 1 Tablet (10 mg total) by mouth Once per day as needed   . methylphenidate HCl 27 mg Oral Tablet Extended Rel 24 hr Take 1 Tablet (27 mg total) by mouth Once a day   . neomycin-polymyxin-dexamethasone (MAXITROL) 3.5 mg/g-10,000 unit/g-0.1 % Ophthalmic Ointment Apply to both eye Three times a day for 5 days     Objective:  Vitals:    05/30/21 1120   BP: 110/80   Pulse: 66   Temp: 36.8 C (98.3 F)   TempSrc: Oral   SpO2: 98%   Height: 1.727 m (5\' 8" )      General: Patient is alert. Cooperative. Appears in good health and stated age. No acute distress.   HENT: Normocephalic, atraumatic. Sclera non-icteric. Nares patent without erythema, edema, or exudate. Oral mucosa intact and moist. No oral lesions. Good dentition. Pharynx without erythema, edema, or exudate. +inflammed and erythematous conjunctiva with serous drainage. Patient reports maxillary sinuses are TTP.  Ears: Pinna, tragus, and external auditory canal are non-tender and without swelling or exudate bilaterally. Bilateral tympanic membranes are normal in appearance, no noted edema or exudate. Positive cone of light. Hearing is intact.   Neck: Supple. Trachea midline. No thyromegaly or lymphadenopathy.  Lungs: Clear to auscultation bilaterally. No use of accessory muscles. No adventitious sounds. No cyanosis or clubbing.    Cardiovascular: Normal S1, S2. No murmur, gallop, or rub appreciated. Carotids without bruit. No JVD. No edema appreciated.  Abdomen: Soft, non-tender. Bowel sounds normal. Non-distended. No  hepatosplenomegaly or CVA tenderness.  Musculoskeletal: No joint swelling, redness, or warmth appreciated. Walks independently. Steady gait.   Skin: Skin warm and dry. No rashes.  Neurologic: No tremor. Sensation intact to light touch. PERRLA. EOM intact.   Lymphatics: No cervical or supraclavicular adenopathy.  Psychiatric: Alert and oriented x3. Normal affect, behavior, memory, thought content, judgement, and speech.    Assessment/Plan:    1. Sinusitis, unspecified chronicity, unspecified location  - Start doxycycline hyclate (VIBRAMYCIN) 100 mg Oral Capsule; Take 1 Capsule (100 mg total) by mouth Twice daily for 7 days  Dispense: 14 Capsule; Refill: 0  2. Pink eye disease of both eyes  - Patient reports that to due her ASD she cannot use eye drops but can tolerate eye ointment  - Start neomycin-polymyxin-dexamethasone (MAXITROL) 3.5 mg/g-10,000 unit/g-0.1 % Ophthalmic Ointment; Apply to both eye Three times a day for 5 days  Dispense: 1 Each; Refill: 0    The patient was given ample opportunity to ask questions and those questions were answered to the patient's satisfaction. The patient was encouraged to be involved in their own care, and all diagnoses, medications, and medication side-effects were discussed. The patient was told to contact me with any additional questions or concerns, and to go to the emergency department in an emergency.      The patient has been educated and verbalized understanding regarding the services provided during this visit.    Return if symptoms worsen or fail to improve.  RTC should symptoms worsen or fail to improve    Evette Cristal, FNP-C  05/30/2021, 11:59  [place], [facility]  [place] [id number]  Dept: [phone]  Dept Fax: [phone]  Loc: [phone]

## 2021-05-31 ENCOUNTER — Ambulatory Visit (INDEPENDENT_AMBULATORY_CARE_PROVIDER_SITE_OTHER): Payer: HMO | Admitting: Family Medicine

## 2021-06-08 ENCOUNTER — Encounter (INDEPENDENT_AMBULATORY_CARE_PROVIDER_SITE_OTHER): Payer: Self-pay | Admitting: Family Medicine

## 2021-06-08 MED ORDER — METHYLPHENIDATE 10 MG TABLET
10.0000 mg | ORAL_TABLET | Freq: Every morning | ORAL | 0 refills | Status: AC
Start: 2021-08-07 — End: 2021-09-05

## 2021-06-08 MED ORDER — METHYLPHENIDATE ER 27 MG TABLET,EXTENDED RELEASE 24 HR
27.0000 mg | EXTENDED_RELEASE_TABLET | Freq: Every morning | ORAL | 0 refills | Status: AC
Start: 2021-07-08 — End: 2021-08-06

## 2021-06-08 MED ORDER — METHYLPHENIDATE ER 27 MG TABLET,EXTENDED RELEASE 24 HR
27.0000 mg | EXTENDED_RELEASE_TABLET | Freq: Every morning | ORAL | 0 refills | Status: AC
Start: 2021-06-08 — End: 2021-07-07

## 2021-06-08 MED ORDER — METHYLPHENIDATE ER 27 MG TABLET,EXTENDED RELEASE 24 HR
27.0000 mg | EXTENDED_RELEASE_TABLET | Freq: Every morning | ORAL | 0 refills | Status: AC
Start: 2021-08-07 — End: 2021-09-05

## 2021-06-08 MED ORDER — METHYLPHENIDATE 10 MG TABLET
10.0000 mg | ORAL_TABLET | Freq: Every morning | ORAL | 0 refills | Status: DC
Start: 2021-07-08 — End: 2021-08-04

## 2021-06-08 MED ORDER — METHYLPHENIDATE 10 MG TABLET
10.0000 mg | ORAL_TABLET | Freq: Every morning | ORAL | 0 refills | Status: AC
Start: 2021-06-08 — End: 2021-07-07

## 2021-06-08 NOTE — Telephone Encounter (Signed)
Obie Dredge, LPN [id number] AM EST      ----- Message -----  From: Vista Lawman  Sent: 06/08/2021 7:44 AM EST  To: [facility] Clinical Support Staff  Subject: Neuropsych Evaluation     Thank you so much. I was wondering if Dr Berdine Addison has had time to review the documentation and if so, would she be able to refill my medications?  Thanks again,  [place]

## 2021-06-22 ENCOUNTER — Ambulatory Visit: Payer: HMO | Attending: Family Medicine | Admitting: Family Medicine

## 2021-06-22 ENCOUNTER — Other Ambulatory Visit: Payer: Self-pay

## 2021-06-22 VITALS — BP 134/80 | HR 69 | Temp 97.6°F | Resp 14 | Ht 68.0 in | Wt 247.0 lb

## 2021-06-22 DIAGNOSIS — E669 Obesity, unspecified: Secondary | ICD-10-CM

## 2021-06-22 DIAGNOSIS — G4730 Sleep apnea, unspecified: Secondary | ICD-10-CM | POA: Insufficient documentation

## 2021-06-22 NOTE — Progress Notes (Signed)
Name: Laura Barton MRN:  [id number]   Date: 06/22/2021 Age: [birth year] 46 y.o.          TODAY's VISIT 06/22/2021 10:12:    Patient is a 46 y.o. female who presented today c/o hx of OSA dx in 2014 in [place]. She did not have sleep issues but had episodes of psychosis, anxiety, depression. But these sx resolved with CPAP use. Believe she was on CPAP 11. Does not resolution of dreams of drowning and waking up with sore throat. She alternated between nasal pillow and a full face mask. Has had a 200lb weight loss from dieting and from gastric sleeve bypass. No repeat sleep study since weight loss.  Current device is broken and she would like a replacement. Does endorse some nonrestorative sleep when not using PAP. But denies excessive daytime sleepiness.      PREVIOUS SLEEP STUDY PREVIOUS SLEEP MEDS/INTERVENTIONS   2014 from [facility] REPORT:   DME COMPANY:   DATE of PAP DEVICE:       SYMPTOMS SCREENING/QUESTIONNAIRES    Snoring No  Witnessed Apnea No  Awakenings with palpsNo /sweatNo/gasping-chokingNo/urination No  AM Headaches No  EDS/napping No  Bruxism No  GERD No  Rhinitis No  RLS No  Parasomnias No  Cataplexy No  Sleep attacks No  Sleep paralysis No  Hypnagogic/Hypnopompic Hallucination No  Insomnia No  Nightmares No  Hx of TBI No  Wt changes:     Epworth Sleepiness Scale:  0/24         Allergies   Allergen Reactions   . Latex Itching and Rash   . Carbamazepine Rash   . Hydrocodone-Acetaminophen Rash   . Penicillins Swelling     Swelling in hands; states she still takes it as needed  Swelling in hands; states she still takes it as needed      Current Outpatient Medications   Medication Sig Dispense Refill   . methylphenidate HCl (CONCERTA) 27 mg Oral Tablet Extended Rel 24 hr Take 1 Tablet (27 mg total) by mouth Every morning for 29 days 30 Tablet 0   . [START ON 07/08/2021] methylphenidate HCl (CONCERTA) 27 mg Oral Tablet Extended Rel 24 hr Take 1 Tablet (27 mg total) by mouth Every morning for 29 days 30 Tablet 0    . [START ON 08/07/2021] methylphenidate HCl (CONCERTA) 27 mg Oral Tablet Extended Rel 24 hr Take 1 Tablet (27 mg total) by mouth Every morning for 29 days 30 Tablet 0   . methylphenidate HCl (RITALIN) 10 mg Oral Tablet Take 1 Tablet (10 mg total) by mouth Once per day as needed     . methylphenidate HCl (RITALIN) 10 mg Oral Tablet Take 1 Tablet (10 mg total) by mouth Every morning with breakfast for 29 days 30 Tablet 0   . [START ON 07/08/2021] methylphenidate HCl (RITALIN) 10 mg Oral Tablet Take 1 Tablet (10 mg total) by mouth Every morning with breakfast for 29 days 30 Tablet 0   . [START ON 08/07/2021] methylphenidate HCl (RITALIN) 10 mg Oral Tablet Take 1 Tablet (10 mg total) by mouth Every morning with breakfast for 29 days 30 Tablet 0   . methylphenidate HCl 27 mg Oral Tablet Extended Rel 24 hr Take 1 Tablet (27 mg total) by mouth Once a day       No current facility-administered medications for this visit.      Past Medical History:   Diagnosis Date   . ADHD (attention deficit hyperactivity disorder)    .  Anxiety    . Autism spectrum disorder    . Learning disability    . Sleep apnea         Past Surgical History:   Procedure Laterality Date   . ANKLE SURGERY Right     2000   . GALLBLADDER SURGERY  2007    removal   . HX GASTRIC SLEEVE      August 2019          Social History     Socioeconomic History   . Marital status: Married     Spouse name: Not on file   . Number of children: Not on file   . Years of education: Not on file   . Highest education level: Not on file   Occupational History   . Not on file   Tobacco Use   . Smoking status: Never   . Smokeless tobacco: Never   Substance and Sexual Activity   . Alcohol use: Never   . Drug use: Never   . Sexual activity: Yes     Partners: Male, Female     Comment: spouse Transmasculine   Other Topics Concern   . Not on file   Social History Narrative    No Hx of international travel      Social Determinants of Health     Financial Resource Strain: Not on file    Food Insecurity: Not on file   Transportation Needs: Not on file   Physical Activity: Not on file   Stress: Not on file   Intimate Partner Violence: Not on file   Housing Stability: Not on file    Family Medical History:     Problem Relation (Age of Onset)    Cancer Maternal Grandfather    Depression Sister    Drug Abuse Sister, Brother    Healthy Mother, Maternal Uncle, Maternal Grandmother             PHYSICAL EXAM   VS reviewed   GEN: NAD. Vital signs reviewed.    Neuro: no neuro focal deficit noted  HEENT: PERLA, EOMI, Mallampati III, high arched hard palate, wide scalloped tongue. Tonsils +1  Neck: no JVD noted, Neck size 39cm  EXT: No cyanosis or clubbing.     PERTINENT LABS             ASSESSMENT and PLAN   OSA   -Recommend HST due to significant weight loss of 200lb  -was with [facility] but desires more local DME; will consider [facility] once we receive HST results.   -discussed importance and benefit of PAP adherence for OSA. Discussed cardiovascular sequelae of untreated OSA.   -Advised no driving when drowsy, and to pull over to nap if needed before returning to drive. Pt states understanding and agrees with plan.   - RTC in pending HST for follow up     The total face to face, labs/results review, charting time was 30 minutes for this encounter; greater than 50% of the visit was spent on counseling and coordination of care.           06/22/2021 10:12   Aileen Pilot, DO  06/22/2021, 10:12

## 2021-07-04 ENCOUNTER — Encounter (INDEPENDENT_AMBULATORY_CARE_PROVIDER_SITE_OTHER): Payer: Self-pay | Admitting: Family Medicine

## 2021-07-04 MED ORDER — METHYLPHENIDATE LA 20 MG BIPHASIC 50-50 CAPSULE,EXTENDED RELEASE
20.0000 mg | ORAL_CAPSULE | Freq: Every day | ORAL | 0 refills | Status: DC
Start: 2021-07-04 — End: 2021-08-04

## 2021-07-04 NOTE — Telephone Encounter (Signed)
Reatha Harps, RN 07/04/2021 9:19 AM EST      ----- Message -----  From: Laura Barton  Sent: 07/03/2021 9:58 PM EST  To: [facility] Clinical Support Staff  Subject: Neuropsych Evaluation     Hi Dr. Loleta Chance,  I have been unable to fill my prescription for methylphenidate hcl er 27mg . All of the local pharmacies are backordered. Is it possible for me to try a similar medication?  Thanks for your help!  

## 2021-08-03 ENCOUNTER — Other Ambulatory Visit (INDEPENDENT_AMBULATORY_CARE_PROVIDER_SITE_OTHER): Payer: Self-pay | Admitting: Family Medicine

## 2021-08-03 NOTE — Telephone Encounter (Signed)
LOV 05/30/2021  NOV no new appointment scheduled.  Refill request for Ritalin.  . methylphenidate HCl (RITALIN) 10 mg Oral Tablet Take 1 Tablet (10 mg total) by mouth Once per day as needed

## 2021-08-04 ENCOUNTER — Other Ambulatory Visit (INDEPENDENT_AMBULATORY_CARE_PROVIDER_SITE_OTHER): Payer: Self-pay | Admitting: Family Medicine

## 2021-08-06 MED ORDER — METHYLPHENIDATE LA 20 MG BIPHASIC 50-50 CAPSULE,EXTENDED RELEASE
20.0000 mg | ORAL_CAPSULE | Freq: Every day | ORAL | 0 refills | Status: DC
Start: 2021-08-06 — End: 2021-09-25

## 2021-08-06 MED ORDER — METHYLPHENIDATE 10 MG TABLET
10.0000 mg | ORAL_TABLET | Freq: Every morning | ORAL | 0 refills | Status: AC
Start: 2021-08-06 — End: 2021-09-05

## 2021-08-06 NOTE — Telephone Encounter (Signed)
LOV:05/30/2021    NOV:n/a     Medication Requested:  Ritalin 20mg    Ritalin 10mg    Copied from LOV:     . methylphenidate HCl (RITALIN) 10 mg Oral Tablet Take 1 Tablet (10 mg total) by mouth Once per day as needed           , LPN  , 15:59

## 2021-08-31 ENCOUNTER — Inpatient Hospital Stay
Admission: RE | Admit: 2021-08-31 | Discharge: 2021-08-31 | Disposition: A | Discharge status: Home / Self Care | Payer: HMO | Source: Ambulatory Visit | Attending: Family Medicine | Admitting: Family Medicine

## 2021-08-31 ENCOUNTER — Other Ambulatory Visit: Payer: Self-pay

## 2021-08-31 DIAGNOSIS — G4730 Sleep apnea, unspecified: Secondary | ICD-10-CM | POA: Insufficient documentation

## 2021-08-31 DIAGNOSIS — E669 Obesity, unspecified: Secondary | ICD-10-CM | POA: Insufficient documentation

## 2021-09-01 DIAGNOSIS — E669 Obesity, unspecified: Secondary | ICD-10-CM

## 2021-09-04 ENCOUNTER — Ambulatory Visit (HOSPITAL_COMMUNITY): Payer: Self-pay

## 2021-09-07 ENCOUNTER — Encounter (HOSPITAL_COMMUNITY): Payer: Self-pay

## 2021-09-07 ENCOUNTER — Telehealth (HOSPITAL_COMMUNITY): Payer: Self-pay | Admitting: Family Medicine

## 2021-09-07 NOTE — Telephone Encounter (Signed)
MA sent NORMAL RESULTS LETTER to patients home letting them know that the sleep study done on was Normal and any questions to call the [facility]...Firas Guardado, MA  09/07/2021, 13:36

## 2021-09-21 ENCOUNTER — Other Ambulatory Visit (INDEPENDENT_AMBULATORY_CARE_PROVIDER_SITE_OTHER): Payer: Self-pay | Admitting: Family Medicine

## 2021-09-24 ENCOUNTER — Encounter (INDEPENDENT_AMBULATORY_CARE_PROVIDER_SITE_OTHER): Payer: HMO

## 2021-09-25 ENCOUNTER — Telehealth: Payer: HMO | Admitting: Family Medicine

## 2021-09-25 ENCOUNTER — Encounter (INDEPENDENT_AMBULATORY_CARE_PROVIDER_SITE_OTHER): Payer: Self-pay | Admitting: Family Medicine

## 2021-09-25 DIAGNOSIS — F909 Attention-deficit hyperactivity disorder, unspecified type: Secondary | ICD-10-CM

## 2021-09-25 MED ORDER — METHYLPHENIDATE LA 20 MG BIPHASIC 50-50 CAPSULE,EXTENDED RELEASE
20.0000 mg | ORAL_CAPSULE | Freq: Every day | ORAL | 0 refills | Status: DC
Start: 2021-09-25 — End: 2021-10-02

## 2021-09-25 MED ORDER — METHYLPHENIDATE 10 MG TABLET
10.0000 mg | ORAL_TABLET | Freq: Every day | ORAL | 0 refills | Status: DC | PRN
Start: 2021-09-25 — End: 2022-01-29

## 2021-09-25 NOTE — Progress Notes (Signed)
[facility]  TELEMEDICINE CONSULTATION     Subjective:   Patient ID:  Laura Barton is a pleasant 46 y.o. female.    Chief Complaint: ADHD    Due to efforts to minimize exposure to COVID-19, a telemedicine encounter was conducted for this visit. The patient/family initiated a request for virtual check-in and is established with this practice.  Verbal consent for this service was obtained from the patient/family. I personally offered the service to the patient, and obtained verbal consent to provide this service.      History of Present Illness:  The patient is a 46 year old female who is an established patient at the [facility] who follows with Dr. Berdine Addison who presents today for follow up on ADHD.     TELEMEDICINE DOCUMENTATION:    Patient Location:  MyChart video visit from home address: [place] [address]    Patient/family aware of provider location:  yes  Patient/family consent for telemedicine:  yes  Examination observed and performed by:  Wilburn Mylar, MD    ADHD primarily inattentive:   She is currently on Methylphenidate 20 mg qday and Ritalin 10 mg PRN. She completed neuropsych testing August 4,5 2021 and it has been scanned to the chart. Patient reports she is doing well on the medication and feels her symptoms are well controlled. Patient reports she was unaware she had to be seen every 3 months but she will make sure she is seen every 3 months from now on.     The history is provided by the patient.    I reviewed and confirmed the patient's past medical history taken by the nurse or medical assistant with the addition of the following:    Allergies:     Allergies   Allergen Reactions   . Latex Itching and Rash   . Carbamazepine Rash   . Hydrocodone-Acetaminophen Rash   . Penicillins Swelling     Swelling in hands; states she still takes it as needed  Swelling in hands; states she still takes it as needed           Medications:   Methylphenidate (RITALIN LA) 20 mg Oral Capsule, Multiphasic  Rel.50-50, Take 1 Capsule (20 mg total) by mouth Once a day  methylphenidate HCl (RITALIN) 10 mg Oral Tablet, Take 1 Tablet (10 mg total) by mouth Once per day as needed  methylphenidate HCl 27 mg Oral Tablet Extended Rel 24 hr, Take 1 Tablet (27 mg total) by mouth Once a day    No facility-administered medications prior to visit.        Immunization History:     There is no immunization history on file for this patient.      Past Medical History:     Past Medical History:   Diagnosis Date   . ADHD (attention deficit hyperactivity disorder)    . Anxiety    . Autism spectrum disorder    . Learning disability    . Sleep apnea      Past Surgical History:     Past Surgical History:   Procedure Laterality Date   . ANKLE SURGERY Right     2000   . GALLBLADDER SURGERY  2007    removal   . HX GASTRIC SLEEVE      August 2019     Family History:     Family Medical History:     Problem Relation (Age of Onset)    Cancer Maternal Grandfather  Depression Sister    Drug Abuse Sister, Brother    Healthy Mother, Maternal Uncle, Maternal Grandmother        Social History:   Laura Barton  reports that she has never smoked. She has never used smokeless tobacco. She reports being sexually active and has had partner(s) who are female and female. She reports that she does not drink alcohol and does not use drugs.       Review of Systems:  General: (-) pain. (-) fevers (-) chills. (-) weight loss. (-) fatigue.  Lymphatic: (-) palpable masses. (-) night sweats.  Heme: (-) easy bruising (-) bleeding. (-) recurrent infections.   HEENT. (-) vision changes (-) hearing changes. (-) dysphagia. (-) sore throat.   Heart: (-) chest pain. (-) palpitation. (-) orthopnea. (-) lower extremity edema.   Lungs: (-) dyspnea (on exertion) (-) hemoptysis. (-) cough.   Abdomen: (-) poor appetite. (-) abdominal pain. (-) nausea (-) vomiting. (-) diarrhea. (-) constipation.   GU: (-) dysuria (-) Urgency. (-) Hematuria.   MS. (-) joint pain (-) ext swelling. (-)  Back pain.   Dermatologic: (-) rashes. (-) pruritus.   Psychiatric: (-) Depression. (-) anxiety. (-) insomnia.   Neurologic: (-) headaches. (-) neuropathy. (-) weakness. (-) memory problems.    Other than ROS in HPI, all other systems are negative.      Objective:   Constitutional:  Alert, well developed, well nourished, appears in no acute distress   HEENT:            Head:    NC/AT                               Eyes:     Sclera anicteric, conjunctiva not injected, EOMI  Neck:                             Supple with normal ROM  Pulmonary:                   No respiratory distress   Neurological:                Alert, oriented x 3  Skin:                               pink, no jaundice, no pallor  Psychiatric:                   Normal mood, affect, behavior, judgment, and thought content        Assessment & Plan:     1. ADHD (attention deficit hyperactivity disorder)  - stable continue on Ritalin LA 20 mg daily and fast acting ritalin 10 mg daily.   - neuropsych testing scanned to chart  - controlled substance agreement on file  - patient must be seen every 3 months for refills.   - Methylphenidate (RITALIN LA) 20 mg Oral Capsule, Multiphasic Rel.50-50; Take 1 Capsule (20 mg total) by mouth Once a day  Dispense: 30 Capsule; Refill: 0  - methylphenidate HCl (RITALIN) 10 mg Oral Tablet; Take 1 Tablet (10 mg total) by mouth Once per day as needed  Dispense: 30 Tablet; Refill: 0    ENCOUNTER DIAGNOSES     ICD-10-CM   1. ADHD (attention deficit hyperactivity disorder)  F90.9  Health Maintenance   Topic Date Due   . Hepatitis B Vaccine (1 of 3 - 3-dose series) Never done   . HIV Screening  Never done   . Pap smear  Never done   . Covid-19 Vaccine (2 - Booster for [employer] series) 09/06/2019   . Influenza Vaccine (Season Ended) 01/25/2022   . Depression Screening  05/24/2022   . Adult Tdap-Td (2 - Td or Tdap) 06/19/2026   . Meningococcal Vaccine  Aged Out   . Pneumococcal Vaccine, Age 88-64  Aged Out       Return in  about 3 months (around 12/26/2021), or if symptoms worsen or fail to improve, for annual exam with Dr. Berdine Addison .    The patient was given ample opportunity to ask questions and those questions were answered to the patient's satisfaction. The patient was encouraged to be involved in their own care, and all diagnoses, medications, and medication side-effects were discussed. The patient was told to contact me with any additional questions or concerns, and to go to the emergency department in an emergency.     The patient has been educated and verbalized understanding regarding the services provided during this visit.      Wilburn Mylar, MD  09/25/2021, 08:31  [address]  [place] [address]  [phone]

## 2021-10-01 ENCOUNTER — Ambulatory Visit (INDEPENDENT_AMBULATORY_CARE_PROVIDER_SITE_OTHER): Payer: Self-pay | Admitting: Family Medicine

## 2021-10-01 NOTE — Telephone Encounter (Signed)
Regarding: refill  ----- Message from Luciana Axe sent at 10/01/2021  2:23 PM EDT -----  Victoriano Lain, MD    methylphenidate HCl (RITALIN) 10 mg Oral Tablet 30 Tablet 0 09/25/2021    Sig - Route: Take 1 Tablet (10 mg total) by mouth Once per day as needed - Oral   Sent to pharmacy as: methylphenidate 10 mg tablet (RITALIN)   Class: E-Rx   Earliest Fill Date: 09/25/2021     Preferred Pharmacy       [facility] [id number] - [place], [place] ([place])    [place] [address]    Phone: [phone] Fax: [phone]    Hours: Not open 24 hours

## 2021-10-01 NOTE — Telephone Encounter (Signed)
Medication already ordered on 5/2    Rea [employer], LPN  12/29/5362, 68:03

## 2021-10-02 ENCOUNTER — Other Ambulatory Visit (INDEPENDENT_AMBULATORY_CARE_PROVIDER_SITE_OTHER): Payer: Self-pay | Admitting: Family Medicine

## 2021-10-02 DIAGNOSIS — F909 Attention-deficit hyperactivity disorder, unspecified type: Secondary | ICD-10-CM

## 2021-10-02 NOTE — Telephone Encounter (Signed)
Regarding: rx resend, regular pharm doesn't have  ----- Message from Heinz Knuckles sent at 10/02/2021  3:51 PM EDT -----  Quintin Alto, MD    [facility] does not have rx    Please resend to new pharm    Methylphenidate (RITALIN LA) 20 mg Oral Capsule, Multiphasic Rel.50-50 30 Capsule     Preferred Pharmacy       [facility] [id number] - [place], [place] - [address] AT [facility]   [address]    [address] [place] [address]    Phone: [phone] Fax: [phone]    Hours: Not open 24 hours

## 2021-10-04 MED ORDER — METHYLPHENIDATE LA 20 MG BIPHASIC 50-50 CAPSULE,EXTENDED RELEASE
20.0000 mg | ORAL_CAPSULE | Freq: Every day | ORAL | 0 refills | Status: DC
Start: 2021-10-04 — End: 2021-10-26

## 2021-10-18 ENCOUNTER — Encounter (HOSPITAL_BASED_OUTPATIENT_CLINIC_OR_DEPARTMENT_OTHER): Payer: Self-pay

## 2021-10-18 NOTE — Progress Notes (Signed)
Sleep results were explained to this patient and she said that she will call and schedule an in lab study in the fall. Charlena Cross RPSGT

## 2021-10-26 ENCOUNTER — Other Ambulatory Visit (INDEPENDENT_AMBULATORY_CARE_PROVIDER_SITE_OTHER): Payer: Self-pay | Admitting: Family Medicine

## 2021-10-26 DIAGNOSIS — F909 Attention-deficit hyperactivity disorder, unspecified type: Secondary | ICD-10-CM

## 2021-10-26 MED ORDER — METHYLPHENIDATE LA 20 MG BIPHASIC 50-50 CAPSULE,EXTENDED RELEASE
20.0000 mg | ORAL_CAPSULE | Freq: Every day | ORAL | 0 refills | Status: DC
Start: 2021-10-26 — End: 2021-12-06

## 2021-10-26 NOTE — Telephone Encounter (Signed)
LOV 09/25/21  NOV 12/28/21    1. ADHD (attention deficit hyperactivity disorder)  - stable continue on Ritalin LA 20 mg daily and fast acting ritalin 10 mg daily.   - neuropsych testing scanned to chart  - controlled substance agreement on file  - patient must be seen every 3 months for refills.   - Methylphenidate (RITALIN LA) 20 mg Oral Capsule, Multiphasic Rel.50-50; Take 1 Capsule (20 mg total) by mouth Once a day  Dispense: 30 Capsule; Refill: 0  - methylphenidate HCl (RITALIN) 10 mg Oral Tablet; Take 1 Tablet (10 mg total) by mouth Once per day as needed  Dispense: 30 Tablet; Refill: 0    Orders pended for provider signature.  Chales Salmon, RN

## 2021-11-15 ENCOUNTER — Encounter (INDEPENDENT_AMBULATORY_CARE_PROVIDER_SITE_OTHER): Payer: Self-pay | Admitting: Family Medicine

## 2021-11-15 ENCOUNTER — Ambulatory Visit (INDEPENDENT_AMBULATORY_CARE_PROVIDER_SITE_OTHER): Payer: HMO | Admitting: Family Medicine

## 2021-11-15 ENCOUNTER — Other Ambulatory Visit: Payer: Self-pay

## 2021-11-15 VITALS — BP 130/58 | HR 60 | Temp 97.5°F | Ht 68.0 in | Wt 245.0 lb

## 2021-11-15 DIAGNOSIS — J320 Chronic maxillary sinusitis: Secondary | ICD-10-CM

## 2021-11-15 MED ORDER — DOXYCYCLINE HYCLATE 100 MG TABLET
100.0000 mg | ORAL_TABLET | Freq: Two times a day (BID) | ORAL | 0 refills | Status: AC
Start: 2021-11-15 — End: 2021-11-25

## 2021-11-15 NOTE — Progress Notes (Signed)
[facility]  FAMILY MEDICINE, [facility]  [address]  [place] [address]  [phone]      Encounter Date: 11/15/2021  3:45 PM EDT     Name: Laura Barton  Age: 46 y.o.  DOB: Mar 24, 1976  Sex: female    Chief Complaint:   Chief Complaint   Patient presents with   . Sinus Pain   . Sinus Pressure         Laura Barton is a 46 yo female patient of Dr Loleta Chance who presents today with complaints of pain and pressure in her face  She feels she has a sinus infection  She had a cold for 2 weeks that seemed to be resolving and then developed sinus pain and pressure No fever  Nasal drainage is green  She has tried sudafed  It made her thirsty  Tried Mucinex once w/o benefit  Pain is in Maxillary                History:  Family Medical History:     Problem Relation (Age of Onset)    Cancer Maternal Grandfather    Depression Sister    Drug Abuse Sister, Brother    Healthy Mother, Maternal Uncle, Maternal Grandmother        Past Medical History:   Diagnosis Date   . ADHD (attention deficit hyperactivity disorder)    . Anxiety    . Autism spectrum disorder    . Learning disability    . Sleep apnea      Past Surgical History:   Procedure Laterality Date   . ANKLE SURGERY Right     2000   . GALLBLADDER SURGERY  2007    removal   . HX GASTRIC SLEEVE      August 2019     Patient Active Problem List    Diagnosis   . Sleep apnea   . H/O gastric sleeve   . ADHD (attention deficit hyperactivity disorder)         Review of Systems   All other systems reviewed and are negative.       Current Outpatient Medications   Medication Sig   . doxycycline 100 mg Oral Tablet Take 1 Tablet (100 mg total) by mouth Twice daily for 10 days   . methylphenidate (RITALIN LA) 20 mg Oral Capsule Biphasic Rel.50-50 Take 1 Capsule (20 mg total) by mouth Once a day   . methylphenidate HCl (RITALIN) 10 mg Oral Tablet Take 1 Tablet (10 mg total) by mouth Once per day as needed       Examination  Vitals: BP (!) 130/58   Pulse 60    Temp 36.4 C (97.5 F) (Thermal Scan)   Ht 1.727 m (5\' 8" )   Wt 111 kg (245 lb)   SpO2 97%   BMI 37.25 kg/m         Physical Exam  Constitutional:       General: She is not in acute distress.     Appearance: She is obese.   HENT:      Head: Normocephalic and atraumatic.      Right Ear: Ear canal normal.      Left Ear: Ear canal normal.      Ears:      Comments: Both TM dull     Nose: Congestion present.      Mouth/Throat:      Mouth: Mucous membranes are moist.      Pharynx: Oropharynx  is clear.   Eyes:      General: No scleral icterus.  Cardiovascular:      Rate and Rhythm: Normal rate and regular rhythm.      Heart sounds: No murmur heard.  Pulmonary:      Effort: Pulmonary effort is normal. No respiratory distress.      Breath sounds: Normal breath sounds. No wheezing.   Musculoskeletal:      Right lower leg: No edema.      Left lower leg: No edema.   Skin:     General: Skin is warm and dry.      Coloration: Skin is not jaundiced or pale.   Neurological:      General: No focal deficit present.      Mental Status: She is alert and oriented to person, place, and time.         Ortho Exam    Assessment and Plan      ICD-10-CM    1. Maxillary sinusitis, unspecified chronicity  J32.0 doxycycline 100 mg Oral Tablet        Rx Doxycycline 100 mg bid for 10 days    Saline NS    OTC meds for symptom relief  Orders Placed This Encounter   . doxycycline 100 mg Oral Tablet       The patient was given ample opportunity to ask questions and those questions were answered to the patient's satisfaction. The patient was encouraged to be involved in their own care, and all diagnoses, medications, and medication side-effects were discussed. The patient was told to contact me with any additional questions or concerns, and to go to the emergency department in an emergency.     The patient has been educated and verbalized understanding regarding the services provided during this visit.    Return if symptoms worsen or fail to  improve.    Wynne Dust, MD

## 2021-12-06 ENCOUNTER — Other Ambulatory Visit (INDEPENDENT_AMBULATORY_CARE_PROVIDER_SITE_OTHER): Payer: Self-pay | Admitting: Family Medicine

## 2021-12-06 DIAGNOSIS — F909 Attention-deficit hyperactivity disorder, unspecified type: Secondary | ICD-10-CM

## 2021-12-06 NOTE — Telephone Encounter (Signed)
LOV 11/15/2021  NOV 12/28/2021  Refill request for Ritalin.  . methylphenidate (RITALIN LA) 20 mg Oral Capsule Biphasic Rel.50-50 Take 1 Capsule (20 mg total) by mouth Once a day   . methylphenidate HCl (RITALIN) 10 mg Oral Tablet Take 1 Tablet (10 mg total) by mouth Once per day as needed

## 2021-12-11 ENCOUNTER — Other Ambulatory Visit (INDEPENDENT_AMBULATORY_CARE_PROVIDER_SITE_OTHER): Payer: Self-pay | Admitting: Family Medicine

## 2021-12-11 DIAGNOSIS — F909 Attention-deficit hyperactivity disorder, unspecified type: Secondary | ICD-10-CM

## 2021-12-11 MED ORDER — METHYLPHENIDATE LA 20 MG BIPHASIC 50-50 CAPSULE,EXTENDED RELEASE
20.0000 mg | ORAL_CAPSULE | Freq: Every day | ORAL | 0 refills | Status: DC
Start: 2021-12-11 — End: 2022-01-29

## 2021-12-12 ENCOUNTER — Ambulatory Visit (INDEPENDENT_AMBULATORY_CARE_PROVIDER_SITE_OTHER): Payer: Self-pay | Admitting: Family Medicine

## 2021-12-12 ENCOUNTER — Other Ambulatory Visit: Payer: Self-pay

## 2021-12-12 NOTE — Telephone Encounter (Addendum)
Already sent in another encounter.  Chales Salmon, RN      Regarding: rx refill  ----- Message from Lysle Pearl sent at 12/12/2021 11:26 AM EDT -----  Doctor Name:  Quintin Alto, MD     Date of last appointment: 11/15/2021    Next scheduled visit: 12/28/2021    Medication Requested:     methylphenidate (RITALIN LA) 20 mg Oral Capsule, Multiphasic Rel.50-50 Biphasic Rel.50-50    Preferred Pharmacy      [facility] [id number] - [place], [place] - [address] AT [place]   [address]    [address] [place] [address]    Phone: [phone] Fax: [phone]    Hours: Not open 24 hours

## 2021-12-28 ENCOUNTER — Ambulatory Visit (INDEPENDENT_AMBULATORY_CARE_PROVIDER_SITE_OTHER): Payer: Self-pay | Admitting: Family Medicine

## 2022-01-29 ENCOUNTER — Ambulatory Visit (INDEPENDENT_AMBULATORY_CARE_PROVIDER_SITE_OTHER): Payer: HMO | Admitting: Family Medicine

## 2022-01-29 ENCOUNTER — Encounter (INDEPENDENT_AMBULATORY_CARE_PROVIDER_SITE_OTHER): Payer: Self-pay | Admitting: Family Medicine

## 2022-01-29 DIAGNOSIS — F909 Attention-deficit hyperactivity disorder, unspecified type: Secondary | ICD-10-CM

## 2022-01-29 MED ORDER — METHYLPHENIDATE 10 MG TABLET
10.0000 mg | ORAL_TABLET | Freq: Every day | ORAL | 0 refills | Status: DC | PRN
Start: 2022-01-29 — End: 2022-02-19

## 2022-01-29 MED ORDER — METHYLPHENIDATE LA 20 MG BIPHASIC 50-50 CAPSULE,EXTENDED RELEASE
20.0000 mg | ORAL_CAPSULE | Freq: Every day | ORAL | 0 refills | Status: DC
Start: 2022-01-29 — End: 2022-02-19

## 2022-01-29 NOTE — Telephone Encounter (Signed)
Tamera Reason, LPN 05/30/4313 40:08 AM EDT    LOV 6/23 medication Ritalin  ----- Message -----  From: Janeal Holmes  Sent: 01/29/2022 11:27 AM EDT  To: [facility] Clinical Support Staff  Subject: Medication appointment     Hello, I had to cancel my appointment at the last minute this morning because my two year old was admitted to [facility] with respiratory distress. Is there anything I can do to be seen/refill medication before November? That was the next available appointment. Thank you!

## 2022-02-19 ENCOUNTER — Encounter (INDEPENDENT_AMBULATORY_CARE_PROVIDER_SITE_OTHER): Payer: Self-pay | Admitting: Family Medicine

## 2022-02-19 ENCOUNTER — Telehealth: Payer: HMO | Admitting: Family Medicine

## 2022-02-19 DIAGNOSIS — F909 Attention-deficit hyperactivity disorder, unspecified type: Secondary | ICD-10-CM

## 2022-02-19 DIAGNOSIS — F419 Anxiety disorder, unspecified: Secondary | ICD-10-CM

## 2022-02-19 MED ORDER — BUSPIRONE 7.5 MG TABLET
7.5000 mg | ORAL_TABLET | Freq: Two times a day (BID) | ORAL | 1 refills | Status: DC | PRN
Start: 2022-02-19 — End: 2023-04-08

## 2022-02-19 MED ORDER — METHYLPHENIDATE 10 MG TABLET
10.0000 mg | ORAL_TABLET | Freq: Every day | ORAL | 0 refills | Status: DC | PRN
Start: 2022-02-19 — End: 2023-04-08

## 2022-02-19 MED ORDER — METHYLPHENIDATE LA 20 MG BIPHASIC 50-50 CAPSULE,EXTENDED RELEASE
20.0000 mg | ORAL_CAPSULE | Freq: Every day | ORAL | 0 refills | Status: DC
Start: 2022-02-19 — End: 2023-04-08

## 2022-02-19 NOTE — Progress Notes (Signed)
FAMILY MEDICINE, [facility]  [address]  [place] [address]  Phone: [phone]  Fax: [phone]    Encounter Date:  02/19/22  Patient ID: Laura Barton   MRN: [id number]  DOB: [birth year]  Age: 46 y.o.      Subjective:     Chief Complaint:   ADHD  TELEMEDICINE DOCUMENTATION:    Patient Location:  MyChart video visit from home address: [address]  [place] [address]    Patient/family aware of provider location:  yes  Patient/family consent for telemedicine:  yes  Examination observed and performed by:  Franklyn Lor, MD     Laura Barton is a 46 yo female presenting for follow-up.     ADHD: She is currently on Ritalin LA 20 mg qday and Ritalin 10 mg qday PRN. "I just passed my 3 year anniversary of working since being off disability." She feels like this is a primary stressor. He daughter was recently in the PICU. "When I am home, I have really good regulation skills. At work is a challenge." She is having hip and lower back pain, which she attributes to stress.     PHQ Questionnaire  Little interest or pleasure in doing things.: Not at all  Feeling down, depressed, or hopeless: Not at all  PHQ 2 Total: 0  Trouble falling or staying asleep, or sleeping too much.: Not at all  Feeling tired or having little energy: Not at all  Poor appetite or overeating: Not at all  Feeling bad about yourself/ that you are a failure in the past 2 weeks?: Not at all  Trouble concentrating on things in the past 2 weeks?: More than half the days  Moving/Speaking slowly or being fidgety or restless  in the past 2 weeks?: Not at all  Thoughts that you would be better off DEAD, or of hurting yourself in some way.: Not at all  If you checked off any problems, how difficult have these problems made it for you to do your work, take care of things at home, or get along with other people?: Not difficult at all  PHQ 9 Total: 2  Interpretation of Total Score: 0-4 No depression     GAD 7 Total Score  Gad-7 Score Total:  4  Interpretation: 0-4, normal     TELEMEDICINE DOCUMENTATION:    Patient Location:  MyChart video visit from home address: [address]  [place] [address]    Patient/family aware of provider location:  yes  Patient/family consent for telemedicine:  yes  Examination observed and performed by:  Franklyn Lor, MD     Past Medical History:   Diagnosis Date    ADHD (attention deficit hyperactivity disorder)     Anxiety     Autism spectrum disorder     Learning disability     Sleep apnea            Current Outpatient Medications   Medication Sig    methylphenidate (RITALIN LA) 20 mg Oral Capsule, Multiphasic Rel.50-50 Biphasic Rel.50-50 Take 1 Capsule (20 mg total) by mouth Once a day    methylphenidate HCl (RITALIN) 10 mg Oral Tablet Take 1 Tablet (10 mg total) by mouth Once per day as needed       Allergies   Allergen Reactions    Latex Itching and Rash    Carbamazepine Rash    Hydrocodone-Acetaminophen Rash    Penicillins Swelling     Swelling in hands; states she still takes it as  needed  Swelling in hands; states she still takes it as needed         Past Surgical History:   Procedure Laterality Date    ANKLE SURGERY Right     2000    GALLBLADDER SURGERY  2007    removal    HX GASTRIC SLEEVE      August 2019           Social History     Socioeconomic History    Marital status: Married   Tobacco Use    Smoking status: Never    Smokeless tobacco: Never   Substance and Sexual Activity    Alcohol use: Never    Drug use: Never    Sexual activity: Yes     Partners: Male, Female     Comment: spouse Transmasculine   Social History Narrative    No Hx of international travel        Family Medical History:       Problem Relation (Age of Onset)    Cancer Maternal Grandfather    Depression Sister    Drug Abuse Sister, Brother    Healthy Mother, Maternal Uncle, Maternal Grandmother              Review of Systems   Respiratory:  Negative for shortness of breath.    Cardiovascular:  Negative for chest pain.   Musculoskeletal:   Positive for myalgias.   Psychiatric/Behavioral:  The patient is nervous/anxious.          Objective:   Vitals:   Physical Exam  Constitutional:       Appearance: She is obese.   Pulmonary:      Effort: No respiratory distress.   Skin:     Coloration: Skin is not pale.   Psychiatric:         Mood and Affect: Mood normal.           Assessment & Plan:    Laura Barton is a 46 y.o. female presenting for follow-up.     (F90.9) ADHD (attention deficit hyperactivity disorder)  (primary encounter diagnosis)  Plan: methylphenidate HCl (RITALIN) 10 mg Oral         Tablet, methylphenidate (RITALIN LA) 20 mg Oral        Capsule, Multiphasic Rel.50-50 Biphasic         Rel.50-50         Continue Ritalin 20 mg qday and 10 mg PRN.     (F41.9) Anxiety  Plan: Will start Buspar 7.5 mg BID PRN. Discussed that if she needs daily medication that we could try medication such as Zoloft  Previous medication trials: Klonopin, Xanax, Lithium (misdiagnosed),     Depression screening is negative. PHQ 2 Total: 0         The patient has been educated and verbalized understanding the services provided during this visit.     RTC in 6 weeks for annual exam, ADHD, & anxiety     Leda Quail, MD 02/19/2022, 11:34

## 2022-04-05 ENCOUNTER — Other Ambulatory Visit: Payer: Self-pay

## 2022-04-05 ENCOUNTER — Encounter (INDEPENDENT_AMBULATORY_CARE_PROVIDER_SITE_OTHER): Payer: Self-pay | Admitting: Family Medicine

## 2022-04-05 ENCOUNTER — Ambulatory Visit (INDEPENDENT_AMBULATORY_CARE_PROVIDER_SITE_OTHER): Payer: HMO | Admitting: Family Medicine

## 2022-04-05 ENCOUNTER — Ambulatory Visit: Payer: HMO | Attending: Family Medicine | Admitting: Family Medicine

## 2022-04-05 ENCOUNTER — Other Ambulatory Visit (INDEPENDENT_AMBULATORY_CARE_PROVIDER_SITE_OTHER): Payer: HMO

## 2022-04-05 VITALS — BP 118/84 | HR 82 | Temp 98.1°F | Ht 68.0 in | Wt 245.0 lb

## 2022-04-05 DIAGNOSIS — Z0000 Encounter for general adult medical examination without abnormal findings: Secondary | ICD-10-CM

## 2022-04-05 DIAGNOSIS — Z903 Acquired absence of stomach [part of]: Secondary | ICD-10-CM | POA: Insufficient documentation

## 2022-04-05 DIAGNOSIS — G932 Benign intracranial hypertension: Secondary | ICD-10-CM

## 2022-04-05 DIAGNOSIS — F419 Anxiety disorder, unspecified: Secondary | ICD-10-CM

## 2022-04-05 DIAGNOSIS — Z1211 Encounter for screening for malignant neoplasm of colon: Secondary | ICD-10-CM

## 2022-04-05 DIAGNOSIS — H5710 Ocular pain, unspecified eye: Secondary | ICD-10-CM

## 2022-04-05 DIAGNOSIS — Z23 Encounter for immunization: Secondary | ICD-10-CM

## 2022-04-05 DIAGNOSIS — F909 Attention-deficit hyperactivity disorder, unspecified type: Secondary | ICD-10-CM

## 2022-04-05 LAB — COMPREHENSIVE METABOLIC PANEL, NON-FASTING
ALBUMIN: 3.9 g/dL (ref 3.5–5.0)
ALKALINE PHOSPHATASE: 99 U/L (ref 40–110)
ALT (SGPT): 15 U/L (ref 8–22)
ANION GAP: 6 mmol/L (ref 4–13)
AST (SGOT): 18 U/L (ref 8–45)
BILIRUBIN TOTAL: 1.1 mg/dL (ref 0.3–1.3)
BUN/CREA RATIO: 17 (ref 6–22)
BUN: 11 mg/dL (ref 8–25)
CALCIUM: 9.9 mg/dL (ref 8.6–10.2)
CHLORIDE: 104 mmol/L (ref 96–111)
CO2 TOTAL: 29 mmol/L (ref 22–30)
CREATININE: 0.63 mg/dL (ref 0.60–1.05)
ESTIMATED GFR - FEMALE: >90 mL/min/BSA (ref >=60)
GLUCOSE: 98 mg/dL (ref 65–125)
POTASSIUM: 3.7 mmol/L (ref 3.5–5.1)
PROTEIN TOTAL: 7.7 g/dL (ref 6.4–8.3)
SODIUM: 139 mmol/L (ref 136–145)

## 2022-04-05 LAB — FOLATE: FOLATE: 17.1 ng/mL (ref 7.0–31.0)

## 2022-04-05 LAB — CBC WITH DIFF
BASOPHIL #: <0.1 10*3/uL (ref <=0.20)
BASOPHIL %: 1 %
EOSINOPHIL #: 0.2 10*3/uL (ref <=0.50)
EOSINOPHIL %: 2 %
HCT: 41.9 % (ref 34.8–46.0)
HGB: 13.5 g/dL (ref 11.5–16.0)
IMMATURE GRANULOCYTE #: <0.1 10*3/uL (ref <0.10)
IMMATURE GRANULOCYTE %: 1 % (ref 0–1)
LYMPHOCYTE #: 1.97 10*3/uL (ref 1.00–4.80)
LYMPHOCYTE %: 21 %
MCH: 26.8 pg (ref 26.0–32.0)
MCHC: 32.2 g/dL (ref 31.0–35.5)
MCV: 83.3 fL (ref 78.0–100.0)
MONOCYTE #: 0.52 10*3/uL (ref 0.20–1.10)
MONOCYTE %: 6 %
MPV: 11.7 fL (ref 8.7–12.5)
NEUTROPHIL #: 6.71 10*3/uL (ref 1.50–7.70)
NEUTROPHIL %: 69 %
PLATELETS: 253 10*3/uL (ref 150–400)
RBC: 5.03 10*6/uL (ref 3.85–5.22)
RDW-CV: 13.5 % (ref 11.5–15.5)
WBC: 9.5 10*3/uL (ref 3.7–11.0)

## 2022-04-05 LAB — HGA1C (HEMOGLOBIN A1C WITH EST AVG GLUCOSE)
ESTIMATED AVERAGE GLUCOSE: 100 mg/dL
HEMOGLOBIN A1C: 5.1 % (ref 4.0–5.6)

## 2022-04-05 LAB — VITAMIN B12: VITAMIN B 12: 804 pg/mL (ref 200–900)

## 2022-04-05 LAB — THYROID STIMULATING HORMONE WITH FREE T4 REFLEX: TSH: 0.785 u[IU]/mL (ref 0.350–4.940)

## 2022-04-05 LAB — IRON: IRON: 49 ug/dL (ref 45–170)

## 2022-04-05 NOTE — Nursing Note (Signed)
Immunization administered       Name Date Dose VIS Date Route    HEPATITIS B VIRUS RECOMB VACCINE 04/05/2022 20 mcg 10/05/2021 Intramuscular    Site: Left deltoid    Given By: Catrina Fellenz, Ambulatory Care Assistant    Manufacturer: [employer]    Lot: [id number]    NDC: 58160082143    Influenza Vaccine, 6 month-adult 04/05/2022 0.5 mL 12/31/2019 Intramuscular    Site: Right deltoid    Given By: Abanoub Hanken, Ambulatory Care Assistant    Manufacturer: [employer]    Lot: [id number]    NDC: 58160090941

## 2022-04-05 NOTE — Progress Notes (Signed)
[facility], [facility]  [address]  [place] [address]  Phone: [phone]  Fax: [phone]    Encounter Date:  04/05/22  Patient ID: Laura Barton   MRN: [id number]  DOB: [birth year]  Age: 46 y.o.      Subjective:     Chief Complaint:   Annual Exam ( 6 weeks for annual exam, ADHD, & anxiety)      [name] is a 46 yo female presenting for annual exam. She arrives 14 minutes late for her appointment.     HCM: Immunizations: getting flu & Hep B; Pap smear: at outside facility a couple of years ago; Mammogram: ordered; Colon cancer screening: FIT test ordered; HIV/STI/Hep C screening: declined; Diet & Exercise: "I was doing really well after my gastric sleeve in 2019, and then the pandemic hit." Her daughter was born in 2021. She eats healthy. "I cannot figure out how to fit in exercise."      ADHD, anxiety: She is currently Ritalin LA 20 mg qday and Ritalin 10 mg qday PRN. "I feel like I am just overwhelmed and confused." She has not tried Buspar yet. She has noticed worsening anxiety with sensory changes.     PHQ Questionnaire  Little interest or pleasure in doing things.: Not at all  Feeling down, depressed, or hopeless: Not at all  PHQ 2 Total: 0  Trouble falling or staying asleep, or sleeping too much.: Not at all  Feeling tired or having little energy: Not at all  Poor appetite or overeating: Not at all  Feeling bad about yourself/ that you are a failure in the past 2 weeks?: Not at all  Trouble concentrating on things in the past 2 weeks?: Not at all  Moving/Speaking slowly or being fidgety or restless  in the past 2 weeks?: Not at all  Thoughts that you would be better off DEAD, or of hurting yourself in some way.: Not at all  PHQ 9 Total: 0  Interpretation of Total Score: 0-4 No depression     GAD 7 Total Score  Gad-7 Score Total: 7  Interpretation: 5-9, mild anxiety    Eye pain: She reports having pain behind her eye. She has a history of pseudotumor cerebri            Past Medical  History:   Diagnosis Date    ADHD (attention deficit hyperactivity disorder)     Anxiety     Autism spectrum disorder     Hip pain     Learning disability     Low back pain     Sleep apnea            Current Outpatient Medications   Medication Sig    busPIRone (BUSPAR) 7.5 mg Oral Tablet Take 1 Tablet (7.5 mg total) by mouth Twice per day as needed    methylphenidate (RITALIN LA) 20 mg Oral Capsule, Multiphasic Rel.50-50 Biphasic Rel.50-50 Take 1 Capsule (20 mg total) by mouth Once a day    methylphenidate HCl (RITALIN) 10 mg Oral Tablet Take 1 Tablet (10 mg total) by mouth Once per day as needed       Allergies   Allergen Reactions    Latex Itching and Rash    Carbamazepine Rash    Hydrocodone-Acetaminophen Rash    Penicillins Swelling     Swelling in hands; states she still takes it as needed  Swelling in hands; states she still takes it as needed  Past Surgical History:   Procedure Laterality Date    ANKLE SURGERY Right     2000    GALLBLADDER SURGERY  2007    removal    HX GASTRIC SLEEVE      August 2019           Social History     Socioeconomic History    Marital status: Married   Tobacco Use    Smoking status: Never    Smokeless tobacco: Never   Substance and Sexual Activity    Alcohol use: Never    Drug use: Never    Sexual activity: Yes     Partners: Male, Female     Comment: spouse Transmasculine   Social History Narrative    No Hx of international travel        Family Medical History:       Problem Relation (Age of Onset)    Cancer Maternal Grandfather    Depression Sister    Drug Abuse Sister, Brother    Healthy Mother, Maternal Uncle, Maternal Grandmother              Review of Systems   Eyes:  Positive for pain.   Respiratory:  Negative for shortness of breath.    Cardiovascular:  Negative for chest pain.   Neurological:  Negative for dizziness and headaches.   All other systems reviewed and are negative.        Objective:   Vitals: BP 118/84   Pulse 82   Temp 36.7 C (98.1 F) (Thermal Scan)    Ht 1.727 m (5\' 8" )   Wt 111 kg (245 lb)   SpO2 97%   BMI 37.25 kg/m       Physical Exam  Constitutional:       Appearance: She is obese.   HENT:      Head: Normocephalic and atraumatic.      Nose: No congestion.      Mouth/Throat:      Pharynx: No oropharyngeal exudate.   Cardiovascular:      Rate and Rhythm: Normal rate and regular rhythm.   Pulmonary:      Effort: Pulmonary effort is normal. No respiratory distress.      Breath sounds: Normal breath sounds. No wheezing or rales.   Abdominal:      General: Bowel sounds are normal. There is no distension.      Tenderness: There is no abdominal tenderness. There is no guarding.   Musculoskeletal:      Right lower leg: No edema.      Left lower leg: No edema.   Psychiatric:         Mood and Affect: Mood normal.       Assessment & Plan:    [name] is a 46 y.o. female presenting for annual exam.     (Z00.00) Annual physical exam  (primary encounter diagnosis)  Plan: MAMMO BILATERAL SCREENING-ADDL VIEWS/BREAST 54         AS REQ BY RAD, FIT ([facility] IMMUNOCHEMICAL         TESTING)          Health Maintenance   Topic Date Due    HIV Screening  Never done    Pap smear  Never done    Colonoscopy  Never done    Covid-19 Vaccine (3 - 2023-24 season) 01/25/2022    Hepatitis B Vaccine (2 of 3 - 19+ 3-dose series) 05/03/2022    Depression Screening  04/06/2023  Adult Tdap-Td (2 - Td or Tdap) 06/19/2026    Influenza Vaccine  Completed    Meningococcal Vaccine  Aged Out    Pneumococcal Vaccine, Age 76-64  Aged Out       (Z23) Need for vaccination against hepatitis B virus  Plan:     (Z12.11) Colon cancer screening  Plan: FIT ([facility] IMMUNOCHEMICAL TESTING)        FIT test ordered.     (H57.10) Eye pain & (G93.2) Pseudotumor cerebri  Plan: Refer to [facility]        Will refer to neuro-ophthalmology     (F41.9) Anxiety & (F90.9) ADHD (attention deficit hyperactivity disorder)  Plan: Discussed trying Buspar 7.5 mg BID PRN. Continue  Ritalin LA 20 mg qday and Ritalin 10 mg qday PRN  Previous medication trials: Klonopin, Xanax, Lithium (misdiagnosed),        The patient has been educated and verbalized understanding the services provided during this visit.     RTC in 3 months for ADHD & anxiety     Franklyn Lor, MD 04/05/2022, 15:40

## 2022-04-08 LAB — SELENIUM, SERUM: SELENIUM: 119 ug/L (ref 63–160)

## 2022-04-08 LAB — VITAMIN D 25 TOTAL: VITAMIN D, 25OH: 22 ng/mL — ABNORMAL LOW (ref 30–100)

## 2022-04-09 LAB — COPPER, SERUM: COPPER: 120 ug/dL (ref 70–175)

## 2022-04-09 LAB — ZINC, SERUM: ZINC: 61 ug/dL (ref 60–130)

## 2022-04-10 LAB — VITAMIN E, SERUM
ALPHA-TOCOPHEROL: 10.4 mg/L (ref 5.7–19.9)
BETA-GAMMA TOCOPHEROL: 1.2 mg/L (ref <=4.3)

## 2022-04-10 LAB — VITAMIN A, SERUM: VITAMIN A (RETINOL): 31 ug/dL — ABNORMAL LOW (ref 38–98)

## 2022-04-11 LAB — VITAMIN B1 (THIAMIN), WHOLE BLOOD: VITAMIN B1 (THIAMINE), BLOOD, LC/MS/MS: 161 nmol/L (ref 78–185)

## 2022-04-11 LAB — VITAMIN K1, SERUM: VITAMIN K: 826 pg/mL (ref 130–1500)

## 2022-04-12 NOTE — Result Encounter Note (Signed)
I have reviewed your lab work. Results are normal. Please follow up as scheduled and call with any questions or concerns    Evette Cristal, FNP-C  04/12/2022, 09:24

## 2022-04-15 ENCOUNTER — Other Ambulatory Visit (HOSPITAL_COMMUNITY): Payer: Self-pay

## 2022-05-10 ENCOUNTER — Ambulatory Visit (INDEPENDENT_AMBULATORY_CARE_PROVIDER_SITE_OTHER): Payer: HMO | Admitting: Mammography

## 2022-05-29 ENCOUNTER — Ambulatory Visit (INDEPENDENT_AMBULATORY_CARE_PROVIDER_SITE_OTHER): Payer: HMO | Admitting: Mammography

## 2022-07-04 ENCOUNTER — Ambulatory Visit (INDEPENDENT_AMBULATORY_CARE_PROVIDER_SITE_OTHER): Payer: HMO | Admitting: Mammography

## 2022-07-19 ENCOUNTER — Ambulatory Visit (INDEPENDENT_AMBULATORY_CARE_PROVIDER_SITE_OTHER): Payer: HMO | Admitting: Mammography

## 2022-07-26 ENCOUNTER — Ambulatory Visit (INDEPENDENT_AMBULATORY_CARE_PROVIDER_SITE_OTHER): Payer: HMO | Admitting: Mammography

## 2022-07-26 ENCOUNTER — Other Ambulatory Visit: Payer: Self-pay

## 2022-07-26 ENCOUNTER — Encounter (INDEPENDENT_AMBULATORY_CARE_PROVIDER_SITE_OTHER): Payer: Self-pay | Admitting: Mammography

## 2022-07-26 DIAGNOSIS — Z1231 Encounter for screening mammogram for malignant neoplasm of breast: Secondary | ICD-10-CM

## 2022-07-26 DIAGNOSIS — Z0000 Encounter for general adult medical examination without abnormal findings: Secondary | ICD-10-CM

## 2022-08-01 ENCOUNTER — Other Ambulatory Visit (INDEPENDENT_AMBULATORY_CARE_PROVIDER_SITE_OTHER): Payer: Self-pay | Admitting: Family Medicine

## 2022-08-01 DIAGNOSIS — R928 Other abnormal and inconclusive findings on diagnostic imaging of breast: Secondary | ICD-10-CM

## 2022-08-02 ENCOUNTER — Telehealth (INDEPENDENT_AMBULATORY_CARE_PROVIDER_SITE_OTHER): Payer: Self-pay | Admitting: Family Medicine

## 2022-08-02 NOTE — Telephone Encounter (Addendum)
Called patient in regards to her mammogram results. I reach patient voicemail that was not accepting new messages. I will send a MyChart message but patient has an appointment scheduled for repeat mammogram on Left Side in May, 2024.    Earnest Rosier, Ambulatory Care Assistant    ----- Message from Victoriano Lain, MD sent at 08/01/2022  4:26 PM EST -----  Needs additional imaging

## 2022-10-16 ENCOUNTER — Other Ambulatory Visit (INDEPENDENT_AMBULATORY_CARE_PROVIDER_SITE_OTHER): Payer: Self-pay | Admitting: Family Medicine

## 2022-10-16 ENCOUNTER — Inpatient Hospital Stay (HOSPITAL_BASED_OUTPATIENT_CLINIC_OR_DEPARTMENT_OTHER)
Admission: RE | Admit: 2022-10-16 | Discharge: 2022-10-16 | Disposition: A | Discharge status: Home / Self Care | Payer: HMO | Source: Ambulatory Visit

## 2022-10-16 ENCOUNTER — Inpatient Hospital Stay
Admission: RE | Admit: 2022-10-16 | Discharge: 2022-10-16 | Disposition: A | Discharge status: Home / Self Care | Payer: HMO | Source: Ambulatory Visit | Attending: Family Medicine | Admitting: Family Medicine

## 2022-10-16 ENCOUNTER — Other Ambulatory Visit: Payer: Self-pay

## 2022-10-16 DIAGNOSIS — R928 Other abnormal and inconclusive findings on diagnostic imaging of breast: Secondary | ICD-10-CM

## 2022-11-12 ENCOUNTER — Other Ambulatory Visit (HOSPITAL_COMMUNITY): Payer: Self-pay

## 2022-11-13 ENCOUNTER — Other Ambulatory Visit (INDEPENDENT_AMBULATORY_CARE_PROVIDER_SITE_OTHER): Payer: HMO

## 2022-11-13 ENCOUNTER — Telehealth (HOSPITAL_COMMUNITY): Payer: Self-pay

## 2022-11-13 ENCOUNTER — Ambulatory Visit (INDEPENDENT_AMBULATORY_CARE_PROVIDER_SITE_OTHER): Payer: No Typology Code available for payment source

## 2022-11-13 ENCOUNTER — Other Ambulatory Visit: Payer: Self-pay

## 2022-11-13 ENCOUNTER — Other Ambulatory Visit (INDEPENDENT_AMBULATORY_CARE_PROVIDER_SITE_OTHER): Payer: Self-pay | Admitting: Family Medicine

## 2022-11-13 ENCOUNTER — Encounter (INDEPENDENT_AMBULATORY_CARE_PROVIDER_SITE_OTHER): Payer: Self-pay

## 2022-11-13 ENCOUNTER — Other Ambulatory Visit (INDEPENDENT_AMBULATORY_CARE_PROVIDER_SITE_OTHER): Payer: Self-pay | Admitting: Obstetrics & Gynecology

## 2022-11-13 ENCOUNTER — Ambulatory Visit: Payer: HMO | Attending: Obstetrics & Gynecology | Admitting: Obstetrics & Gynecology

## 2022-11-13 VITALS — BP 124/76 | HR 88 | Temp 98.9°F | Resp 20 | Ht 68.0 in | Wt 270.0 lb

## 2022-11-13 DIAGNOSIS — Z119 Encounter for screening for infectious and parasitic diseases, unspecified: Secondary | ICD-10-CM | POA: Insufficient documentation

## 2022-11-13 DIAGNOSIS — J029 Acute pharyngitis, unspecified: Secondary | ICD-10-CM

## 2022-11-13 DIAGNOSIS — R928 Other abnormal and inconclusive findings on diagnostic imaging of breast: Secondary | ICD-10-CM

## 2022-11-13 LAB — HIV1/HIV2 SCREEN, COMBINED ANTIGEN AND ANTIBODY: HIV SCREEN, COMBINED ANTIGEN & ANTIBODY: NEGATIVE

## 2022-11-13 LAB — SYPHILIS SCREENING ALGORITHM WITH REFLEX, SERUM: SYPHILIS TP ANTIBODIES: NONREACTIVE

## 2022-11-13 LAB — HEPATITIS C ANTIBODY SCREEN WITH REFLEX TO HCV PCR: HCV ANTIBODY QUALITATIVE: NEGATIVE

## 2022-11-13 LAB — HEPATITIS B SURFACE ANTIGEN: HBV SURFACE ANTIGEN QUALITATIVE: NEGATIVE

## 2022-11-13 LAB — POC STREP RAPID BY PCR (RESULTS): STREP, PCR, POC: NOT DETECTED

## 2022-11-13 NOTE — Progress Notes (Signed)
History of Present Illness: Laura Barton is a 47 y.o. female who presents to the Urgent Care today with chief complaint of    Chief Complaint              Sore Throat x1 month           Pt presents to [facility] with c/o sore throat x1 month. She reports she had an illness ~1 month ago and while all the other sxs have resolved, the sore throat has persisted. Pt denies fever, cough, SOB, and otalgia.     I reviewed and confirmed the patient's past medical history taken by the nurse or medical assistant with the addition of the following:    Past Medical History:    Past Medical History:   Diagnosis Date    ADHD (attention deficit hyperactivity disorder)     Anxiety     Autism spectrum disorder     Hip pain     Learning disability     Low back pain     Sleep apnea      Past Surgical History:    Past Surgical History:   Procedure Laterality Date    ANKLE SURGERY Right     2000    GALLBLADDER SURGERY  2007    removal    HX GASTRIC SLEEVE      August 2019     Allergies:  Allergies   Allergen Reactions    Latex Itching and Rash    Carbamazepine Rash    Hydrocodone-Acetaminophen Rash    Penicillins Swelling     Swelling in hands; states she still takes it as needed  Swelling in hands; states she still takes it as needed       Medications:    Current Outpatient Medications   Medication Sig    busPIRone (BUSPAR) 7.5 mg Oral Tablet Take 1 Tablet (7.5 mg total) by mouth Twice per day as needed (Patient not taking: Reported on 11/13/2022)    methylphenidate (RITALIN LA) 20 mg Oral Capsule, Multiphasic Rel.50-50 Biphasic Rel.50-50 Take 1 Capsule (20 mg total) by mouth Once a day (Patient not taking: Reported on 11/13/2022)    methylphenidate HCl (RITALIN) 10 mg Oral Tablet Take 1 Tablet (10 mg total) by mouth Once per day as needed (Patient not taking: Reported on 11/13/2022)     Social History:    Social History     Tobacco Use    Smoking status: Never     Passive exposure: Never    Smokeless tobacco: Never    Vaping Use    Vaping status: Never Used   Substance Use Topics    Alcohol use: Never    Drug use: Never     Family History:  Family Medical History:       Problem Relation (Age of Onset)    Cancer Maternal Grandfather    Depression Sister    Drug Abuse Sister, Brother    Healthy Mother, Maternal Uncle, Maternal Grandmother          Review of Systems:  General: no fever  ENT: sore throat, no otalgia  Pulmonary: no cough, no SOB  All other review of systems were negative    Physical Exam:  Vital signs:   Vitals:    11/13/22 1725   BP: 124/76   Pulse: 88   Resp: 20   Temp: 37.2 C (98.9 F)   TempSrc: Temporal   SpO2: 98%   Weight: 122 kg (270 lb)  Height: 1.727 m (5\' 8" )   BMI: 41.14     Body mass index is 41.05 kg/m. Facility age limit for growth %iles is 20 years.  Patient's last menstrual period was 10/30/2022.    General: WD/WN/WH and no acute distress  Head: Normocephalic and atraumatic   Eyes: Normal lids/lashes, normal conjunctiva, PERRLA and EOMI  ENT: Normal EAC's, normal TM's, MMM, normal pharynx/tonsils and normal tongue/uvula  Neck: Supple  Pulmonary: Clear to auscultation bilaterally, no wheezes, no rales and no rhonchi  Cardiovascular: Regular rate/rhythm, normal S1/S2 and no murmur/rub/gallop  Gastrointestinal: Non-distended, bowel sounds present, soft, non-tender, no masses and no visceromegaly  Extremities: FROM, no C/C/E and peripheral pulses intact  Skin: Warm/dry and no rash  Psychiatric: Appropriate affect and behavior and alert and oriented x 3  Neurologic: Cranial nerves intact, DTR equal and symmetrical bilaterally and no motor or sensory deficits   Hem/Lymph: No lymphadenopathy     Data Reviewed:    Results for orders placed or performed in visit on 11/13/22 (from the past 12 hour(s))   POC STREP RAPID BY PCR (RESULTS)   Result Value Ref Range    STREP, PCR, POC Not Detected Not Detected       Course: Condition at discharge: Good     Differential Diagnosis: URI vs strep vs allergic  rhinitis    Assessment:   Assessment/Plan   1. Sore throat      Plan:    Orders Placed This Encounter    PERFORM POC STREP RAPID BY PCR - CLINIC ONLY    POC STREP RAPID BY PCR (RESULTS)     POC Strep: Negative  Recommended ibuprofen and chloraseptic spray.   Advised follow up with ENT if sxs persist.   Follow up with PCP.    Go to Emergency Department immediately for further work up if any concerning symptoms.  Plan was discussed and patient verbalized understanding. If symptoms are worsening or not improving the patient should return to the Urgent Care for further evaluation.    I am scribing for, and in the presence of, Dr. Electa Sniff for services provided on 11/13/2022.  Ladona Ridgel Berisford, SCRIBE     Taylor Berisford, SCRIBE 11/13/2022, 18:12  I personally performed the services described in this documentation, as scribed  in my presence, and it is both accurate  and complete.    Lawernce Ion, DO

## 2022-11-13 NOTE — Telephone Encounter (Signed)
Attempted to contact patient to schedule breast biopsy. Left voice message with contact information for return call.

## 2022-11-13 NOTE — Nursing Note (Signed)
Patient has given verbal permission for the scribe to assist the healthcare provider during the patient's visit.Jake Michaelis, LPN 1/61/0960, 45:40

## 2022-11-13 NOTE — Nursing Note (Signed)
Zaelee is getting ID for her partner to do TDI, ordered by Dr.Rowan.   Spouse is Sheridyn Satkowski 01/24/82

## 2022-11-14 ENCOUNTER — Encounter (INDEPENDENT_AMBULATORY_CARE_PROVIDER_SITE_OTHER): Payer: Self-pay | Admitting: Family Medicine

## 2022-11-14 LAB — CHLAMYDIA TRACHOMATIS RNA, NAAT: CHLAMYDIA TRACHOMATIS RNA: NEGATIVE

## 2022-11-14 LAB — NEISSERIA GONORRHOEAE RNA, NAAT: NEISSERIA GONORRHEA GC RNA: NEGATIVE

## 2022-11-27 ENCOUNTER — Inpatient Hospital Stay (HOSPITAL_BASED_OUTPATIENT_CLINIC_OR_DEPARTMENT_OTHER)
Admission: RE | Admit: 2022-11-27 | Discharge: 2022-11-27 | Disposition: A | Discharge status: Home / Self Care | Payer: HMO | Source: Ambulatory Visit

## 2022-11-27 ENCOUNTER — Other Ambulatory Visit (INDEPENDENT_AMBULATORY_CARE_PROVIDER_SITE_OTHER): Payer: Self-pay | Admitting: Family Medicine

## 2022-11-27 ENCOUNTER — Inpatient Hospital Stay
Admission: RE | Admit: 2022-11-27 | Discharge: 2022-11-27 | Disposition: A | Discharge status: Home / Self Care | Payer: HMO | Source: Ambulatory Visit | Attending: Family Medicine

## 2022-11-27 ENCOUNTER — Other Ambulatory Visit: Payer: Self-pay

## 2022-11-27 DIAGNOSIS — N6042 Mammary duct ectasia of left breast: Secondary | ICD-10-CM | POA: Insufficient documentation

## 2022-11-27 DIAGNOSIS — N6325 Unspecified lump in the left breast, overlapping quadrants: Secondary | ICD-10-CM | POA: Insufficient documentation

## 2022-11-27 DIAGNOSIS — R928 Other abnormal and inconclusive findings on diagnostic imaging of breast: Secondary | ICD-10-CM

## 2022-11-27 DIAGNOSIS — N6082 Other benign mammary dysplasias of left breast: Secondary | ICD-10-CM | POA: Insufficient documentation

## 2022-11-27 MED ORDER — LIDOCAINE HCL 10 MG/ML (1 %) INJECTION SOLUTION
3.0000 mL | Freq: Once | INTRAMUSCULAR | Status: AC
Start: 2022-11-27 — End: 2022-11-27
  Administered 2022-11-27: 30 mg via SUBCUTANEOUS

## 2022-11-27 MED ORDER — LIDOCAINE 1 %-EPINEPHRINE 1:100,000 INJECTION SOLUTION
10.0000 mL | Freq: Once | INTRAMUSCULAR | Status: AC
Start: 2022-11-27 — End: 2022-11-27
  Administered 2022-11-27: 100 mg via INTRAMUSCULAR

## 2022-11-27 NOTE — Nurses Notes (Signed)
[facility]    [facility]  Breast Biopsy Pre/Post Assessment       Janeal Holmes  Date of Service: 11/27/2022      TIME OUT DOCUMENTATION:  Correct patient: Yes  Correct patient position: Yes  Correct procedure: Yes  Correct side and site are marked: Yes  Correct equipment: Yes  Consent signed: Arna Medici, MD     Technologist present for time out: Thereasa Distance   Physician present for time out: Cassell Smiles, MD  Fellow present for time out:  Stevenson Clinch  Resident present for time out: Resident not present  Nurse present for time out: Babette Relic  Physician present time: 1009  Time out: 1011  Procedure start time: 1012    Allergies   Allergen Reactions    Latex Itching and Rash    Carbamazepine Rash    Hydrocodone-Acetaminophen Rash    Penicillins Swelling     Swelling in hands; states she still takes it as needed  Swelling in hands; states she still takes it as needed         Patient Medications :Patient is not currently taking Aspirin, Plavix or Coumadin                        PROCEDURE:  Procedure:LUBX  Dressing Applied: Exofin  Patient Tolerated Procedure:Good                        POST PROCEDURE CARE:  Ace Wrap Applied:no  Ice Applied to Site:yes  Evidence of Bleeding:no  Condition at Discharge: Good  Written Discharge Instructions:yes  Follow Up With Pathology Report Breast Care Center:yes      Comments/notes: pt tolerated well    Wyn Forster, RN 11/27/2022, 09:52

## 2022-11-27 NOTE — Nurses Notes (Signed)
Minimally Invasive Breast Biopsy Care Instructions    You may take a shower today.Let the water run over the incision. Pat it dry. No bathing or soaking your breast in water until the glue flakes off.  If the incision comes open, place a dry band-aid on it. DO NOT apply ointments to the incision.  Check your incision frequently (every hour for the next four hours). If there would be bleeding present, apply firm pressure against the incision. If after 10 minutes of pressure, the area does not stop bleeding, go to the nearest E.R or Urgent Care Center.  Wear your bra continuously for the next 48 hours.  Keep ice on the incision until bedtime tonight. It is important that the ice pack does not touch your bare skin as it can cause frostbite.  You may have mild to moderate discomfort and a small to moderate amount of bruising. This is normal.  If you need medication for discomfort, take non-aspirin products (such as Tylenol, Motrin, Advil, or Ibuprofen), as directed by the manufacture for pain. DO NOT USE ASPIRIN for 48 hours.  Please avoid strenuous activities, such as, heavy cleaning, sweeping, vacuuming, yard work, tennis, golf, aerobics, skiing, weight lifting for 72 hours. You cannot lift anything with the affected side greater than 5 pounds for the next 72 hours.  Watch for signs of infection. Excessive bleeding, drainage, swelling, redness, heat, pain, or fever. If that should happen, please call [phone], or your physician. If it would be on a weekend please go to an Urgent Care Center or E.R.  For several days or even a couple weeks, you may have tenderness, "twinges", and a bump. This can be bothersome, but is not abnormal. Hot, moist packs using a wet washcloth heated in a microwave for 10 seconds and placed in a plastic bag may make this feel better. Do not use a heating pad. Usually this will disappear completely with a little time.  We will call you once we receive your pathology report.  Approximate  time is seven to fourteen days.  If you should have any questions or complications, please call the [facility] Care [facility] Nurse at [phone]

## 2022-11-28 ENCOUNTER — Ambulatory Visit (INDEPENDENT_AMBULATORY_CARE_PROVIDER_SITE_OTHER): Payer: HMO

## 2022-11-28 ENCOUNTER — Encounter (INDEPENDENT_AMBULATORY_CARE_PROVIDER_SITE_OTHER): Payer: Self-pay

## 2022-11-28 VITALS — BP 119/80 | HR 66 | Temp 98.2°F | Resp 16 | Ht 68.0 in | Wt 270.0 lb

## 2022-11-28 DIAGNOSIS — R21 Rash and other nonspecific skin eruption: Secondary | ICD-10-CM

## 2022-11-28 MED ORDER — NYSTATIN 100,000 UNIT/GRAM TOPICAL POWDER
Freq: Two times a day (BID) | CUTANEOUS | 1 refills | Status: AC
Start: 2022-11-28 — End: 2022-12-12

## 2022-11-28 MED ORDER — NYSTATIN 100,000 UNIT/GRAM TOPICAL CREAM
TOPICAL_CREAM | Freq: Three times a day (TID) | CUTANEOUS | 1 refills | Status: AC
Start: 2022-11-28 — End: 2022-12-12

## 2022-11-28 NOTE — Progress Notes (Signed)
Attending: Dr. Greggory Stallion  History of Present Illness: Laura Barton is a 47 y.o. female who presents to the Urgent Care today with chief complaint of    Chief Complaint              Rash Rash on chest. Patient had left breast biopsy done yesterday.           Pt presents to [facility] with c/o rash under R breast and abdominal fold x~11 days. She reports she first noticed the rash when they were on vacation in the [place] and she got "hot and sweaty". Pt sts she has been applying Gold bond powder but has not noticed any significant improvement. Reports similar rash under stomach. Denies drainage from rash. She denies fever, chills, vomiting, sore throat, and any other complaints.     I reviewed and confirmed the patient's past medical history taken by the nurse or medical assistant with the addition of the following:    Past Medical History:    Past Medical History:   Diagnosis Date    ADHD (attention deficit hyperactivity disorder)     Anxiety     Autism spectrum disorder     Hip pain     Learning disability     Low back pain     Sleep apnea      Past Surgical History:    Past Surgical History:   Procedure Laterality Date    ANKLE SURGERY Right     2000    GALLBLADDER SURGERY  2007    removal    HX GASTRIC SLEEVE      August 2019     Allergies:  Allergies   Allergen Reactions    Latex Itching and Rash    Carbamazepine Rash    Hydrocodone-Acetaminophen Rash    Penicillins Swelling     Swelling in hands; states she still takes it as needed  Swelling in hands; states she still takes it as needed       Medications:    Current Outpatient Medications   Medication Sig    busPIRone (BUSPAR) 7.5 mg Oral Tablet Take 1 Tablet (7.5 mg total) by mouth Twice per day as needed (Patient not taking: Reported on 11/13/2022)    methylphenidate (RITALIN LA) 20 mg Oral Capsule, Multiphasic Rel.50-50 Biphasic Rel.50-50 Take 1 Capsule (20 mg total) by mouth Once a day (Patient not taking: Reported on 11/13/2022)     methylphenidate HCl (RITALIN) 10 mg Oral Tablet Take 1 Tablet (10 mg total) by mouth Once per day as needed (Patient not taking: Reported on 11/13/2022)    nystatin (MYCOSTATIN) 100,000 unit/gram Cream Apply topically Three times a day for 14 days    nystatin (NYSTOP) 100,000 unit/gram Powder Apply topically Twice daily for 14 days     Social History:    Social History     Tobacco Use    Smoking status: Never     Passive exposure: Never    Smokeless tobacco: Never   Vaping Use    Vaping status: Never Used   Substance Use Topics    Alcohol use: Never    Drug use: Never     Family History:  Family Medical History:       Problem Relation (Age of Onset)    Cancer Maternal Grandfather    Depression Sister    Drug Abuse Sister, Brother    Healthy Mother, Maternal Uncle, Maternal Grandmother          Review of  Systems:  General: no fever, no chills  ENT: no sore throat  Gastrointestinal: no vomiting  Skin: rash under R breast and abdominal fold  All other review of systems were negative.    Physical Exam:  Vital signs:   Vitals:    11/28/22 1308   BP: 119/80   Pulse: 66   Resp: 16   Temp: 36.8 C (98.2 F)   TempSrc: Thermal Scan   SpO2: 96%   Weight: 122 kg (270 lb)   Height: 1.727 m (5\' 8" )   BMI: 41.14     Body mass index is 41.05 kg/m. Facility age limit for growth %iles is 20 years.  Patient's last menstrual period was 10/30/2022.    General: No acute distress  Head: Normocephalic  Eyes: Normal lids/lashes and normal conjunctiva  Pulmonary: Clear to auscultation bilaterally, no wheezes, no rales and no rhonchi  Cardiovascular: Regular rate/rhythm, normal S1/S2 and no murmur/rub/gallop  Skin: rash is difficult to evaluate d/t being covered in gold bond powder, raised and erythematous rash under R breast, similar rash seen under pannus, Warm/dry  Psychiatric: Appropriate affect and behavior  Neurologic:  Alert and oriented x 3    Data Reviewed:    Not Applicable    Course: Condition at discharge: Good    Differential  Diagnosis: eczema vs allergic dermatitis vs contact dermatitis vs yeast     Assessment:   Assessment/Plan   1. Rash      Plan:    Orders Placed This Encounter    nystatin (MYCOSTATIN) 100,000 unit/gram Cream    nystatin (NYSTOP) 100,000 unit/gram Powder     Pt was given printed rx for nystatin cream and powder.   Discussed supportive care.   Keep area clean and dry.   Follow up with PCP.     Go to Emergency Department immediately for further work up if any concerning symptoms.  Plan was discussed and patient verbalized understanding.     I am scribing for, and in the presence of, [employer], [place], for services provided on 11/28/2022.  Ladona Ridgel Berisford, SCRIBE   Taylor Berisford, SCRIBE 11/28/2022, 14:35    I personally performed the services described in this documentation, as scribed in my presence, and it is both accurate and complete.  The supervising/collaborating physician on site for this visit was Dr. Greggory Stallion.  [address] [place], PA-C 11/28/2022 14:35

## 2022-11-29 ENCOUNTER — Telehealth (HOSPITAL_COMMUNITY): Payer: Self-pay

## 2022-11-29 ENCOUNTER — Telehealth (INDEPENDENT_AMBULATORY_CARE_PROVIDER_SITE_OTHER): Payer: Self-pay | Admitting: Family Medicine

## 2022-11-29 DIAGNOSIS — N6082 Other benign mammary dysplasias of left breast: Secondary | ICD-10-CM

## 2022-11-29 DIAGNOSIS — N6042 Mammary duct ectasia of left breast: Secondary | ICD-10-CM

## 2022-11-29 LAB — SURGICAL PATHOLOGY SPECIMEN

## 2022-11-29 NOTE — Telephone Encounter (Signed)
Attempted to contact patient to review breast biopsy results. Left voice message with contact information for return call. Patient was notified of results per provider.

## 2022-11-29 NOTE — Telephone Encounter (Signed)
Reviewed breast biopsy results and recommendations with patient at 1247 on 11/29/2022. No issues or concerns with biopsy site

## 2022-11-29 NOTE — Telephone Encounter (Addendum)
Called patient in regards to their recent Breast Biopsy. I advised the patient of the results. Patient voiced understanding.    Dreama Saa, Ambulatory Care Assistant  11/29/2022, 11:00    ----- Message from Quintin Alto, MD sent at 11/29/2022 10:43 AM EDT -----  No cancer

## 2023-04-08 ENCOUNTER — Encounter (INDEPENDENT_AMBULATORY_CARE_PROVIDER_SITE_OTHER): Payer: Self-pay | Admitting: Family Medicine

## 2023-04-08 ENCOUNTER — Ambulatory Visit: Payer: HMO | Attending: Family Medicine | Admitting: Family Medicine

## 2023-04-08 ENCOUNTER — Ambulatory Visit (INDEPENDENT_AMBULATORY_CARE_PROVIDER_SITE_OTHER): Payer: HMO | Admitting: Family Medicine

## 2023-04-08 ENCOUNTER — Other Ambulatory Visit: Payer: Self-pay

## 2023-04-08 ENCOUNTER — Other Ambulatory Visit (INDEPENDENT_AMBULATORY_CARE_PROVIDER_SITE_OTHER): Payer: HMO

## 2023-04-08 VITALS — BP 138/78 | HR 55 | Temp 97.8°F | Resp 17 | Ht 68.0 in | Wt 285.0 lb

## 2023-04-08 DIAGNOSIS — Z0000 Encounter for general adult medical examination without abnormal findings: Secondary | ICD-10-CM | POA: Insufficient documentation

## 2023-04-08 DIAGNOSIS — Z23 Encounter for immunization: Secondary | ICD-10-CM

## 2023-04-08 DIAGNOSIS — G932 Benign intracranial hypertension: Secondary | ICD-10-CM

## 2023-04-08 DIAGNOSIS — F902 Attention-deficit hyperactivity disorder, combined type: Secondary | ICD-10-CM

## 2023-04-08 DIAGNOSIS — Z1211 Encounter for screening for malignant neoplasm of colon: Secondary | ICD-10-CM

## 2023-04-08 DIAGNOSIS — F320 Major depressive disorder, single episode, mild: Secondary | ICD-10-CM

## 2023-04-08 LAB — COMPREHENSIVE METABOLIC PANEL, NON-FASTING
ALBUMIN: 3.7 g/dL (ref 3.5–5.0)
ALKALINE PHOSPHATASE: 95 U/L (ref 40–110)
ALT (SGPT): 14 U/L (ref <31)
ANION GAP: 5 mmol/L (ref 4–13)
AST (SGOT): 22 U/L (ref 11–34)
BILIRUBIN TOTAL: 1.2 mg/dL (ref 0.3–1.3)
BUN/CREA RATIO: 15 (ref 6–22)
BUN: 10 mg/dL (ref 8–25)
CALCIUM: 9.2 mg/dL (ref 8.6–10.2)
CHLORIDE: 110 mmol/L (ref 96–111)
CO2 TOTAL: 23 mmol/L (ref 22–30)
CREATININE: 0.66 mg/dL (ref 0.60–1.05)
ESTIMATED GFR - FEMALE: >90 mL/min/BSA (ref >=60)
GLUCOSE: 86 mg/dL (ref 65–125)
POTASSIUM: 4.6 mmol/L (ref 3.7–5.3)
PROTEIN TOTAL: 7.1 g/dL (ref 6.0–7.9)
SODIUM: 138 mmol/L (ref 136–145)

## 2023-04-08 LAB — THYROID STIMULATING HORMONE (SENSITIVE TSH): TSH: 1.042 u[IU]/mL (ref 0.350–4.940)

## 2023-04-08 LAB — LIPID PANEL
CHOL/HDL RATIO: 3.2
CHOLESTEROL: 147 mg/dL (ref 100–200)
HDL CHOL: 46 mg/dL — ABNORMAL LOW (ref >=50)
LDL CALC: 77 mg/dL (ref <100)
NON-HDL: 101 mg/dL (ref <=190)
TRIGLYCERIDES: 137 mg/dL (ref <150)
VLDL CALC: 21 mg/dL (ref <30)

## 2023-04-08 MED ORDER — BUPROPION HCL XL 150 MG 24 HR TABLET, EXTENDED RELEASE
150.0000 mg | ORAL_TABLET | Freq: Every day | ORAL | 1 refills | Status: DC
Start: 2023-04-08 — End: 2023-06-13

## 2023-04-08 NOTE — Nursing Note (Signed)
1. Are you 46 years of age or older? yes  2. Have you ever had a severe reaction to a flu shot? no  3. Are you allergic to eggs? no  4. Are you allergic to latex? no  5. Are you allergic to Thimerosol? no  6. Are you experiencing acute illness symptoms or have you been running a fever? no  7. Do you have a medical condition or taking medications that suppress your immune system? no  8. Have you ever had Guillain-Barre syndrome or other neurologic disorder? no  9. Are you pregnant or breastfeeding? no    Immunization administered       Name Date Dose VIS Date Route    Covid-19 Vaccine,Pfizer(Comirnaty),88mcg/0.3ml,12 yr+,Seasonal 04/08/2023 0.3 mL 03/13/2023 Intramuscular    Site: Left deltoid    Given By: Dreama Saa, Ambulatory Care Assistant    Manufacturer: Pfizer-BioNTech    Lot: [id number]    NDC: 21308657846    HEPATITIS B VIRUS RECOMB VACCINE 04/08/2023 1 mL 10/05/2021 Intramuscular    Site: Left deltoid    Given By: Dreama Saa, Ambulatory Care Assistant    Manufacturer: GlaxoSmithKline    Lot: [id number]    NDC: 28413244010    Influenza Vaccine, 6 month-adult 04/08/2023 0.5 mL 12/31/2019 Intramuscular    Site: Left deltoid    Given By: Dreama Saa, Ambulatory Care Assistant    Manufacturer: GlaxoSmithKline    Lot: [id number]    NDC: [id number]            Dreama Saa, Ambulatory Care Assistant 04/08/2023, 11:07

## 2023-04-08 NOTE — Progress Notes (Signed)
[facility], [facility]  [address]  [place] [address]  Phone: [phone]  Fax: [phone]    Encounter Date:  04/08/23  Patient ID: Laura Barton   MRN: [id number]  DOB: 1976/02/26  Age: 47 y.o.      Subjective:     Chief Complaint:   Annual Exam (ADHD & anxiety)      Laura Barton is a 47 yo female presenting for annual exam.     HCM: Immunizations: getting flu, Hep B, and COVID; Pap smear: at [facility]; Mammogram: 07/26/22; Colon cancer screening: Cologuard ordered;  HIV/Hep C screening: negative; Diet: "Not too bad."; Exercise: "Very sedentary."        ADHD, anxiety: She has not been taking any of her medications. She felt like the constant shortages of Ritalin and trying to find it, were actually making her anxiety worse. She wants to go back on Concerta.     PHQ Questionnaire  Little interest or pleasure in doing things.: Not at all  Feeling down, depressed, or hopeless: Several Days  PHQ 2 Total: 1  Trouble falling or staying asleep, or sleeping too much.: Not at all  Feeling tired or having little energy: Several Days  Poor appetite or overeating: Several Days  Feeling bad about yourself/ that you are a failure in the past 2 weeks?: Not at all  Trouble concentrating on things in the past 2 weeks?: Nearly every day  Moving/Speaking slowly or being fidgety or restless  in the past 2 weeks?: Not at all  Thoughts that you would be better off DEAD, or of hurting yourself in some way.: Not at all  If you checked off any problems, how difficult have these problems made it for you to do your work, take care of things at home, or get along with other people?: Somewhat difficult  PHQ 9 Total: 6  Interpretation of Total Score: 5-9 Mild depression     GAD 7 Total Score  Gad-7 Score Total: 5  Interpretation: 5-9, mild anxiety     IIH: She reported having right sided eye pain again, which is similar to what she had with previous IIH. This  had resolved after she lost weight. She noticed joint pain as well, which  she attributes to weight gain.         Past Medical History:   Diagnosis Date    ADHD (attention deficit hyperactivity disorder)     Anxiety     Autism spectrum disorder     Depression     Hip pain     Learning disability     Low back pain     Sleep apnea            No current outpatient medications on file.       Allergies   Allergen Reactions    Latex Itching and Rash    Carbamazepine Rash    Hydrocodone-Acetaminophen Rash    Penicillins Swelling     Swelling in hands; states she still takes it as needed  Swelling in hands; states she still takes it as needed         Past Surgical History:   Procedure Laterality Date    ANKLE SURGERY Right     2000    GALLBLADDER SURGERY  2007    removal    HX GASTRIC SLEEVE      August 2019           Social History     Socioeconomic History  Marital status: Married   Tobacco Use    Smoking status: Never     Passive exposure: Never    Smokeless tobacco: Never   Vaping Use    Vaping status: Never Used   Substance and Sexual Activity    Alcohol use: Never    Drug use: Never    Sexual activity: Yes     Partners: Male, Female     Comment: spouse Transmasculine   Social History Narrative    No Hx of international travel        Family Medical History:       Problem Relation (Age of Onset)    Cancer Maternal Grandfather    Depression Sister    Drug Abuse Sister, Brother    Healthy Mother, Maternal Uncle, Maternal Grandmother              Review of Systems   Musculoskeletal:  Positive for joint pain.         Objective:   Vitals: BP 138/78   Pulse 55   Temp 36.6 C (97.8 F) (Temporal)   Resp 17   Ht 1.727 m (5\' 8" )   Wt 129 kg (285 lb)   SpO2 99%   BMI 43.33 kg/m     Physical Exam  Constitutional:       Appearance: She is obese.   Cardiovascular:      Rate and Rhythm: Normal rate and regular rhythm.   Pulmonary:      Effort: Pulmonary effort is normal. No respiratory distress.      Breath sounds: Normal breath sounds. No wheezing or rales.   Abdominal:      General: Bowel sounds  are normal. There is no distension.      Tenderness: There is no abdominal tenderness. There is no guarding.       Assessment & Plan:    Laura Barton is a 47 y.o. female presenting for annual exam.     (Z00.00) Annual physical exam  (primary encounter diagnosis)  Plan: HEP B VIRUS RECOMB VACCINE(ADMIN) ADULT, Flu         Vaccine, 6 month-adult,0.5 mL IM (Admin),         Cologuard colon cancer screening, LIPID PANEL,        [facility] GENERAL HEALTH PANEL (CMP,CBC/DIFF AND         TSH), CANCELED: Covid-19         Vaccine,Moderna(Spikevax),78mcg/0.5ml,35yrs+(S-        easonal)           Health Maintenance   Topic Date Due    Pap smear/HPV  Never done    Colonoscopy  Never done    Hepatitis B Vaccine (3 of 3 - 19+ 3-dose series) 06/03/2023    Depression Screening  04/07/2024    NonMedicare Preventative Exam  04/07/2024    Breast Cancer Screening  07/25/2024    Adult Tdap-Td (2 - Td or Tdap) 06/19/2026    Hepatitis C screening  Completed    HIV Screening  Completed    Influenza Vaccine  Completed    Covid-19 Vaccine  Completed        (Z23) Need for vaccination against hepatitis B virus  Plan: HEP B VIRUS RECOMB VACCINE(ADMIN) ADULT            (Z23) Need for immunization against influenza  Plan: Flu Vaccine, 6 month-adult,0.5 mL IM (Admin)            (Z12.11) Colon cancer screening  Plan: Cologuard colon  cancer screening            (F90.2) Attention deficit hyperactivity disorder (ADHD), combined type & (F32.0) Mild major depression (CMS HCC)  Plan: buPROPion (WELLBUTRIN XL) 150 mg extended         release 24 hr tablet        Will start Wellbutrin XL 150 mg qday.    She previously did well on Concerta; however, she was unable to get the medication due to shortage. She does not want to start on medications of which there could be a shortage. Discussed that most controlled substance stimulants seem to be having a similar issue.    Also advised if we restarted Concerta, that she would need to consistently keep her appointments,  or we would not longer write them.    Previous medication trial: Ritalin, Buspar, Concerta, Klonopin, Xanax, Lithium (misdiagnosed),      (G93.2) IIH (idiopathic intracranial hypertension)  Plan: Declines referral to cardiology. She states that she could not tolerate the medications.     The patient has been educated and verbalized understanding the services provided during this visit.     RTC in 6 weeks for ADHD & anxiety     Franklyn Lor, MD 04/08/2023, 11:06

## 2023-04-10 ENCOUNTER — Encounter (INDEPENDENT_AMBULATORY_CARE_PROVIDER_SITE_OTHER): Payer: Self-pay

## 2023-05-02 ENCOUNTER — Ambulatory Visit (INDEPENDENT_AMBULATORY_CARE_PROVIDER_SITE_OTHER): Payer: HMO | Admitting: Obstetrics & Gynecology

## 2023-05-02 DIAGNOSIS — R21 Rash and other nonspecific skin eruption: Secondary | ICD-10-CM

## 2023-05-02 MED ORDER — TRIAMCINOLONE ACETONIDE 0.1 % TOPICAL CREAM
TOPICAL_CREAM | Freq: Three times a day (TID) | CUTANEOUS | 0 refills | Status: DC
Start: 2023-05-02 — End: 2023-09-05

## 2023-05-02 NOTE — Progress Notes (Signed)
Attending: Greggory Stallion  Reason for E-Visit:   Visit Diagnosis with associated orders:    ICD-10-CM    1. Rash  R21             Mychart E-Visit Rash 1       Question 05/02/2023  8:37 AM EST - Filed by Patient    Have you had a fever sice the rash began? No    Do you feel very ill, lightheaded or short of breath? No    If we need to reach you today, what is the best method of contact?  Please indicate [facility] or list the best phone number for contact. [phone]    When did you first notice the rash? 1-3 days ago    Is this rash a result of a longstanding condition?   No    Is this rash something that you have had in the past and it has now come back? No    Is the rash spreading (involving more skin areas) since it first began? Yes    Please provide any other details you think are important about the location of the rash or how it is spreading. It started between my ring and pinky finger. It is very itchy. It has spread to my palm. It looks and feels like yeast i have had in the past but I want to make sure it's not related to Wellbutrin since i started taking it recently.    How is the rash distributed on your body? (Choose all that apply) Single skin spot or bump     Localized to one area of the body    Which part(s) of your body have the rash? (choose all that apply) Hands or fingers    What does the rash look like? (choose any that seem to apply) Small red or pink bumps    Does the rash itch? Very itchy    Is the rash painful or tender to touch? No    Have you recently been exposed to anything that could cause a rash? (indicate all that apply) (new meds, new lotion/perfume/soap, Poison ivy or oak, bug bites, bee stings, lice, someone w/ rash or chicken pox, sun or tanning bed, outdoor activities) New medications (including prescriptions, over-the-counter medications/herbs/supplements)    What treatments, lotions or medications have been tried for the rash (if any)? N/a    Have you traveled outside of the  [place] No    Please provide any other information that you think will be useful.       Please upload an image to help [place] provide better care for your issue.  The pictures you upload will be seen in your health care record. Please don't share private or sensitive photos.  [Media Unavailable] Upload from 05/02/23 0836 EST                 Orders Placed This Encounter    triamcinolone acetonide 0.1 % Cream           The patient (or their delegate) initiated request for e-visit using MyChart patient portal.  See MyChart messages documented in this encounter for details of online digital evaluation and management provided.    I personally spent 5 minutes of cumulative time performing this service (within 7 days since e-visit initiated).    Mackie Pai, APRN,NP-C  05/02/2023, 08:49

## 2023-05-02 NOTE — E-Visit Routing Comment (Signed)
E-visit received and forwarded to [employer], per patient request.  Blanche East, PATIENT NAVIGATOR,05/02/2023,08:48

## 2023-05-08 ENCOUNTER — Encounter (INDEPENDENT_AMBULATORY_CARE_PROVIDER_SITE_OTHER): Payer: Self-pay | Admitting: Family Medicine

## 2023-06-13 ENCOUNTER — Ambulatory Visit (INDEPENDENT_AMBULATORY_CARE_PROVIDER_SITE_OTHER): Payer: HMO | Admitting: Family Medicine

## 2023-06-13 ENCOUNTER — Encounter (INDEPENDENT_AMBULATORY_CARE_PROVIDER_SITE_OTHER): Payer: Self-pay | Admitting: Family Medicine

## 2023-06-13 ENCOUNTER — Other Ambulatory Visit: Payer: Self-pay

## 2023-06-13 VITALS — BP 135/84 | HR 74 | Temp 97.8°F | Resp 17 | Ht 67.0 in | Wt 278.0 lb

## 2023-06-13 DIAGNOSIS — F320 Major depressive disorder, single episode, mild: Secondary | ICD-10-CM

## 2023-06-13 DIAGNOSIS — F902 Attention-deficit hyperactivity disorder, combined type: Secondary | ICD-10-CM

## 2023-06-13 DIAGNOSIS — F419 Anxiety disorder, unspecified: Secondary | ICD-10-CM

## 2023-06-13 MED ORDER — BUSPIRONE 7.5 MG TABLET
7.5000 mg | ORAL_TABLET | Freq: Two times a day (BID) | ORAL | 1 refills | Status: DC | PRN
Start: 2023-06-13 — End: 2023-09-05

## 2023-06-13 MED ORDER — BUPROPION HCL XL 300 MG 24 HR TABLET, EXTENDED RELEASE
300.0000 mg | ORAL_TABLET | Freq: Every day | ORAL | 1 refills | Status: DC
Start: 2023-06-13 — End: 2023-08-18

## 2023-06-13 NOTE — Progress Notes (Signed)
[facility]  [address]  [place] [address]  Phone: [phone]  Fax: [phone]    Encounter Date:  06/13/23  Patient ID: Laura Barton   MRN: [id number]  DOB: [birth year]  Age: 48 y.o.      Subjective:     Chief Complaint:   Follow Up (ADHD & anxiety)      Laura Barton is a 48 yo female presenting for follow-up.     ADHD, anxiety: She is currently on Wellbutrin XL 150 mg qday. She feels like Wellbutrin helps with focusing, but it wears off. "I do alright if I start taking it at noon." "I think it would be good if it just lasted longer." She will crash around 3pm. She feels like anxiety has improved some. "My anxiety is particularly bad, so even better is still bad."     PHQ Questionnaire  Little interest or pleasure in doing things.: Not at all  Feeling down, depressed, or hopeless: Not at all  PHQ 2 Total: 0  Trouble falling or staying asleep, or sleeping too much.: Not at all  Feeling tired or having little energy: Nearly every day  Poor appetite or overeating: Several Days  Feeling bad about yourself/ that you are a failure in the past 2 weeks?: Several Days  Trouble concentrating on things in the past 2 weeks?: Nearly every day  Moving/Speaking slowly or being fidgety or restless  in the past 2 weeks?: Several Days  Thoughts that you would be better off DEAD, or of hurting yourself in some way.: Not at all  If you checked off any problems, how difficult have these problems made it for you to do your work, take care of things at home, or get along with other people?: Somewhat difficult  PHQ 9 Total: 9  Interpretation of Total Score: 5-9 Mild depression     GAD 7 Total Score  Gad-7 Score Total: 10  Interpretation: 10-14, moderate anxiety           Past Medical History:   Diagnosis Date    ADHD (attention deficit hyperactivity disorder)     Anxiety     Autism spectrum disorder     Depression     Hip pain     IIH (idiopathic intracranial hypertension)     Learning disability     Low  back pain     Sleep apnea            Current Outpatient Medications   Medication Sig    buPROPion (WELLBUTRIN XL) 150 mg extended release 24 hr tablet Take 1 Tablet (150 mg total) by mouth Once a day    triamcinolone acetonide 0.1 % Cream Apply topically Three times a day Apply a thin layer to affected area(s)       Allergies   Allergen Reactions    Latex Itching and Rash    Carbamazepine Rash    Hydrocodone-Acetaminophen Rash    Penicillins Swelling     Swelling in hands; states she still takes it as needed  Swelling in hands; states she still takes it as needed         Past Surgical History:   Procedure Laterality Date    ANKLE SURGERY Right     2000    GALLBLADDER SURGERY  2007    removal    HX GASTRIC SLEEVE      August 2019           Social History  Socioeconomic History    Marital status: Married   Tobacco Use    Smoking status: Never     Passive exposure: Never    Smokeless tobacco: Never   Vaping Use    Vaping status: Never Used   Substance and Sexual Activity    Alcohol use: Never    Drug use: Never    Sexual activity: Yes     Partners: Male, Female     Comment: spouse Transmasculine   Social History Narrative    No Hx of international travel        Family Medical History:       Problem Relation (Age of Onset)    Cancer Maternal Grandfather    Depression Sister    Drug Abuse Sister, Brother    Healthy Mother, Maternal Uncle, Maternal Grandmother              Review of Systems   Respiratory:  Negative for shortness of breath.    Cardiovascular:  Negative for chest pain.   Psychiatric/Behavioral:  The patient is nervous/anxious.         Trouble focusing          Objective:   Vitals: BP 135/84   Pulse 74   Temp 36.6 C (97.8 F) (Temporal)   Resp 17   Ht 1.702 m (5\' 7" )   Wt 126 kg (278 lb)   SpO2 98%   BMI 43.54 kg/m     Physical Exam  Constitutional:       Appearance: She is obese.   Cardiovascular:      Rate and Rhythm: Normal rate and regular rhythm.   Pulmonary:      Effort: Pulmonary effort is  normal. No respiratory distress.      Breath sounds: Normal breath sounds. No wheezing or rales.   Psychiatric:         Mood and Affect: Mood normal.           Assessment & Plan:    Laura Barton is a 48 y.o. female presenting for follow-up.     (F41.9) Anxiety, (F90.2) Attention deficit hyperactivity disorder (ADHD), combined type, & (F32.0) Mild major depression (CMS HCC)  (primary encounter diagnosis)  Plan: Will increase Wellbutrin XL to 300 mg qday. We could consider switching to SR, but she is uncertain if she would remember to take it.    For the anxiety, can do Buspar 7.5 mg BID PRN.   Previous medication trial: Ritalin, Concerta, Klonopin, Xanax, Lithium (misdiagnosed),        The patient has been educated and verbalized understanding the services provided during this visit.     RTC in 6 weeks for anxiety, depression, & ADHD    Franklyn Lor, MD 06/13/2023, 10:30

## 2023-06-18 ENCOUNTER — Encounter (INDEPENDENT_AMBULATORY_CARE_PROVIDER_SITE_OTHER): Payer: Self-pay | Admitting: Family Medicine

## 2023-07-31 ENCOUNTER — Encounter (INDEPENDENT_AMBULATORY_CARE_PROVIDER_SITE_OTHER): Payer: Self-pay | Admitting: Family Medicine

## 2023-08-18 ENCOUNTER — Encounter (INDEPENDENT_AMBULATORY_CARE_PROVIDER_SITE_OTHER): Payer: Self-pay | Admitting: Family Medicine

## 2023-08-18 ENCOUNTER — Other Ambulatory Visit (INDEPENDENT_AMBULATORY_CARE_PROVIDER_SITE_OTHER): Payer: Self-pay | Admitting: Family Medicine

## 2023-08-18 DIAGNOSIS — F902 Attention-deficit hyperactivity disorder, combined type: Secondary | ICD-10-CM

## 2023-08-18 DIAGNOSIS — F320 Major depressive disorder, single episode, mild: Secondary | ICD-10-CM

## 2023-08-18 DIAGNOSIS — F419 Anxiety disorder, unspecified: Secondary | ICD-10-CM

## 2023-08-18 MED ORDER — BUPROPION HCL XL 150 MG 24 HR TABLET, EXTENDED RELEASE
150.0000 mg | ORAL_TABLET | Freq: Every day | ORAL | 1 refills | Status: AC
Start: 2023-08-18 — End: ?

## 2023-09-05 ENCOUNTER — Other Ambulatory Visit: Payer: Self-pay

## 2023-09-05 ENCOUNTER — Ambulatory Visit (INDEPENDENT_AMBULATORY_CARE_PROVIDER_SITE_OTHER): Payer: Self-pay | Admitting: Family Medicine

## 2023-09-05 ENCOUNTER — Encounter (INDEPENDENT_AMBULATORY_CARE_PROVIDER_SITE_OTHER): Payer: Self-pay | Admitting: Family Medicine

## 2023-09-05 VITALS — BP 128/75 | HR 68 | Temp 98.1°F | Resp 17 | Ht 68.0 in | Wt 275.0 lb

## 2023-09-05 DIAGNOSIS — F419 Anxiety disorder, unspecified: Secondary | ICD-10-CM

## 2023-09-05 DIAGNOSIS — L309 Dermatitis, unspecified: Secondary | ICD-10-CM

## 2023-09-05 MED ORDER — TRIAMCINOLONE ACETONIDE 0.1 % TOPICAL CREAM
TOPICAL_CREAM | Freq: Three times a day (TID) | CUTANEOUS | 0 refills | Status: DC
Start: 2023-09-05 — End: 2024-01-13

## 2023-09-05 NOTE — Progress Notes (Signed)
 [facility]  [address]  [place] [address]  Phone: [phone]  Fax: [phone]    Encounter Date:  09/05/23  Patient ID: Laura Barton   MRN: [id number]  DOB: January 03, 1976  Age: 48 y.o.      Subjective:     Chief Complaint:   Follow Up (anxiety, depression, & ADHD)      Laura Barton is a 48 yo female presenting for follow-up.     Anxiety and depression: She is currently on Wellbutrin XL 150 mg qday and Buspar 7.5 mg BID PRN. She noticed having some issues with her intracranial hypertension. She had whoosing and pounding in her ears with position changes. She also got headaches and pain behind her eyes. She reported it resolved after going from 300 mg to 150 mg . She feels like her mood is doing better as the sun is coming out. She feels like the 150 wears off. "That is manageable." "My anxiety is always terrible. "I kind attribute that to how the world." She feels like Buspar helps, except it makes her tired.     PHQ Questionnaire  Little interest or pleasure in doing things.: Not at all  Feeling down, depressed, or hopeless: Not at all  PHQ 2 Total: 0  Trouble falling or staying asleep, or sleeping too much.: Not at all  Feeling tired or having little energy: Nearly every day  Poor appetite or overeating: Several Days  Feeling bad about yourself/ that you are a failure in the past 2 weeks?: Not at all  Trouble concentrating on things in the past 2 weeks?: Nearly every day  Moving/Speaking slowly or being fidgety or restless  in the past 2 weeks?: Not at all  Thoughts that you would be better off DEAD, or of hurting yourself in some way.: Not at all  If you checked off any problems, how difficult have these problems made it for you to do your work, take care of things at home, or get along with other people?: Somewhat difficult  PHQ 9 Total: 7  Interpretation of Total Score: 5-9 Mild depression     GAD 7 Total Score  Gad-7 Score Total: 10  Interpretation: 10-14, moderate anxiety            Past Medical History:   Diagnosis Date    ADHD (attention deficit hyperactivity disorder)     Anxiety     Autism spectrum disorder     Depression     Hip pain     IIH (idiopathic intracranial hypertension)     Learning disability     Low back pain     Sleep apnea            Current Outpatient Medications   Medication Sig    buPROPion (WELLBUTRIN XL) 150 mg extended release 24 hr tablet Take 1 Tablet (150 mg total) by mouth Daily    busPIRone (BUSPAR) 7.5 mg Oral Tablet Take 1 Tablet (7.5 mg total) by mouth Twice per day as needed    triamcinolone acetonide 0.1 % Cream Apply topically Three times a day Apply a thin layer to affected area(s)       Allergies   Allergen Reactions    Latex Itching and Rash    Carbamazepine Rash    Hydrocodone-Acetaminophen Rash    Penicillins Swelling     Swelling in hands; states she still takes it as needed  Swelling in hands; states she still takes it as needed  Past Surgical History:   Procedure Laterality Date    ANKLE SURGERY Right     2000    GALLBLADDER SURGERY  2007    removal    HX GASTRIC SLEEVE      August 2019           Social History     Socioeconomic History    Marital status: Married   Tobacco Use    Smoking status: Never     Passive exposure: Never    Smokeless tobacco: Never   Vaping Use    Vaping status: Never Used   Substance and Sexual Activity    Alcohol use: Never    Drug use: Never    Sexual activity: Yes     Partners: Male, Female     Comment: spouse Transmasculine   Social History Narrative    No Hx of international travel        Family Medical History:       Problem Relation (Age of Onset)    Cancer Maternal Grandfather    Depression Sister    Drug Abuse Sister, Brother    Healthy Mother, Maternal Uncle, Maternal Grandmother              ROS      Objective:   Vitals: BP 128/75   Pulse 68   Temp 36.7 C (98.1 F) (Temporal)   Resp 17   Ht 1.727 m (5\' 8" )   Wt 125 kg (275 lb)   SpO2 96%   BMI 41.81 kg/m     Physical Exam  Cardiovascular:       Rate and Rhythm: Normal rate and regular rhythm.   Pulmonary:      Effort: Pulmonary effort is normal. No respiratory distress.      Breath sounds: Normal breath sounds. No wheezing or rales.   Skin:     Findings: Rash present.           Assessment & Plan:    Laura Barton is a 48 y.o. female presenting for follow-up.     (F41.9) Anxiety  (primary encounter diagnosis)  Plan: Will try doing Buspar 7.5 mg BID consistently once school is out. She will start with 1/2 tablet. Continue Wellbutrin XL 150 mg qday (highest tolerated dose).    Could consider Cymbalta.    Previous medication trials: Ritalin, Concerta, Klonopin, Xanax, Lithium (misdiagnosed), Lamictal, Abilify, Depakote, Prozac, & Zoloft    (L30.9) Dermatitis  Plan: Consistent with contact dermatitis, so will try Triamcinolone cream.       The patient has been educated and verbalized understanding the services provided during this visit.     RTC in 3 months for anxiety & dermatitis     Laura Shanti Eichel, MD 09/05/2023, 13:16

## 2023-09-20 ENCOUNTER — Ambulatory Visit (INDEPENDENT_AMBULATORY_CARE_PROVIDER_SITE_OTHER): Admitting: PHYSICIAN ASSISTANT

## 2023-09-20 ENCOUNTER — Ambulatory Visit (INDEPENDENT_AMBULATORY_CARE_PROVIDER_SITE_OTHER): Payer: Self-pay | Admitting: PHYSICIAN ASSISTANT

## 2023-09-20 ENCOUNTER — Other Ambulatory Visit: Payer: Self-pay

## 2023-09-20 ENCOUNTER — Encounter (INDEPENDENT_AMBULATORY_CARE_PROVIDER_SITE_OTHER): Payer: Self-pay

## 2023-09-20 ENCOUNTER — Other Ambulatory Visit (INDEPENDENT_AMBULATORY_CARE_PROVIDER_SITE_OTHER): Admitting: Radiology

## 2023-09-20 VITALS — BP 136/82 | HR 74 | Temp 97.8°F | Resp 20 | Ht 68.0 in | Wt 275.0 lb

## 2023-09-20 DIAGNOSIS — J329 Chronic sinusitis, unspecified: Secondary | ICD-10-CM

## 2023-09-20 DIAGNOSIS — R051 Acute cough: Secondary | ICD-10-CM

## 2023-09-20 DIAGNOSIS — H6990 Unspecified Eustachian tube disorder, unspecified ear: Secondary | ICD-10-CM

## 2023-09-20 DIAGNOSIS — H6692 Otitis media, unspecified, left ear: Secondary | ICD-10-CM

## 2023-09-20 MED ORDER — PROMETHAZINE-DM 6.25 MG-15 MG/5 ML ORAL SYRUP
5.0000 mL | ORAL_SOLUTION | Freq: Four times a day (QID) | ORAL | 0 refills | Status: AC | PRN
Start: 2023-09-20 — End: 2023-10-04

## 2023-09-20 MED ORDER — DOXYCYCLINE HYCLATE 100 MG CAPSULE
100.0000 mg | ORAL_CAPSULE | Freq: Two times a day (BID) | ORAL | 0 refills | Status: AC
Start: 2023-09-20 — End: 2023-09-30

## 2023-09-20 MED ORDER — FLUCONAZOLE 150 MG TABLET
ORAL_TABLET | ORAL | 0 refills | Status: DC
Start: 2023-09-20 — End: 2024-01-13

## 2023-09-20 MED ORDER — METHYLPREDNISOLONE 4 MG TABLETS IN A DOSE PACK
ORAL_TABLET | ORAL | 0 refills | Status: DC
Start: 2023-09-20 — End: 2024-01-13

## 2023-09-20 NOTE — Progress Notes (Signed)
 Attending: Dr. Coleta David  History of Present Illness: Laura Barton is a 48 y.o. female who presents to the Urgent Care today with chief complaint of    Chief Complaint              Cough x15 days     Sinus Pressure     Ear Pain L           Pt presents to [facility] with c/o cough productive of yellow sputum x~15 days. She associates nasal congestion and new onset of sinus pain and pressure ~1 week ago and L ear fullness and pain and lightheadedness last night. Pt sts she had a cold-like illness at onset and her sxs improved overall, but the cough and congestion has continued to persist. Pt sts she has taken Tylenol OTC for her sxs. She reports her children had similar sxs, but their sxs have resolved. Pt denies fever, chills, sore throat, SOB, nausea, vomiting, and diarrhea.     I reviewed and confirmed the patient's past medical history taken by the nurse or medical assistant with the addition of the following:    Past Medical History:    Past Medical History:   Diagnosis Date    ADHD (attention deficit hyperactivity disorder)     Anxiety     Autism spectrum disorder     Depression     Hip pain     IIH (idiopathic intracranial hypertension)     Learning disability     Low back pain     Sleep apnea      Past Surgical History:    Past Surgical History:   Procedure Laterality Date    ANKLE SURGERY Right     2000    GALLBLADDER SURGERY  2007    removal    HX GASTRIC SLEEVE      August 2019     Allergies:  Allergies   Allergen Reactions    Latex Itching and Rash    Carbamazepine Rash    Hydrocodone-Acetaminophen Rash    Penicillins Swelling     Swelling in hands; states she still takes it as needed  Swelling in hands; states she still takes it as needed       Medications:    Current Outpatient Medications   Medication Sig    buPROPion  (WELLBUTRIN  XL) 150 mg extended release 24 hr tablet Take 1 Tablet (150 mg total) by mouth Daily    busPIRone  (BUSPAR ) 7.5 mg Oral Tablet Take 1 Tablet (7.5 mg total) by  mouth Twice daily    doxycycline  hyclate (VIBRAMYCIN ) 100 mg Oral Capsule Take 1 Capsule (100 mg total) by mouth Twice daily for 10 days    fluconazole  (DIFLUCAN ) 150 mg Oral Tablet Take one tablet ASAP (at onset of symptoms), repeat in 2 days    Methylprednisolone  (MEDROL  DOSEPACK) 4 mg Oral Tablets, Dose Pack Take as instructed.    promethazine -dextromethorphan (PHENERGAN -DM) 6.25-15 mg/5 mL Oral Syrup Take 5 mL by mouth Four times a day as needed for Cough for up to 14 days    triamcinolone  acetonide 0.1 % Cream Apply topically Three times a day Apply a thin layer to affected area(s)     Social History:    Social History     Tobacco Use    Smoking status: Never     Passive exposure: Never    Smokeless tobacco: Never   Vaping Use    Vaping status: Never Used   Substance Use Topics  Alcohol use: Never    Drug use: Never     Family History:  Family Medical History:       Problem Relation (Age of Onset)    Cancer Maternal Grandfather    Depression Sister    Drug Abuse Sister, Brother    Healthy Mother, Maternal Uncle, Maternal Grandmother          Review of Systems:  General: no fever, no chills  ENT: nasal congestion, sinus pain and pressure, L ear fullness and pain, no sore throat  Pulmonary: productive cough (yellow sputum), no SOB  Gastrointestinal: no nausea, no vomiting, no diarrhea  Neurologic: lightheadedness  All other review of systems were negative.    Physical Exam:  Vital signs:   Vitals:    09/20/23 0807 09/20/23 0811   BP: (!) 145/90 136/82   Pulse: 74    Resp: 20    Temp: 36.6 C (97.8 F)    TempSrc: Temporal    SpO2: 96%    Weight: 125 kg (275 lb)    Height: 1.727 m (5\' 8" )    BMI: 41.81      Body mass index is 41.81 kg/m. Facility age limit for growth %iles is 20 years.  Patient's last menstrual period was 08/30/2023 (approximate).    General: No acute distress  Head: Normocephalic  Eyes: Normal lids/lashes and normal conjunctiva  ENT: L TM erythematous and bulging with purulent material behind  the TM, Mild pharyngeal erythema, maxillary sinus TTP, R TM normal, Normal EAC's, MMM, normal tonsils and normal tongue/uvula  Neck: Supple  Pulmonary: Clear to auscultation bilaterally, no wheezes, no rales and no rhonchi  Cardiovascular: Regular rate/rhythm, normal S1/S2 and no murmur/rub/gallop  Skin: Warm/dry and no rash  Psychiatric: Appropriate affect and behavior  Neurologic:  Alert and oriented x 3  Hem/Lymph: No cervical lymphadenopathy     Data Reviewed:    XR CHEST PA AND LATERAL performed on 09/20/2023 8:29 AM.    REASON FOR EXAM:  H66.92: Left otitis media, unspecified otitis media type    TECHNIQUE: 2 views/3 images submitted for interpretation.    COMPARISON:  None    FINDINGS:  No focal consolidation, pneumothorax or pleural effusion. The cardiomediastinal silhouette is normal in size.    Impression  No acute cardiopulmonary process.    Course: Condition at discharge: Good    Differential Diagnosis: sinusitis vs bronchitis vs OM vs ETD    Assessment:   Assessment/Plan   1. Acute cough    2. Left otitis media, unspecified otitis media type    3. Dysfunction of Eustachian tube, unspecified laterality    4. Sinusitis, unspecified chronicity, unspecified location      Plan:    Orders Placed This Encounter    CXR PA & Lateral    doxycycline  hyclate (VIBRAMYCIN ) 100 mg Oral Capsule    promethazine -dextromethorphan (PHENERGAN -DM) 6.25-15 mg/5 mL Oral Syrup    Methylprednisolone  (MEDROL  DOSEPACK) 4 mg Oral Tablets, Dose Pack    fluconazole  (DIFLUCAN ) 150 mg Oral Tablet     CXR performed in clinic, see data reviewed.   Educated on eustachian tube dysfunction.   Rx for doxycycline , Phenergan  DM, and Medrol  Dosepack sent to the pharmacy, educated on proper use. Pt requests Diflucan  ash she is being prescribed antibiotics, rx sent. Advised to take one dose of Diflucan  at onset of symptoms of a yeast infection and repeat in 2 days if sxs persist.   Discussed supportive care with OTC medications/remedies, i.e  Zyrtec, Flonase, and  Mucinex.   Follow up prn for worsening/persisting sxs.     Go to Emergency Department immediately for further work up if any concerning symptoms.  Plan was discussed and patient verbalized understanding.     I am scribing for, and in the presence of, [employer], PA-C, for services provided on 09/20/2023.  Carolynne Citron Berisford, SCRIBE   [employer], SCRIBE 09/20/2023, 09:47    I personally performed the services described in this documentation, as scribed in my presence, and it is both accurate and complete.  The supervising/collaborating physician on site for this visit was Dr. Coleta David.  [address], [place], PA-C 09/20/2023 09:49

## 2023-10-18 LAB — COLOGUARD® COLON CANCER SCREEN: COLOGUARD RESULT: NEGATIVE

## 2023-10-21 ENCOUNTER — Ambulatory Visit (INDEPENDENT_AMBULATORY_CARE_PROVIDER_SITE_OTHER): Payer: Self-pay | Admitting: Family Medicine

## 2023-10-21 ENCOUNTER — Encounter (INDEPENDENT_AMBULATORY_CARE_PROVIDER_SITE_OTHER): Payer: Self-pay

## 2023-12-19 ENCOUNTER — Encounter (INDEPENDENT_AMBULATORY_CARE_PROVIDER_SITE_OTHER): Payer: Self-pay | Admitting: Family Medicine

## 2023-12-24 ENCOUNTER — Encounter (INDEPENDENT_AMBULATORY_CARE_PROVIDER_SITE_OTHER): Admitting: Family Medicine

## 2024-01-09 ENCOUNTER — Encounter (INDEPENDENT_AMBULATORY_CARE_PROVIDER_SITE_OTHER): Admitting: Family Medicine

## 2024-01-13 ENCOUNTER — Other Ambulatory Visit: Payer: Self-pay

## 2024-01-13 ENCOUNTER — Ambulatory Visit (INDEPENDENT_AMBULATORY_CARE_PROVIDER_SITE_OTHER): Admitting: Family Medicine

## 2024-01-13 ENCOUNTER — Encounter (INDEPENDENT_AMBULATORY_CARE_PROVIDER_SITE_OTHER): Payer: Self-pay | Admitting: Family Medicine

## 2024-01-13 VITALS — BP 132/70 | HR 57 | Temp 98.6°F | Resp 16 | Ht 68.0 in | Wt 293.9 lb

## 2024-01-13 DIAGNOSIS — F902 Attention-deficit hyperactivity disorder, combined type: Secondary | ICD-10-CM

## 2024-01-13 DIAGNOSIS — K0889 Other specified disorders of teeth and supporting structures: Secondary | ICD-10-CM

## 2024-01-13 DIAGNOSIS — F39 Unspecified mood [affective] disorder: Secondary | ICD-10-CM

## 2024-01-13 NOTE — Progress Notes (Signed)
 [facility]  [address]  [place] [address]  Phone: [phone]  Fax: [phone]    Encounter Date:  01/13/24  Patient ID: Laura Barton   MRN: [id number]  DOB: 07/16/1975  Age: 48 y.o.      Subjective:     Chief Complaint:   Follow Up (anxiety & dermatitis)      Laura Barton is a 48 yo female presenting for follow-up.     Anxiety, depression, & ADHD: She is currently on Wellbutrin  XL 150 mg qday and Buspar  7.5 mg BID PRN. She feels like mood is going well. She is only using Buspar  a few times a month. She feels like the lower dose of Wellbutrin  is better.     PHQ Questionnaire  Little interest or pleasure in doing things.: Not at all  Feeling down, depressed, or hopeless: Not at all  PHQ 2 Total: 0  Trouble falling or staying asleep, or sleeping too much.: Not at all  Feeling tired or having little energy: Nearly every day  Poor appetite or overeating: Several Days  Feeling bad about yourself/ that you are a failure in the past 2 weeks?: Not at all  Trouble concentrating on things in the past 2 weeks?: Several Days  Moving/Speaking slowly or being fidgety or restless  in the past 2 weeks?: Not at all  Thoughts that you would be better off DEAD, or of hurting yourself in some way.: Not at all  If you checked off any problems, how difficult have these problems made it for you to do your work, take care of things at home, or get along with other people?: Not difficult at all  PHQ 9 Total: 5  Interpretation of Total Score: 5-9 Mild depression     GAD 7 Total Score  Gad-7 Score Total: 8  Interpretation: 5-9, mild anxiety     Dermatitis: She has not needed Triamcinolone .     Tooth: She reports chipping her left lower wisdom tooth around Christmas. She has pain whenever she is eating on that side. She has been wearing her CPAP less because the way the air hits it irritates the tooth. She has increasing fatigue from not being able to wear the CPAP. She is waiting for her dental plan to kick in.  She denies fever. She feels like she may have chipped it more last week because she noticed more pain.         Past Medical History:   Diagnosis Date    ADHD (attention deficit hyperactivity disorder)     Anxiety     Autism spectrum disorder     Depression     Hip pain     IIH (idiopathic intracranial hypertension)     Learning disability     Low back pain     Sleep apnea            Current Outpatient Medications   Medication Sig    buPROPion  (WELLBUTRIN  XL) 150 mg extended release 24 hr tablet Take 1 Tablet (150 mg total) by mouth Daily    busPIRone  (BUSPAR ) 7.5 mg Oral Tablet Take 1 Tablet (7.5 mg total) by mouth Twice daily    fluconazole  (DIFLUCAN ) 150 mg Oral Tablet Take one tablet ASAP (at onset of symptoms), repeat in 2 days    Methylprednisolone  (MEDROL  DOSEPACK) 4 mg Oral Tablets, Dose Pack Take as instructed.    triamcinolone  acetonide 0.1 % Cream Apply topically Three times a day Apply a thin layer to  affected area(s)       Allergies[1]    Past Surgical History:   Procedure Laterality Date    ANKLE SURGERY Right     2000    GALLBLADDER SURGERY  2007    removal    HX GASTRIC SLEEVE      August 2019           Social History     Socioeconomic History    Marital status: Married   Tobacco Use    Smoking status: Never     Passive exposure: Never    Smokeless tobacco: Never   Vaping Use    Vaping status: Never Used   Substance and Sexual Activity    Alcohol use: Never    Drug use: Never    Sexual activity: Yes     Partners: Male, Female     Comment: spouse Transmasculine   Social History Narrative    No Hx of international travel        Family Medical History:       Problem Relation (Age of Onset)    Cancer Maternal Grandfather    Depression Sister    Drug Abuse Sister, Brother    Healthy Mother, Maternal Uncle, Maternal Grandmother              Review of Systems   Respiratory:  Negative for shortness of breath.    Cardiovascular:  Negative for chest pain.   Neurological:  Negative for dizziness and headaches.          Objective:   Vitals: BP 132/70   Pulse 57   Temp 37 C (98.6 F) (Temporal)   Resp 16   Ht 1.727 m (5' 8)   Wt 133 kg (293 lb 14.4 oz)   SpO2 99%   BMI 44.69 kg/m     Physical Exam  Constitutional:       Appearance: She is obese.   HENT:      Mouth/Throat:      Comments: Left lower wisdom tooth: Cracked. No signs of surrounding erythema or abscess. Possible pulp exposure  Cardiovascular:      Rate and Rhythm: Normal rate and regular rhythm.   Pulmonary:      Effort: Pulmonary effort is normal. No respiratory distress.      Breath sounds: Normal breath sounds. No wheezing or rales.           Assessment & Plan:    Laura Barton is a 48 y.o. female presenting for follow-up.     (F39) Mood disorder (CMS HCC)  & (F90.2) Attention deficit hyperactivity disorder (ADHD), combined type (primary encounter diagnosis)  Plan: Continue Wellbutrin  XL 150 mg qday (highest tolerated dose) and Buspar  7.5 mg BID PRN.    Previous medication trials: Ritalin , Concerta , Klonopin, Xanax, Lithium (misdiagnosed), Lamictal, Abilify, Depakote, Prozac, & Zoloft     (K08.89) Tooth pain  Plan: Advised to see dentist. No indication of infection at this time, so no indication for antibiotics. Pulp exposure may explain the pain.    The patient has been educated and verbalized understanding the services provided during this visit.     RTC in 3 months for anxiety, depression, & ADHD    KATIE Samyuktha Brau, MD 01/13/2024, 08:33              [1]   Allergies  Allergen Reactions    Latex Itching and Rash    Carbamazepine Rash    Hydrocodone-Acetaminophen Rash    Penicillins Swelling  Swelling in hands; states she still takes it as needed  Swelling in hands; states she still takes it as needed

## 2024-04-04 ENCOUNTER — Other Ambulatory Visit: Payer: Self-pay

## 2024-04-04 ENCOUNTER — Ambulatory Visit (INDEPENDENT_AMBULATORY_CARE_PROVIDER_SITE_OTHER): Payer: Self-pay | Admitting: PHYSICIAN ASSISTANT

## 2024-04-04 ENCOUNTER — Ambulatory Visit (INDEPENDENT_AMBULATORY_CARE_PROVIDER_SITE_OTHER): Admitting: PHYSICIAN ASSISTANT

## 2024-04-04 ENCOUNTER — Encounter (INDEPENDENT_AMBULATORY_CARE_PROVIDER_SITE_OTHER): Payer: Self-pay

## 2024-04-04 VITALS — BP 158/98 | HR 72 | Temp 100.3°F | Resp 20 | Wt 290.7 lb

## 2024-04-04 DIAGNOSIS — J020 Streptococcal pharyngitis: Secondary | ICD-10-CM

## 2024-04-04 DIAGNOSIS — J029 Acute pharyngitis, unspecified: Secondary | ICD-10-CM

## 2024-04-04 LAB — POC STREP RAPID BY PCR (RESULTS): STREP, PCR, POC: DETECTED — AB

## 2024-04-04 MED ORDER — CEPHALEXIN 500 MG CAPSULE
500.0000 mg | ORAL_CAPSULE | Freq: Two times a day (BID) | ORAL | 0 refills | Status: DC
Start: 2024-04-04 — End: 2024-04-15

## 2024-04-04 NOTE — Progress Notes (Signed)
 Attending: Dr. Ruthann  History of Present Illness: Laura Barton is a 48 y.o. female who presents to the Urgent Care today with chief complaint of    Chief Complaint              Sore Throat Children have strep    Generalized Body Aches     Chills           Pt presents to [facility] with c/o sore throat x 4 days. She associates wet cough, congestion,  and new onset of fever (TMAX 101 degrees F), body aches, and chills yesterday. Pt reports her child tested positive for strep 2 days ago. She sts she has taken Tylenol for her sxs (last dose yesterday). Pt denies nausea, vomiting, and diarrhea.   Pt has listed allergy to penicillins. She sts she has taken amoxicillin since then and has tolerated well.     I reviewed and confirmed the patient's past medical history taken by the nurse or medical assistant with the addition of the following:    Past Medical History:    Past Medical History:   Diagnosis Date    ADHD (attention deficit hyperactivity disorder)     Anxiety     Autism spectrum disorder     Depression     Hip pain     IIH (idiopathic intracranial hypertension)     Learning disability     Low back pain     Sleep apnea      Past Surgical History:    Past Surgical History:   Procedure Laterality Date    ANKLE SURGERY Right     2000    GALLBLADDER SURGERY  2007    removal    HX GASTRIC SLEEVE      August 2019     Allergies:  Allergies[1]    Medications:    Current Outpatient Medications   Medication Sig    buPROPion  (WELLBUTRIN  XL) 150 mg extended release 24 hr tablet Take 1 Tablet (150 mg total) by mouth Daily    busPIRone  (BUSPAR ) 7.5 mg Oral Tablet Take 1 Tablet (7.5 mg total) by mouth Twice per day as needed    cephalexin (KEFLEX) 500 mg Oral Capsule Take 1 Capsule (500 mg total) by mouth Twice daily for 10 days     Social History:    Social History[2]    Family History:  Family Medical History:       Problem Relation (Age of Onset)    Cancer Maternal Grandfather    Depression Sister    Drug  Abuse Sister, Brother    Healthy Mother, Maternal Uncle, Maternal Grandmother          Review of Systems:  General: fever, chills, body aches  ENT: sore throat, congestion  Pulmonary: wet cough  Gastrointestinal: no nausea, no vomiting, no diarrhea  All other review of systems were negative.    Physical Exam:  Vital signs:   Vitals:    04/04/24 1027   BP: (!) 158/98   Pulse: 72   Resp: 20   Temp: 37.9 C (100.3 F)   SpO2: 99%   Weight: 132 kg (290 lb 11.2 oz)     General: No acute distress  Head: Normocephalic  Eyes: Normal lids/lashes and normal conjunctiva  ENT: posterior pharynx erythematous, no exudates, Normal EAC's, normal TM's, MMM, normal tonsils and normal tongue/uvula  Pulmonary: Clear to auscultation bilaterally, no wheezes, no rales and no rhonchi  Cardiovascular: Regular rate/rhythm, normal S1/S2 and  no murmur/rub/gallop  Skin: Warm/dry and no rash  Psychiatric: Appropriate affect and behavior  Neurologic:  Alert and oriented x 3  Hem/Lymph: No cervical lymphadenopathy     Data Reviewed:    Results for orders placed or performed in visit on 04/04/24 (from the past 12 hours)   POC STREP RAPID BY PCR (RESULTS)   Result Value Ref Range    STREP, PCR, POC Detected (A) Not Detected     Course: Condition at discharge: Good    Differential Diagnosis: URI vs strep vs allergic rhinitis    Assessment:   Assessment/Plan   1. Sore throat    2. Strep pharyngitis      Plan:    Orders Placed This Encounter    PERFORM POC STREP RAPID BY PCR - CLINIC ONLY    POC STREP RAPID BY PCR (RESULTS)    cephalexin (KEFLEX) 500 mg Oral Capsule     POC Strep: Positive  Pt declined viral testing.     Rx for Keflex sent to the pharmacy, educated on proper use.   Recommended supportive care with rest, hydration, and OTC medications as needed for symptomatic relief.  Follow up with PCP.     Go to Emergency Department immediately for further work up if any concerning symptoms.  Plan was discussed and patient verbalized understanding.      I am scribing for, and in the presence of, [employer], PA-C, for services provided on 04/04/2024.  Waddell Berisford, SCRIBE   Taylor Berisford, SCRIBE 04/04/2024, 20:13    I personally performed the services described in this documentation, as scribed in my presence, and it is both accurate and complete.  The supervising/collaborating physician on site for this visit was Dr. Ruthann.  [address], MSPAS, PA-C 04/04/2024 20:15       [1]   Allergies  Allergen Reactions    Latex Itching and Rash    Carbamazepine Rash    Hydrocodone-Acetaminophen Rash    Penicillins Swelling     Swelling in hands; states she still takes it as needed  Swelling in hands; states she still takes it as needed     [2]   Social History  Tobacco Use    Smoking status: Never     Passive exposure: Never    Smokeless tobacco: Never   Vaping Use    Vaping status: Never Used   Substance Use Topics    Alcohol use: Never    Drug use: Never

## 2024-04-04 NOTE — Nursing Note (Signed)
 Patient has given verbal permission for the scribe to assist the healthcare provider during the patient's visit.Darryle Moose, RN 04/04/2024, 10:28

## 2024-04-14 ENCOUNTER — Ambulatory Visit (INDEPENDENT_AMBULATORY_CARE_PROVIDER_SITE_OTHER): Payer: Self-pay | Admitting: Family Medicine

## 2024-04-15 ENCOUNTER — Encounter (INDEPENDENT_AMBULATORY_CARE_PROVIDER_SITE_OTHER): Payer: Self-pay | Admitting: Family Medicine

## 2024-04-15 ENCOUNTER — Other Ambulatory Visit: Payer: Self-pay

## 2024-04-15 ENCOUNTER — Ambulatory Visit (INDEPENDENT_AMBULATORY_CARE_PROVIDER_SITE_OTHER): Admitting: Family Medicine

## 2024-04-15 VITALS — BP 135/78 | HR 78 | Temp 98.0°F | Resp 17 | Ht 67.5 in | Wt 294.5 lb

## 2024-04-15 DIAGNOSIS — Z23 Encounter for immunization: Secondary | ICD-10-CM

## 2024-04-15 DIAGNOSIS — Z1231 Encounter for screening mammogram for malignant neoplasm of breast: Secondary | ICD-10-CM

## 2024-04-15 DIAGNOSIS — F902 Attention-deficit hyperactivity disorder, combined type: Secondary | ICD-10-CM

## 2024-04-15 DIAGNOSIS — M25562 Pain in left knee: Secondary | ICD-10-CM

## 2024-04-15 DIAGNOSIS — Z0000 Encounter for general adult medical examination without abnormal findings: Secondary | ICD-10-CM

## 2024-04-15 DIAGNOSIS — Z124 Encounter for screening for malignant neoplasm of cervix: Secondary | ICD-10-CM

## 2024-04-15 DIAGNOSIS — F39 Unspecified mood [affective] disorder: Secondary | ICD-10-CM

## 2024-04-15 NOTE — Progress Notes (Signed)
 [facility]  [address]  [place] [address]  Phone: [phone]  Fax: [phone]    Encounter Date:  04/15/2024  Patient ID: Laura Barton   MRN: [id number]  DOB: 05/07/1976  Age: 48 y.o.      Subjective:     Chief Complaint:   Annual Exam (anxiety, depression, & ADHD)      Laura Barton is a 48 yo female presenting for annual exam.     HCM: Immunizations: flu and Hep B today; Pap smear: wants to see Amber in GYN; Mammogram: 07/26/22; Colon cancer screening: 10/12/23; HIV/Hep C screening: negative; Diet: Good. I eat mostly at the caf at work.; Exercise: Not good. Ever since the almost 48 year old is born. It is not good.     ADHD, anxiety, depression: She is currently on Wellbutrin  XL 150 mg qday and Buspar  7.5 mg BID PRN. I am heading steady right now. With worry with the cold weather coming. Anxiety has almost been terrible, but nothing has ever touched that. She has been doing therapy for the past 17 years. Buspar  is good if I absolutely need it. It makes me sleepy. It is like a chronic, all the time at a 9 anxiety.     PHQ Questionnaire  Little interest or pleasure in doing things.: Not at all  Feeling down, depressed, or hopeless: Not at all  PHQ 2 Total: 0  Trouble falling or staying asleep, or sleeping too much.: Not at all  Feeling tired or having little energy: Nearly every day  Poor appetite or overeating: Several Days  Feeling bad about yourself/ that you are a failure in the past 2 weeks?: Not at all  Trouble concentrating on things in the past 2 weeks?: Nearly every day  Moving/Speaking slowly or being fidgety or restless  in the past 2 weeks?: Not at all  Thoughts that you would be better off DEAD, or of hurting yourself in some way.: Not at all  If you checked off any problems, how difficult have these problems made it for you to do your work, take care of things at home, or get along with other people?: Somewhat difficult  PHQ 9 Total: 7  Interpretation of Total  Score: 5-9 Mild depression     GAD 7 Total Score  Gad-7 Score Total: 10  Interpretation: 10-14, moderate anxiety      Left knee pain: She reports falling on 03/19/24. Her knee gave out going down the stairs. She has been wearing a knee brace. As long as I keep it wrapped, I don't feel like it is going to give out. It hurts most whenever she stands up, and it gets better with movement.         Past Medical History:   Diagnosis Date    ADHD (attention deficit hyperactivity disorder)     Anxiety     Autism spectrum disorder     Depression     Hip pain     IIH (idiopathic intracranial hypertension)     Learning disability     Low back pain     Sleep apnea            Current Outpatient Medications   Medication Sig    buPROPion  (WELLBUTRIN  XL) 150 mg extended release 24 hr tablet Take 1 Tablet (150 mg total) by mouth Daily    busPIRone  (BUSPAR ) 7.5 mg Oral Tablet Take 1 Tablet (7.5 mg total) by mouth Twice per day as needed  Allergies[1]    Past Surgical History:   Procedure Laterality Date    ANKLE SURGERY Right     2000    GALLBLADDER SURGERY  2007    removal    HX GASTRIC SLEEVE      August 2019           Social History     Socioeconomic History    Marital status: Married   Tobacco Use    Smoking status: Never     Passive exposure: Never    Smokeless tobacco: Never   Vaping Use    Vaping status: Never Used   Substance and Sexual Activity    Alcohol use: Never    Drug use: Never    Sexual activity: Yes     Partners: Male, Female     Comment: spouse Transmasculine   Social History Narrative    No Hx of international travel        Family Medical History:       Problem Relation (Age of Onset)    Cancer Maternal Grandfather    Depression Sister    Drug Abuse Sister, Brother    Healthy Mother, Maternal Uncle, Maternal Grandmother              Review of Systems   Musculoskeletal:         Left knee pain and instability    Neurological:  Positive for headaches.         Objective:   Vitals: BP 135/78   Pulse 78   Temp  36.7 C (98 F) (Temporal)   Resp 17   Ht 1.715 m (5' 7.5)   Wt 134 kg (294 lb 8 oz)   SpO2 98%   BMI 45.44 kg/m       Physical Exam  Constitutional:       Appearance: She is obese.   Cardiovascular:      Rate and Rhythm: Normal rate and regular rhythm.   Pulmonary:      Effort: Pulmonary effort is normal. No respiratory distress.      Breath sounds: Normal breath sounds. No wheezing or rales.   Abdominal:      General: Bowel sounds are normal. There is no distension.      Tenderness: There is no abdominal tenderness. There is no guarding.   Musculoskeletal:      Comments: Left knee in brace, strength 4-5/5           Assessment & Plan:    Laura Barton is a 48 y.o. female presenting for annual exam.     (Z00.00) Annual physical exam  (primary encounter diagnosis)  Plan: [facility] PANEL (CMP,CBC/DIFF AND         TSH), LIPID PANEL           Health Maintenance   Topic Date Due    Pap smear/HPV  Never done    Covid-19 Vaccine (Shared decision making) (4 - 2025-26 season) 01/26/2024    Breast Cancer Screening  07/25/2024    NonMedicare Preventative Exam  04/15/2025    Tetanus-Diptheria Vaccines (2 - Td or Tdap) 06/19/2026    Fecal DNA Test (Cologuard)  10/12/2026    Hepatitis B Vaccine  Completed    Hepatitis C screening  Completed    HIV Screening  Completed    Influenza Vaccine  Completed        (F39) Mood disorder (CMS HCC) & (F90.2) Attention deficit hyperactivity disorder (ADHD), combined type  Plan: Continue Wellbutrin   XL 150 mg qday (highest tolerated dose) with Buspar  7.5 mg BID PRN.    Previous medication trials: Ritalin , Concerta , Klonopin, Xanax, Lithium (misdiagnosed), Lamictal, Abilify, Depakote, Prozac, & Zoloft     (Z12.31) Breast cancer screening by mammogram  Plan: MAMMO BILATERAL SCREENING-ADDL VIEWS/BREAST US          AS REQ BY RAD          (Z12.4) Cervical cancer screening  Plan: Refer to [facility]        Wants to see [facility]    (Z23) Need for immunization against  influenza  Plan: Flu Vaccine, 6 month-adult,0.5 mL IM (Admin)          (Z23) Need for vaccination against hepatitis B virus  Plan: HEP B VIRUS RECOMB VACCINE(ADMIN) ADULT          (M25.562) Left knee pain  Plan: XR KNEE LEFT 3 VIEW, Refer to Physical         Therapy-External         Will start with PT and Xray   She does not want to see ortho   If arthritis on Xray, can do steroid injection in clinic.       The patient has been educated and verbalized understanding the services provided during this visit.     RTC in 3 months for left knee pain (possible injection), ADHD, anxiety, & depression     KATIE Parag Dorton, MD 04/15/2024, 13:39              [1]   Allergies  Allergen Reactions    Latex Itching and Rash    Carbamazepine Rash    Hydrocodone-Acetaminophen Rash    Penicillins Swelling     Swelling in hands; states she still takes it as needed  Swelling in hands; states she still takes it as needed

## 2024-04-15 NOTE — Nursing Note (Signed)
 1. Are you 47 years of age or older? yes  2. Have you ever had a severe reaction to a flu shot? no  3. Are you allergic to latex? no  4. Are you allergic to Thimerosol? no  5. Are you experiencing acute illness symptoms or have you been running a fever? no  6. Do you have a medical condition or taking medications that suppress your immune system? no  7. Have you ever had Guillain-Barre syndrome or other neurologic disorder? no  8. Are you pregnant or breastfeeding? no    Immunization administered       Name Date Dose VIS Date Route    HEPATITIS B VIRUS RECOMB VACCINE 04/15/2024 1 mL 06/27/2023 Intramuscular    Site: Left deltoid    Given By: Rebecca Raisin, Ambulatory Care Assistant    Manufacturer: [employer]    Lot: [id number]    NDC: 41839917856    Influenza Vaccine, 6 month-adult 04/15/2024 0.5 mL 06/27/2023 Intramuscular    Site: Right deltoid    Given By: Rebecca Raisin, Ambulatory Care Assistant    Manufacturer: [employer]    Lot: [id number]    NDC: 41839908758          Vaccines given by Raisin Rebecca, MA    Vaccine verified by Ole Dayhoff, MA    Raisin Rebecca, Ambulatory Care Assistant 04/15/2024, 13:53

## 2024-04-16 ENCOUNTER — Other Ambulatory Visit (INDEPENDENT_AMBULATORY_CARE_PROVIDER_SITE_OTHER): Admitting: Radiology

## 2024-04-16 ENCOUNTER — Ambulatory Visit (INDEPENDENT_AMBULATORY_CARE_PROVIDER_SITE_OTHER): Payer: Self-pay | Admitting: Family Medicine

## 2024-04-16 ENCOUNTER — Other Ambulatory Visit (INDEPENDENT_AMBULATORY_CARE_PROVIDER_SITE_OTHER)

## 2024-04-16 ENCOUNTER — Ambulatory Visit: Attending: Family Medicine | Admitting: Family Medicine

## 2024-04-16 DIAGNOSIS — M1712 Unilateral primary osteoarthritis, left knee: Secondary | ICD-10-CM

## 2024-04-16 DIAGNOSIS — Z0000 Encounter for general adult medical examination without abnormal findings: Secondary | ICD-10-CM

## 2024-04-16 DIAGNOSIS — R718 Other abnormality of red blood cells: Secondary | ICD-10-CM

## 2024-04-16 DIAGNOSIS — M25562 Pain in left knee: Secondary | ICD-10-CM

## 2024-04-16 LAB — LIPID PANEL
CHOL/HDL RATIO: 4.2
CHOLESTEROL: 154 mg/dL (ref 100–200)
HDL CHOL: 37 mg/dL — ABNORMAL LOW (ref >=50)
LDL CALC: 91 mg/dL (ref <100)
NON-HDL: 117 mg/dL (ref <=190)
TRIGLYCERIDES: 148 mg/dL (ref <150)
VLDL CALC: 24 mg/dL (ref <30)

## 2024-04-16 LAB — CBC WITH DIFF
BASOPHIL #: <0.1 x10ˆ3/uL (ref <=0.20)
BASOPHIL %: 0.7 %
EOSINOPHIL #: 0.25 x10ˆ3/uL (ref <=0.50)
EOSINOPHIL %: 3 %
HCT: 38.6 % (ref 34.8–46.0)
HGB: 11.7 g/dL (ref 11.5–16.0)
IMMATURE GRANULOCYTE #: <0.1 x10ˆ3/uL (ref <0.10)
IMMATURE GRANULOCYTE %: 0.5 % (ref 0.0–1.0)
LYMPHOCYTE #: 1.99 x10ˆ3/uL (ref 1.00–4.80)
LYMPHOCYTE %: 24 %
MCH: 23.1 pg — ABNORMAL LOW (ref 26.0–32.0)
MCHC: 30.3 g/dL — ABNORMAL LOW (ref 31.0–35.5)
MCV: 76.1 fL — ABNORMAL LOW (ref 78.0–100.0)
MONOCYTE #: 0.39 x10ˆ3/uL (ref 0.20–1.10)
MONOCYTE %: 4.7 %
MPV: 12.3 fL (ref 8.7–12.5)
NEUTROPHIL #: 5.56 x10ˆ3/uL (ref 1.50–7.70)
NEUTROPHIL %: 67.1 %
PLATELETS: 238 x10ˆ3/uL (ref 150–400)
RBC: 5.07 x10ˆ6/uL (ref 3.85–5.22)
RDW-CV: 16 % — ABNORMAL HIGH (ref 11.5–15.5)
WBC: 8.3 x10ˆ3/uL (ref 3.7–11.0)

## 2024-04-16 LAB — COMPREHENSIVE METABOLIC PANEL, NON-FASTING
ALBUMIN: 3.7 g/dL (ref 3.5–5.0)
ALKALINE PHOSPHATASE: 92 U/L (ref 40–110)
ALT (SGPT): 16 U/L (ref <31)
ANION GAP: 11 mmol/L (ref 4–13)
AST (SGOT): 19 U/L (ref 11–34)
BILIRUBIN TOTAL: 1 mg/dL (ref 0.3–1.3)
BUN/CREA RATIO: 15 (ref 6–22)
BUN: 10 mg/dL (ref 8–25)
CALCIUM: 9.3 mg/dL (ref 8.6–10.2)
CHLORIDE: 105 mmol/L (ref 96–111)
CO2 TOTAL: 24 mmol/L (ref 22–30)
CREATININE: 0.65 mg/dL (ref 0.60–1.05)
GLUCOSE: 88 mg/dL (ref 65–125)
POTASSIUM: 4.2 mmol/L (ref 3.7–5.3)
PROTEIN TOTAL: 7.2 g/dL (ref 6.0–7.9)
SODIUM: 140 mmol/L (ref 136–145)
eGFRcr - FEMALE: >90 mL/min/1.73mˆ2 (ref >=60)

## 2024-04-16 LAB — THYROID STIMULATING HORMONE (SENSITIVE TSH): TSH: 0.911 u[IU]/mL (ref 0.350–4.940)

## 2024-04-19 ENCOUNTER — Encounter (INDEPENDENT_AMBULATORY_CARE_PROVIDER_SITE_OTHER): Payer: Self-pay

## 2024-05-11 ENCOUNTER — Ambulatory Visit (INDEPENDENT_AMBULATORY_CARE_PROVIDER_SITE_OTHER): Payer: Self-pay | Admitting: PHYSICIAN ASSISTANT

## 2024-05-25 ENCOUNTER — Other Ambulatory Visit: Payer: Self-pay

## 2024-05-25 ENCOUNTER — Ambulatory Visit (INDEPENDENT_AMBULATORY_CARE_PROVIDER_SITE_OTHER): Payer: Self-pay

## 2024-05-25 VITALS — BP 130/88 | HR 72 | Temp 98.1°F | Ht 68.0 in | Wt 291.9 lb

## 2024-05-25 DIAGNOSIS — M25562 Pain in left knee: Secondary | ICD-10-CM

## 2024-05-25 NOTE — Progress Notes (Signed)
 Laura Barton  DOB: 1975/08/12, 48 y.o.   MRN: [id number]  Date of encounter: 05/25/2024    CHIEF COMPLAINT:   Knee Pain      SUBJECTIVE:  History of Present Illness    Laura Barton is a 48 year old female with arthritis who presents with left knee pain exacerbated by physical therapy.    Left knee pain  - Sharp pain localized to the lateral aspect of the left knee, radiating to the hip  - Pain is distinct from baseline arthritis pain  - Current pain severity is mild (2/10) with rest  - Pain was significantly worse following physical therapy sessions  - No swelling or redness in the knee  - Onset of pain after a fall on October 25th in [place], when the knee gave out while descending stairs  - Physical therapy over two weeks (four visits) exacerbated pain and caused muscle spasms  - Heating pad provides some relief  - Knee brace provides support and is helpful  - Arthritis confirmed by x-ray  - Physical therapist noted concern about diagnosis and recommended MRI as pain in on the lateral side of knee    Pain management limitations  - History of sleeve gastrectomy limits pain management options to Tylenol  - Previously used muscle relaxers for pain relief but has not taken them recently             09/05/2023     1:11 PM 01/13/2024     8:29 AM 04/15/2024     1:30 PM   Most Recent PHQ-9 Scores   PHQ 9 Total 7 5 7               04/05/2022     3:00 PM 04/05/2022     4:39 PM 04/08/2023    10:24 AM 06/13/2023    10:25 AM 09/05/2023     1:12 PM 01/13/2024     8:31 AM 04/15/2024     1:31 PM   Most Recent GAD-7 Scores   Gad-7 Score Total 7 4 5 10 10 8 10           Past Medical History:   Diagnosis Date    ADHD (attention deficit hyperactivity disorder)     Anxiety     Autism spectrum disorder     Depression     Hip pain     IIH (idiopathic intracranial hypertension)     Learning disability     Low back pain     Sleep apnea            Allergies[1]    Current Outpatient Medications   Medication Sig    buPROPion  (WELLBUTRIN  XL) 150 mg  extended release 24 hr tablet Take 1 Tablet (150 mg total) by mouth Daily    busPIRone  (BUSPAR ) 7.5 mg Oral Tablet Take 1 Tablet (7.5 mg total) by mouth Twice per day as needed       Social History     Socioeconomic History    Marital status: Married   Tobacco Use    Smoking status: Never     Passive exposure: Never    Smokeless tobacco: Never   Vaping Use    Vaping status: Never Used   Substance and Sexual Activity    Alcohol use: Never    Drug use: Never    Sexual activity: Yes     Partners: Male, Female     Comment: spouse Transmasculine   Social History Narrative    No Hx of  international travel           OBJECTIVE:    BP 130/88 (Patient Position: Sitting)   Pulse 72   Temp 36.7 C (98.1 F)   Ht 1.727 m (5' 8)   Wt 132 kg (291 lb 14.4 oz)   SpO2 94%   BMI 44.38 kg/m       Physical Exam    GENERAL: Alert, cooperative, well developed, no acute distress.  CHEST: Clear to auscultation bilaterally, no wheeze, rhonchi or crackles.  CARDIOVASCULAR: Normal heart rate and rhythm, S1 and S2 normal, no murmurs.  NEUROLOGICAL: Cranial nerves grossly intact, moves all extremities without gross motor or sensory deficit.  MUSCULOSKELETAL: L Knee examined, no swelling, redness or tenderness of L knee joint noted. ROM intact  Pt reports pain on the lateral aspect of the L knee joint (above & below the L knee joint), consistent with IT band syndrome         RESULTS:  Results    Radiology  Left knee X-ray: Osteoarthritic changes         ASSESSMENT/PLAN:  Assessment & Plan     Left knee pain and iliotibial band syndrome  Sharp, deep lateral knee pain extending to hip, likely iliotibial band syndrome. MRI needed for further evaluation, requires specialist referral.  - Referred to orthopedic specialist for MRI evaluation and further management.  - Continue using knee brace for support.  - Take Tylenol for pain management.  - Continue PT    Left knee osteoarthritis  X-ray confirmed osteoarthritis. Discussed potential  corticosteroid injection but pain in on the lateral side of knee joint (above & below the L knee joint) more consistent IT band syndrome.  - Continue current pain management with Tylenol.               ICD-10-CM    1. Left knee pain  M25.562 Referral to ORTHOPAEDICS/SPORTS MEDICINE - [facility] - P.ALASKY, C.COURTNEY, DAHL, DANHIRES, BEOLA MUNROE, KNOX BOLD, [facility]                      The patient was given ample opportunity to ask questions and those questions were answered to the patient's satisfaction. The patient was encouraged to be involved in their own care, and all diagnoses, medications, and medication side-effects were discussed. The patient was told to contact me with any additional questions or concerns, and to go to the emergency department in an emergency.     This note was created with assistance from [employer] via capture of conversational audio.  Consent was obtained from the patient prior to recording.      The patient has been educated and verbalized understanding regarding the services provided during this visit.  Return if symptoms worsen or fail to improve.    Laura Schwalbe, MD  05/25/2024, 09:29           [1]   Allergies  Allergen Reactions    Latex Itching and Rash    Carbamazepine Rash    Hydrocodone-Acetaminophen Rash    Penicillins Swelling     Swelling in hands; states she still takes it as needed  Swelling in hands; states she still takes it as needed

## 2024-06-02 ENCOUNTER — Ambulatory Visit (INDEPENDENT_AMBULATORY_CARE_PROVIDER_SITE_OTHER): Payer: Self-pay | Admitting: Mammography

## 2024-06-08 ENCOUNTER — Ambulatory Visit (INDEPENDENT_AMBULATORY_CARE_PROVIDER_SITE_OTHER): Admitting: PHYSICIAN ASSISTANT

## 2024-06-08 ENCOUNTER — Encounter (INDEPENDENT_AMBULATORY_CARE_PROVIDER_SITE_OTHER): Payer: Self-pay | Admitting: Family Medicine

## 2024-06-11 ENCOUNTER — Encounter (INDEPENDENT_AMBULATORY_CARE_PROVIDER_SITE_OTHER): Payer: Self-pay | Admitting: Family Medicine

## 2024-06-11 ENCOUNTER — Encounter (INDEPENDENT_AMBULATORY_CARE_PROVIDER_SITE_OTHER): Payer: Self-pay

## 2024-06-15 ENCOUNTER — Ambulatory Visit (HOSPITAL_BASED_OUTPATIENT_CLINIC_OR_DEPARTMENT_OTHER): Payer: Self-pay | Admitting: Physician Assistant

## 2024-06-21 ENCOUNTER — Ambulatory Visit (INDEPENDENT_AMBULATORY_CARE_PROVIDER_SITE_OTHER): Admitting: Mammography

## 2024-06-24 ENCOUNTER — Ambulatory Visit (HOSPITAL_BASED_OUTPATIENT_CLINIC_OR_DEPARTMENT_OTHER): Admitting: Physician Assistant

## 2024-06-25 ENCOUNTER — Ambulatory Visit (INDEPENDENT_AMBULATORY_CARE_PROVIDER_SITE_OTHER): Admitting: Mammography

## 2024-07-08 ENCOUNTER — Ambulatory Visit (INDEPENDENT_AMBULATORY_CARE_PROVIDER_SITE_OTHER): Admitting: PHYSICIAN ASSISTANT

## 2024-08-06 ENCOUNTER — Encounter (INDEPENDENT_AMBULATORY_CARE_PROVIDER_SITE_OTHER): Payer: Self-pay | Admitting: Family Medicine
